# Patient Record
Sex: Female | Born: 1960 | Race: White | Hispanic: No | Marital: Married | Smoking: Former smoker
Health system: Southern US, Community
[De-identification: ages and names within clinical notes are randomized; demographics above are authoritative.]

## PROBLEM LIST (undated history)

## (undated) DIAGNOSIS — Z8489 Family history of other specified conditions: Secondary | ICD-10-CM

## (undated) DIAGNOSIS — T7840XA Allergy, unspecified, initial encounter: Secondary | ICD-10-CM

## (undated) DIAGNOSIS — I10 Essential (primary) hypertension: Secondary | ICD-10-CM

## (undated) DIAGNOSIS — N183 Chronic kidney disease, stage 3 unspecified: Secondary | ICD-10-CM

## (undated) DIAGNOSIS — R7303 Prediabetes: Secondary | ICD-10-CM

## (undated) DIAGNOSIS — M25539 Pain in unspecified wrist: Secondary | ICD-10-CM

## (undated) DIAGNOSIS — G4730 Sleep apnea, unspecified: Secondary | ICD-10-CM

## (undated) DIAGNOSIS — K219 Gastro-esophageal reflux disease without esophagitis: Secondary | ICD-10-CM

## (undated) DIAGNOSIS — M797 Fibromyalgia: Secondary | ICD-10-CM

## (undated) HISTORY — DX: Allergy, unspecified, initial encounter: T78.40XA

## (undated) HISTORY — DX: Pain in unspecified wrist: M25.539

## (undated) HISTORY — PX: FOOT SURGERY: SHX648

## (undated) HISTORY — PX: ECTOPIC PREGNANCY SURGERY: SHX613

## (undated) HISTORY — PX: WISDOM TOOTH EXTRACTION: SHX21

## (undated) HISTORY — PX: BREAST BIOPSY: SHX20

---

## 2014-03-17 ENCOUNTER — Ambulatory Visit: Payer: Self-pay | Admitting: Gastroenterology

## 2014-03-24 ENCOUNTER — Ambulatory Visit: Payer: Self-pay | Admitting: Family Medicine

## 2014-07-16 ENCOUNTER — Ambulatory Visit
Admission: RE | Admit: 2014-07-16 | Discharge: 2014-07-16 | Disposition: A | Discharge status: Home / Self Care | Payer: Commercial Indemnity | Source: Ambulatory Visit | Attending: Family Medicine | Admitting: Family Medicine

## 2014-07-16 ENCOUNTER — Ambulatory Visit (INDEPENDENT_AMBULATORY_CARE_PROVIDER_SITE_OTHER): Payer: Managed Care, Other (non HMO) | Admitting: Family Medicine

## 2014-07-16 ENCOUNTER — Ambulatory Visit
Admission: RE | Admit: 2014-07-16 | Discharge: 2014-07-16 | Disposition: A | Discharge status: Home / Self Care | Payer: Commercial Indemnity | Source: Other Acute Inpatient Hospital | Attending: Family Medicine | Admitting: Family Medicine

## 2014-07-16 ENCOUNTER — Encounter: Payer: Self-pay | Admitting: Family Medicine

## 2014-07-16 VITALS — BP 114/73 | HR 78 | Temp 98.9°F | Resp 16 | Ht 64.0 in | Wt 206.8 lb

## 2014-07-16 DIAGNOSIS — M25532 Pain in left wrist: Secondary | ICD-10-CM | POA: Diagnosis not present

## 2014-07-16 DIAGNOSIS — M797 Fibromyalgia: Secondary | ICD-10-CM | POA: Diagnosis not present

## 2014-07-16 DIAGNOSIS — G8929 Other chronic pain: Secondary | ICD-10-CM

## 2014-07-16 DIAGNOSIS — G4700 Insomnia, unspecified: Secondary | ICD-10-CM | POA: Insufficient documentation

## 2014-07-16 DIAGNOSIS — I10 Essential (primary) hypertension: Secondary | ICD-10-CM | POA: Diagnosis not present

## 2014-07-16 DIAGNOSIS — F32A Depression, unspecified: Secondary | ICD-10-CM

## 2014-07-16 DIAGNOSIS — F329 Major depressive disorder, single episode, unspecified: Secondary | ICD-10-CM

## 2014-07-16 DIAGNOSIS — I129 Hypertensive chronic kidney disease with stage 1 through stage 4 chronic kidney disease, or unspecified chronic kidney disease: Secondary | ICD-10-CM | POA: Insufficient documentation

## 2014-07-16 DIAGNOSIS — F339 Major depressive disorder, recurrent, unspecified: Secondary | ICD-10-CM | POA: Insufficient documentation

## 2014-07-16 DIAGNOSIS — N183 Chronic kidney disease, stage 3 unspecified: Secondary | ICD-10-CM | POA: Insufficient documentation

## 2014-07-16 DIAGNOSIS — F3341 Major depressive disorder, recurrent, in partial remission: Secondary | ICD-10-CM | POA: Insufficient documentation

## 2014-07-16 MED ORDER — TRAZODONE HCL 50 MG PO TABS: 25.0000 mg | ORAL_TABLET | Freq: Every evening | ORAL | Status: DC | PRN

## 2014-07-16 NOTE — Progress Notes (Signed)
Name: Rachel Townsend   MRN: [id number]    DOB: December 17, 1960   Date:07/16/2014       Progress Note  Subjective  Chief Complaint  Chief Complaint  Patient presents with  . Wrist Pain    Left side    HPI   L. Wrist pain for 2 months.  Minimal trauma (turning hand).  Pain beneath MC-C joint. Feels better with splint, but still bothers her.    Past Medical History  Diagnosis Date  . Allergy     History  Substance Use Topics  . Smoking status: Not on file  . Smokeless tobacco: Not on file  . Alcohol Use: Not on file     Current outpatient prescriptions:  .  baclofen (LIORESAL) 10 MG tablet, Take 10 mg by mouth 3 (three) times daily., Disp: , Rfl:  .  buPROPion (WELLBUTRIN XL) 300 MG 24 hr tablet, , Disp: , Rfl:  .  etodolac (LODINE XL) 500 MG 24 hr tablet, , Disp: , Rfl:  .  lisinopril (PRINIVIL,ZESTRIL) 5 MG tablet, , Disp: , Rfl:  .  sertraline (ZOLOFT) 100 MG tablet, Take 100 mg by mouth daily. Take 1 and 1/2 tablets each day., Disp: , Rfl:  .  traZODone (DESYREL) 50 MG tablet, Take 0.5-1 tablets (25-50 mg total) by mouth at bedtime as needed for sleep., Disp: 30 tablet, Rfl: 3  No Known Allergies  Review of Systems  Constitutional: Negative.  Negative for fever and chills.  Eyes: Negative.   Respiratory: Negative.  Negative for cough, sputum production, shortness of breath and wheezing.   Cardiovascular: Negative.  Negative for chest pain, palpitations, orthopnea, claudication and leg swelling.  Gastrointestinal: Negative.  Negative for heartburn, nausea, vomiting, abdominal pain and diarrhea.  Musculoskeletal: Positive for myalgias and joint pain.  Neurological: Headaches: /-  Psychiatric/Behavioral: The patient has insomnia.       Objective  Filed Vitals:   07/16/14 1526  BP: 114/73  Pulse: 78  Temp: 98.9 F (37.2 C)  TempSrc: Oral  Resp: 16  Height: 5\' 4"  (1.626 m)  Weight: 206 lb 12.8 oz (93.804 kg)     Physical Exam  Constitutional: She is  well-developed, well-nourished, and in no distress. No distress.  Musculoskeletal:       Right wrist: She exhibits tenderness and bony tenderness.       Arms: Vitals reviewed.       Assessment & Plan  1. Fibromyalgia Continue Etodolac and Baclofen   2. Depression Continue Zoloft and Wellbutrin     4. Essential hypertension Continue Liosinopril     3. Insomnia  - traZODone (DESYREL) 50 MG tablet; Take 0.5-1 tablets (25-50 mg total) by mouth at bedtime as needed for sleep.  Dispense: 30 tablet; Refill: 3     5. Wrist pain, chronic, left  - DG Wrist Complete Left; Future  Ice wrist 2-3 times a day Continue to wear Spica splint of wrist.

## 2014-07-17 ENCOUNTER — Telehealth: Payer: Self-pay | Admitting: Family Medicine

## 2014-07-17 NOTE — Telephone Encounter (Signed)
Pt called for results of wrist xray.

## 2014-07-18 NOTE — Progress Notes (Signed)
She may do what she wishes.  The Ortho referral is my recommendation.  She can discuss with me at future visit if she wishes.-jh

## 2014-07-23 NOTE — Telephone Encounter (Signed)
This encounter was created in error - please disregard.

## 2014-08-19 ENCOUNTER — Telehealth: Payer: Self-pay

## 2014-08-19 NOTE — Telephone Encounter (Signed)
Patient seen a few weeks ago and given a brace and told she will need to see Ortho. She said she will wear brace and see how it goes. She has now hurt the wrist by hitting it on something by accident and would like that referral to Ortho now. Is this ok and where do you think we should send her.

## 2014-08-19 NOTE — Telephone Encounter (Signed)
Yes, go ahead and refer her to [facility] ortho for both the knee and wrist.-jh

## 2014-08-22 NOTE — Telephone Encounter (Signed)
Will refer to [facility]. Advised patient they will call her.[facility]

## 2014-08-25 NOTE — Telephone Encounter (Signed)
This encounter was created in error - please disregard.

## 2014-08-26 NOTE — Telephone Encounter (Signed)
Done/JH 

## 2014-08-31 ENCOUNTER — Other Ambulatory Visit: Payer: Self-pay | Admitting: Family Medicine

## 2014-09-26 ENCOUNTER — Other Ambulatory Visit: Payer: Self-pay

## 2014-09-26 ENCOUNTER — Telehealth: Payer: Self-pay

## 2014-09-26 DIAGNOSIS — E785 Hyperlipidemia, unspecified: Secondary | ICD-10-CM

## 2014-09-26 MED ORDER — ATORVASTATIN CALCIUM 20 MG PO TABS: 20.0000 mg | ORAL_TABLET | Freq: Every day | ORAL | Status: DC

## 2014-09-26 NOTE — Telephone Encounter (Signed)
Advised that I sent 30 day RX with year refill to [facility] for Atorvastatin and patient requesting that after she pick that up we send 90 days in to [facility]. She will call and leave me VM as to when I can send it so she can get the first month with no hassle. She also is requesting that we send Etoldilac Rx refills now to [facility] so she can get that when she picks chol. Med up. She takes twice daily. Please send in or I will to [facility] 30 day only. Thanks

## 2014-09-26 NOTE — Telephone Encounter (Signed)
OK to send this to [facility].  This going back and forth between pharmacies is getting too complicated.-jh

## 2014-09-26 NOTE — Telephone Encounter (Deleted)
Was able to speak to patient and advised Rx was sent to Pharmacy. She said she

## 2014-09-26 NOTE — Telephone Encounter (Deleted)
Called to advise patient that I have sent in cholesterol med. Left detaile

## 2014-09-29 MED ORDER — ATORVASTATIN CALCIUM 20 MG PO TABS: 20.0000 mg | ORAL_TABLET | Freq: Every day | ORAL | Status: DC

## 2014-09-29 MED ORDER — ETODOLAC ER 500 MG PO TB24: 500.0000 mg | ORAL_TABLET | Freq: Every day | ORAL | Status: DC | PRN

## 2014-09-29 NOTE — Telephone Encounter (Signed)
Sent 90 days to mail order and sent 30 days Etoldilac to [facility]

## 2014-10-08 ENCOUNTER — Telehealth: Payer: Self-pay | Admitting: Family Medicine

## 2014-10-08 NOTE — Telephone Encounter (Signed)
Pt said a prescription for etodolac was sent to [facility].  It was only a 30 day supply and it needs to be 90 day supply.  The pharmacy told her someone could call [phone] to change it instead of sending a new prescription.

## 2014-10-08 NOTE — Telephone Encounter (Signed)
Changed to 90 day supply of 180 tabs. Called [facility]. [facility]

## 2014-10-27 ENCOUNTER — Other Ambulatory Visit: Payer: Self-pay | Admitting: Family Medicine

## 2014-11-24 ENCOUNTER — Ambulatory Visit (INDEPENDENT_AMBULATORY_CARE_PROVIDER_SITE_OTHER): Payer: Managed Care, Other (non HMO) | Admitting: Family Medicine

## 2014-11-24 ENCOUNTER — Encounter: Payer: Self-pay | Admitting: Family Medicine

## 2014-11-24 VITALS — BP 106/71 | HR 67 | Resp 16 | Ht 63.0 in | Wt 209.6 lb

## 2014-11-24 DIAGNOSIS — I10 Essential (primary) hypertension: Secondary | ICD-10-CM | POA: Diagnosis not present

## 2014-11-24 DIAGNOSIS — E669 Obesity, unspecified: Secondary | ICD-10-CM

## 2014-11-24 DIAGNOSIS — Z0000 Encounter for general adult medical examination without abnormal findings: Secondary | ICD-10-CM | POA: Diagnosis not present

## 2014-11-24 DIAGNOSIS — M797 Fibromyalgia: Secondary | ICD-10-CM

## 2014-11-24 DIAGNOSIS — G4700 Insomnia, unspecified: Secondary | ICD-10-CM | POA: Diagnosis not present

## 2014-11-24 DIAGNOSIS — F329 Major depressive disorder, single episode, unspecified: Secondary | ICD-10-CM | POA: Diagnosis not present

## 2014-11-24 DIAGNOSIS — F32A Depression, unspecified: Secondary | ICD-10-CM

## 2014-11-24 DIAGNOSIS — Z23 Encounter for immunization: Secondary | ICD-10-CM

## 2014-11-24 NOTE — Patient Instructions (Signed)
Fpem signed [facility].

## 2014-11-24 NOTE — Progress Notes (Signed)
Name: Rachel Townsend   MRN: [id number]    DOB: 06-06-1960   Date:11/24/2014       Progress Note  Subjective  Chief Complaint  Chief Complaint  Patient presents with  . Weight Check    [facility] Paper     HPI Here for evaluation re: weight and health insurance rates.   Has maintained weight over past year after a 25 pound loss from 2013.  Has fibromyalgia and depression. No problem-specific assessment & plan notes found for this encounter.   Past Medical History  Diagnosis Date  . Allergy   . Wrist pain     Social History  Substance Use Topics  . Smoking status: Former Smoker    Quit date: 11/23/1984  . Smokeless tobacco: Never Used  . Alcohol Use: 6.0 oz/week    10 Glasses of wine per week     Current outpatient prescriptions:  .  atorvastatin (LIPITOR) 20 MG tablet, Take 1 tablet (20 mg total) by mouth daily., Disp: 90 tablet, Rfl: 3 .  baclofen (LIORESAL) 10 MG tablet, Take 10 mg by mouth 3 (three) times daily., Disp: , Rfl:  .  Biotin 1 MG CAPS, Take by mouth., Disp: , Rfl:  .  buPROPion (WELLBUTRIN XL) 300 MG 24 hr tablet, Take 1 tablet by mouth  daily, Disp: 90 tablet, Rfl: 1 .  Calcium-Vitamin D 600-200 MG-UNIT tablet, Take by mouth., Disp: , Rfl:  .  etodolac (LODINE XL) 500 MG 24 hr tablet, Take 1 tablet (500 mg total) by mouth daily as needed., Disp: 60 tablet, Rfl: 0 .  FOLIC ACID PO, Take by mouth., Disp: , Rfl:  .  lisinopril (PRINIVIL,ZESTRIL) 5 MG tablet, , Disp: , Rfl:  .  loratadine (CLARITIN) 10 MG tablet, Take by mouth., Disp: , Rfl:  .  magnesium citrate (V-R MAGNESIUM CITRATE) SOLN, Take by mouth., Disp: , Rfl:  .  NIACIN CR PO, Take by mouth., Disp: , Rfl:  .  Omega-3 Fatty Acids (FISH OIL) 1000 MG CAPS, Take by mouth., Disp: , Rfl:  .  sertraline (ZOLOFT) 100 MG tablet, Take 100 mg by mouth daily. Take 1 and 1/2 tablets each day., Disp: , Rfl:  .  traZODone (DESYREL) 50 MG tablet, Take 0.5-1 tablets (25-50 mg total) by mouth at bedtime as needed for  sleep., Disp: 30 tablet, Rfl: 3  No Known Allergies  Review of Systems  Constitutional: Positive for malaise/fatigue. Negative for fever, chills and weight loss.  HENT: Negative for hearing loss.   Eyes: Negative for blurred vision and double vision.  Respiratory: Negative for cough, shortness of breath and wheezing.   Cardiovascular: Negative for chest pain, palpitations, orthopnea and leg swelling.  Gastrointestinal: Negative for heartburn, abdominal pain and blood in stool.  Genitourinary: Negative for dysuria, urgency and frequency.  Musculoskeletal: Positive for myalgias.  Neurological: Negative for weakness and headaches.      Objective  Filed Vitals:   11/24/14 1316  BP: 106/71  Pulse: 67  Resp: 16  Height: 5\' 3"  (1.6 m)  Weight: 209 lb 9.6 oz (95.074 kg)     Physical Exam  Constitutional: She is well-developed, well-nourished, and in no distress. No distress.  HENT:  Head: Normocephalic and atraumatic.  Neck: Normal range of motion. Neck supple. Carotid bruit is not present. No thyromegaly present.  Cardiovascular: Normal rate, regular rhythm, normal heart sounds and intact distal pulses.  Exam reveals no gallop and no friction rub.   No murmur heard. Pulmonary/Chest: Effort normal  and breath sounds normal. No respiratory distress. She has no wheezes. She has no rales.  Abdominal: Soft. Bowel sounds are normal. She exhibits no distension and no mass. There is no tenderness.  Musculoskeletal: She exhibits no edema.  Lymphadenopathy:    She has no cervical adenopathy.  Vitals reviewed.     No results found for this or any previous visit (from the past 2160 hour(s)).   Assessment & Plan  1. Periodic health assessment, general screening, adult   2. Need for influenza vaccination  - Flu Vaccine QUAD 36+ mos PF IM (Fluarix & Fluzone Quad PF)  3. Need for Tdap vaccination  - Tdap vaccine greater than or equal to 7yo IM  4. Essential hypertension   5.  Fibromyalgia   6. Depression   7. Insomnia   8. Obesity  - Amb ref to Medical Nutrition Therapy-[facility]

## 2014-12-22 ENCOUNTER — Encounter: Payer: Commercial Indemnity | Attending: Family Medicine | Admitting: Dietician

## 2014-12-22 VITALS — Ht 64.0 in | Wt 211.6 lb

## 2014-12-22 DIAGNOSIS — M797 Fibromyalgia: Secondary | ICD-10-CM

## 2014-12-22 DIAGNOSIS — E669 Obesity, unspecified: Secondary | ICD-10-CM | POA: Diagnosis present

## 2014-12-22 DIAGNOSIS — F32A Depression, unspecified: Secondary | ICD-10-CM

## 2014-12-22 DIAGNOSIS — F329 Major depressive disorder, single episode, unspecified: Secondary | ICD-10-CM

## 2014-12-22 DIAGNOSIS — I10 Essential (primary) hypertension: Secondary | ICD-10-CM

## 2014-12-22 NOTE — Progress Notes (Signed)
Medical Nutrition Therapy: Visit start time: 1400  end time: 1500  Assessment:  Diagnosis: obesity Past medical history: HTN, hyperlipidemia, sleep apnea, fibromyalgia and arthritis Psychosocial issues/ stress concerns: history of depression Preferred learning method:  . Auditory . Visual  Current weight: 211.6lbs  Height: 5'3" Medications, supplements: reviewed list in chart with patient  Progress and evaluation: Patient reports struggling to manage weight since middle school, has participated in Weight Watchers several times, starting in 6th grade.          She and her husband have worked on [employer] recently.          She reports knowing what to do in terms of healthy eating, but feeling lack of motivation recently.         Physical activity is difficult due to pain  Physical activity: none, some activity at work, does most housework, husband unable physically  Dietary Intake:  Usual eating pattern includes 3 meals and 1-2 snacks per day. Dining out frequency: 5-10 meals per week.  Breakfast: coffee 1/2pot half-caff with sweet-n-low (4pks total). 2pcs [place] sausage on sandwich thin; or steak and egg biscuit, diet dr. 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    Prepares meat and vegetable meal for husband when he gets home 8pm, but she is too hungry to wait until then to eat.   Snack: none Beverages: water, diet drinks, drinks plenty of fluids per patient  Nutrition Care Education: Topics covered: weight management Basic nutrition: appropriate nutrient balance, general nutrition guidelines    Weight control:  behavioral changes for weight loss, 1300kcal meal plan  with 50% CHO, 20% protein, 30% fat; importance of low fat and low sugar foods, portion control Hypertension and hyperlipidemia, inflammation:  identifying food sources of potassium, magnesium, phytochemicals: importance of increasing veg and fruit intake Other lifestyle changes: increasing motivation, readiness for change  Nutritional Diagnosis:  Alexander-3.3 Overweight/obesity As related to excess calorie intake, inactivity related to pain, depression.  As evidenced by patient report.  Intervention: Instruction as noted above.   Encouraged patient to find a motivator such as reduced inflammation to make diet changes.   Did set some goals with patient input   She declined scheduling follow-up at this time, wants to work on depression and motivation for now.         Education Materials given:  . Food lists/ Planning A Balanced Meal . Goals/ instructions  Learner/ who was taught:  . Patient    Level of understanding: Marland Kitchen Verbalizes/ demonstrates competency  Demonstrated degree of understanding via:   Teach back Learning barriers: . None   Willingness to learn/ readiness for change: . Hesitance, contemplating change   Monitoring and Evaluation:  Dietary intake, exercise, pain, and body weight      follow up: prn

## 2014-12-22 NOTE — Patient Instructions (Signed)
   Choose salads some days for lunches.  Prepare [place] sandwiches to take to work, along with fruits and celery, carrots/ other veggies.   Water is the healthiest drink, followed by tea and low-caff coffee, flavored waters. Avoid regular sodas and limit diet sodas to 1-2 per day.

## 2014-12-30 ENCOUNTER — Other Ambulatory Visit: Payer: Self-pay | Admitting: Family Medicine

## 2015-03-02 ENCOUNTER — Encounter: Payer: Self-pay | Admitting: Family Medicine

## 2015-03-02 ENCOUNTER — Ambulatory Visit (INDEPENDENT_AMBULATORY_CARE_PROVIDER_SITE_OTHER): Payer: Managed Care, Other (non HMO) | Admitting: Family Medicine

## 2015-03-02 VITALS — BP 127/85 | HR 76 | Temp 98.3°F | Resp 16 | Ht 63.0 in | Wt 210.8 lb

## 2015-03-02 DIAGNOSIS — F329 Major depressive disorder, single episode, unspecified: Secondary | ICD-10-CM

## 2015-03-02 DIAGNOSIS — I10 Essential (primary) hypertension: Secondary | ICD-10-CM | POA: Diagnosis not present

## 2015-03-02 DIAGNOSIS — E669 Obesity, unspecified: Secondary | ICD-10-CM

## 2015-03-02 DIAGNOSIS — M797 Fibromyalgia: Secondary | ICD-10-CM

## 2015-03-02 DIAGNOSIS — F32A Depression, unspecified: Secondary | ICD-10-CM

## 2015-03-02 MED ORDER — SERTRALINE HCL 100 MG PO TABS: ORAL_TABLET | ORAL | Status: DC

## 2015-03-02 NOTE — Patient Instructions (Signed)
Plan to cont. To decrease Zoloft if it helps weight.

## 2015-03-02 NOTE — Progress Notes (Signed)
Name: Rachel Townsend   MRN: [id number]    DOB: Aug 28, 1960   Date:03/02/2015       Progress Note  Subjective  Chief Complaint  Chief Complaint  Patient presents with  . Weight Check    HPI Here to discuss weight.  [facility] but did not feel that it did any good.  Holidays and extra stress at home and work making things harder for her to ty to diet.  She does not know that she feels any better on 150 of Zoloft than she did on 100.    No problem-specific assessment & plan notes found for this encounter.   Past Medical History  Diagnosis Date  . Allergy   . Wrist pain     Past Surgical History  Procedure Laterality Date  . Ectopic pregnancy surgery    . Foot surgery      Family History  Problem Relation Age of Onset  . Cancer Mother     breast cancer  . Hypertension Mother   . Diabetes Mother   . Hyperlipidemia Mother   . Hypertension Father   . Hyperlipidemia Father     Social History   Social History  . Marital Status: Married    Spouse Name: N/A  . Number of Children: N/A  . Years of Education: N/A   Occupational History  . Not on file.   Social History Main Topics  . Smoking status: Former Smoker    Quit date: 11/23/1984  . Smokeless tobacco: Never Used  . Alcohol Use: 6.0 oz/week    10 Glasses of wine per week  . Drug Use: No  . Sexual Activity: Yes    Birth Control/ Protection: Injection   Other Topics Concern  . Not on file   Social History Narrative     Current outpatient prescriptions:  .  atorvastatin (LIPITOR) 20 MG tablet, Take 1 tablet (20 mg total) by mouth daily., Disp: 90 tablet, Rfl: 3 .  b complex vitamins tablet, Take 1 tablet by mouth daily., Disp: , Rfl:  .  Biotin 1 MG CAPS, Take by mouth., Disp: , Rfl:  .  buPROPion (WELLBUTRIN XL) 300 MG 24 hr tablet, Take 1 tablet by mouth  daily, Disp: 90 tablet, Rfl: 1 .  Calcium-Vitamin D 600-200 MG-UNIT tablet, Take 1 tablet by mouth 2 (two)  times daily. , Disp: , Rfl:  .  Cholecalciferol (VITAMIN D-1000 MAX ST) 1000 units tablet, Take 1,000 Units by mouth daily., Disp: , Rfl:  .  co-enzyme Q-10 50 MG capsule, Take 100 mg by mouth daily., Disp: , Rfl:  .  etodolac (LODINE XL) 500 MG 24 hr tablet, Take 1 tablet (500 mg total) by mouth daily as needed., Disp: 60 tablet, Rfl: 0 .  FOLIC ACID PO, Take 1 tablet by mouth daily. , Disp: , Rfl:  .  lisinopril (PRINIVIL,ZESTRIL) 5 MG tablet, Take 5 mg by mouth daily. , Disp: , Rfl:  .  loratadine (CLARITIN) 10 MG tablet, Take 10 mg by mouth daily. , Disp: , Rfl:  .  NIACIN CR PO, Take 1 tablet by mouth daily. , Disp: , Rfl:  .  Omega-3 Fatty Acids (FISH OIL) 1000 MG CAPS, Take 1,200 mg by mouth daily. , Disp: , Rfl:  .  sertraline (ZOLOFT) 100 MG tablet, Take 1 tablet in the AM each day, Disp: 90 tablet, Rfl: 3 .  baclofen (LIORESAL) 10 MG tablet, Take 10 mg by mouth 3 (  three) times daily. Reported on 03/02/2015, Disp: , Rfl:  .  traZODone (DESYREL) 50 MG tablet, Take 0.5-1 tablets (25-50 mg total) by mouth at bedtime as needed for sleep. (Patient not taking: Reported on 12/22/2014), Disp: 30 tablet, Rfl: 3  Not on File   Review of Systems  Constitutional: Negative for fever, chills, weight loss and malaise/fatigue.  HENT: Negative for hearing loss.   Eyes: Negative for blurred vision and double vision.  Respiratory: Negative for cough, shortness of breath and wheezing.   Cardiovascular: Negative for chest pain, palpitations and leg swelling.  Gastrointestinal: Negative for heartburn, abdominal pain and blood in stool.  Genitourinary: Negative for dysuria, urgency and frequency.  Musculoskeletal: Positive for myalgias (fibromyalgia doing fairly well at present.).  Skin: Negative for rash.  Neurological: Negative for tremors, weakness and headaches.  Psychiatric/Behavioral: Positive for depression. The patient is nervous/anxious and has insomnia (off and on.).        Objective  Filed Vitals:   03/02/15 1552  BP: 127/85  Pulse: 76  Temp: 98.3 F (36.8 C)  TempSrc: Oral  Resp: 16  Height: 5\' 3"  (1.6 m)  Weight: 210 lb 12.8 oz (95.618 kg)    Physical Exam  Constitutional: She is oriented to person, place, and time and well-developed, well-nourished, and in no distress. No distress.  HENT:  Head: Normocephalic and atraumatic.  Eyes: Conjunctivae and EOM are normal. Pupils are equal, round, and reactive to light. No scleral icterus.  Neck: Normal range of motion. Neck supple. Carotid bruit is not present. No thyromegaly present.  Cardiovascular: Normal rate, regular rhythm and normal heart sounds.  Exam reveals no gallop and no friction rub.   No murmur heard. Pulmonary/Chest: Effort normal and breath sounds normal. No respiratory distress. She has no wheezes. She has no rales.  Musculoskeletal: She exhibits no edema.  Lymphadenopathy:    She has no cervical adenopathy.  Neurological: She is alert and oriented to person, place, and time.  Multip(le fibromyalgia trigger points still tender.  Psychiatric:  Affect is stable  Vitals reviewed.      No results found for this or any previous visit (from the past 2160 hour(s)).   Assessment & Plan  Problem List Items Addressed This Visit      Cardiovascular and Mediastinum   Hypertension     Musculoskeletal and Integument   Fibromyalgia   Relevant Medications   sertraline (ZOLOFT) 100 MG tablet     Other   Depression   Relevant Medications   sertraline (ZOLOFT) 100 MG tablet   Obesity - Primary      Meds ordered this encounter  Medications  . Cholecalciferol (VITAMIN D-1000 MAX ST) 1000 units tablet    Sig: Take 1,000 Units by mouth daily.  Marland Kitchen DISCONTD: Calcium Carbonate-Vitamin D 600-400 MG-UNIT tablet    Sig: Take 1 tablet by mouth daily. Reported on 03/02/2015  . DISCONTD: Multiple Vitamins-Minerals (B COMPLEX PLUS VITAMIN C PO)    Sig: Take 1 tablet by mouth daily.  Reported on 03/02/2015  . sertraline (ZOLOFT) 100 MG tablet    Sig: Take 1 tablet in the AM each day    Dispense:  90 tablet    Refill:  3   1. Obesity Decreased Zoloft from 150 mg/d to 100 mg/d.  2. Fibromyalgia  - sertraline (ZOLOFT) 100 MG tablet; Take 1 tablet in the AM each day  Dispense: 90 tablet; Refill: 3  (dose lower)  3. Depression  - sertraline (ZOLOFT) 100 MG tablet;  Take 1 tablet in the AM each day  Dispense: 90 tablet; Refill: 3  (dose lower)  4. Essential hypertension Cont. med

## 2015-04-13 ENCOUNTER — Ambulatory Visit (INDEPENDENT_AMBULATORY_CARE_PROVIDER_SITE_OTHER): Payer: Managed Care, Other (non HMO) | Admitting: Family Medicine

## 2015-04-13 ENCOUNTER — Encounter: Payer: Self-pay | Admitting: Family Medicine

## 2015-04-13 VITALS — BP 107/66 | HR 82 | Resp 16 | Ht 63.0 in | Wt 205.0 lb

## 2015-04-13 DIAGNOSIS — M797 Fibromyalgia: Secondary | ICD-10-CM | POA: Diagnosis not present

## 2015-04-13 DIAGNOSIS — G4700 Insomnia, unspecified: Secondary | ICD-10-CM

## 2015-04-13 DIAGNOSIS — F329 Major depressive disorder, single episode, unspecified: Secondary | ICD-10-CM | POA: Diagnosis not present

## 2015-04-13 DIAGNOSIS — F32A Depression, unspecified: Secondary | ICD-10-CM

## 2015-04-13 MED ORDER — GABAPENTIN 100 MG PO CAPS: ORAL_CAPSULE | ORAL | Status: DC

## 2015-04-13 NOTE — Patient Instructions (Signed)
Take 1-2, 81 mg. ASA tabs, 1/2 hr before taking Niacin at night.

## 2015-04-13 NOTE — Progress Notes (Signed)
Name: Rachel Townsend   MRN: [id number]    DOB: 1960-05-12   Date:04/13/2015       Progress Note  Subjective  Chief Complaint  Chief Complaint  Patient presents with  . Muscle Pain  . Hot Flashes    HPI Here for f/u of fibromyalgia pain.  Hurts "all over" at this time.  Feels weak in arms and legs.  Throbbing pain in feet, legs, arms.   Also with hot flashes at night.  But takes Niacin at night without ASA previous.    No problem-specific assessment & plan notes found for this encounter.   Past Medical History  Diagnosis Date  . Allergy   . Wrist pain     Past Surgical History  Procedure Laterality Date  . Ectopic pregnancy surgery    . Foot surgery      Family History  Problem Relation Age of Onset  . Cancer Mother     breast cancer  . Hypertension Mother   . Diabetes Mother   . Hyperlipidemia Mother   . Hypertension Father   . Hyperlipidemia Father     Social History   Social History  . Marital Status: Married    Spouse Name: N/A  . Number of Children: N/A  . Years of Education: N/A   Occupational History  . Not on file.   Social History Main Topics  . Smoking status: Former Smoker    Quit date: 11/23/1984  . Smokeless tobacco: Never Used  . Alcohol Use: 6.0 oz/week    10 Glasses of wine per week  . Drug Use: No  . Sexual Activity: Yes    Birth Control/ Protection: Injection   Other Topics Concern  . Not on file   Social History Narrative     Current outpatient prescriptions:  .  Acetaminophen (TYLENOL ARTHRITIS PAIN PO), Take by mouth., Disp: , Rfl:  .  atorvastatin (LIPITOR) 20 MG tablet, Take 1 tablet (20 mg total) by mouth daily., Disp: 90 tablet, Rfl: 3 .  b complex vitamins tablet, Take 1 tablet by mouth daily., Disp: , Rfl:  .  Biotin 1 MG CAPS, Take by mouth., Disp: , Rfl:  .  buPROPion (WELLBUTRIN XL) 300 MG 24 hr tablet, , Disp: , Rfl:  .  Calcium-Vitamin D 600-200 MG-UNIT tablet, Take 1 tablet by mouth 2 (two) times daily. , Disp: ,  Rfl:  .  Cholecalciferol (VITAMIN D-1000 MAX ST) 1000 units tablet, Take 1,000 Units by mouth daily., Disp: , Rfl:  .  co-enzyme Q-10 50 MG capsule, Take 100 mg by mouth daily., Disp: , Rfl:  .  diphenhydramine-acetaminophen (TYLENOL PM) 25-500 MG TABS tablet, Take 1 tablet by mouth at bedtime as needed., Disp: , Rfl:  .  etodolac (LODINE XL) 500 MG 24 hr tablet, Take 1 tablet (500 mg total) by mouth daily as needed., Disp: 60 tablet, Rfl: 0 .  FOLIC ACID PO, Take 1 tablet by mouth daily. , Disp: , Rfl:  .  lisinopril (PRINIVIL,ZESTRIL) 5 MG tablet, Take 5 mg by mouth daily. , Disp: , Rfl:  .  loratadine (CLARITIN) 10 MG tablet, Take 10 mg by mouth daily. , Disp: , Rfl:  .  magnesium citrate SOLN, Take 1 Bottle by mouth once., Disp: , Rfl:  .  NIACIN CR PO, Take 1 tablet by mouth daily. , Disp: , Rfl:  .  Omega-3 Fatty Acids (FISH OIL) 1000 MG CAPS, Take 1,200 mg by mouth daily. , Disp: , Rfl:  .  sertraline (ZOLOFT) 100 MG tablet, Take 1 tablet in the AM each day, Disp: 90 tablet, Rfl: 3 .  gabapentin (NEURONTIN) 100 MG capsule, Take 3 capsules three times a day, Disp: 270 capsule, Rfl: 6  Not on File   Review of Systems  Constitutional: Negative for fever, chills, weight loss and malaise/fatigue.  HENT: Negative for hearing loss.   Eyes: Negative for blurred vision and double vision.  Respiratory: Negative for cough, shortness of breath and wheezing.   Cardiovascular: Negative for chest pain, palpitations and leg swelling.  Gastrointestinal: Negative for heartburn, abdominal pain and constipation.  Genitourinary: Negative for dysuria, urgency and frequency.  Musculoskeletal: Positive for myalgias.  Skin: Negative for rash.  Neurological: Negative for dizziness, tremors, weakness and headaches.  Psychiatric/Behavioral: Positive for depression. The patient has insomnia.       Objective  Filed Vitals:   04/13/15 1320  BP: 107/66  Pulse: 82  Resp: 16  Height: 5\' 3"  (1.6 m)   Weight: 205 lb (92.987 kg)    Physical Exam  Constitutional: She is oriented to person, place, and time and well-developed, well-nourished, and in no distress. No distress.  HENT:  Head: Normocephalic and atraumatic.  Eyes: Conjunctivae are normal. Pupils are equal, round, and reactive to light. No scleral icterus.  Neck: Normal range of motion. Neck supple. Carotid bruit is not present. No thyromegaly present.  Cardiovascular: Normal rate, regular rhythm and normal heart sounds.  Exam reveals no gallop and no friction rub.   No murmur heard. Pulmonary/Chest: Effort normal and breath sounds normal. No respiratory distress. She has no wheezes. She has no rales.  Musculoskeletal: She exhibits no edema.  Trigger point pain to all trigger point locales.  Moves very slowly .  Lymphadenopathy:    She has no cervical adenopathy.  Neurological: She is alert and oriented to person, place, and time.  Vitals reviewed.      No results found for this or any previous visit (from the past 2160 hour(s)).   Assessment & Plan  Problem List Items Addressed This Visit      Musculoskeletal and Integument   Fibromyalgia - Primary   Relevant Medications   gabapentin (NEURONTIN) 100 MG capsule     Other   Depression   Relevant Medications   buPROPion (WELLBUTRIN XL) 300 MG 24 hr tablet   Insomnia      Meds ordered this encounter  Medications  . magnesium citrate SOLN    Sig: Take 1 Bottle by mouth once.  . Acetaminophen (TYLENOL ARTHRITIS PAIN PO)    Sig: Take by mouth.  . diphenhydramine-acetaminophen (TYLENOL PM) 25-500 MG TABS tablet    Sig: Take 1 tablet by mouth at bedtime as needed.  Marland Kitchen buPROPion (WELLBUTRIN XL) 300 MG 24 hr tablet    Sig:   . gabapentin (NEURONTIN) 100 MG capsule    Sig: Take 3 capsules three times a day    Dispense:  270 capsule    Refill:  6   1. Fibromyalgia  - gabapentin (NEURONTIN) 100 MG capsule; Take 3 capsules three times a day  Dispense: 270  capsule; Refill: 6  2. Depression Cont meds  3. Insomnia

## 2015-04-20 ENCOUNTER — Ambulatory Visit
Admission: EM | Admit: 2015-04-20 | Discharge: 2015-04-20 | Disposition: A | Discharge status: Home / Self Care | Payer: Managed Care, Other (non HMO) | Attending: Emergency Medicine | Admitting: Emergency Medicine

## 2015-04-20 DIAGNOSIS — M5441 Lumbago with sciatica, right side: Secondary | ICD-10-CM

## 2015-04-20 HISTORY — DX: Essential (primary) hypertension: I10

## 2015-04-20 HISTORY — DX: Fibromyalgia: M79.7

## 2015-04-20 MED ORDER — METHYLPREDNISOLONE SODIUM SUCC 125 MG IJ SOLR
125.0000 mg | Freq: Once | INTRAMUSCULAR | Status: AC
  Administered 2015-04-20: 125 mg via INTRAMUSCULAR

## 2015-04-20 MED ORDER — METAXALONE 800 MG PO TABS: 800.0000 mg | ORAL_TABLET | Freq: Three times a day (TID) | ORAL | Status: DC

## 2015-04-20 NOTE — ED Notes (Signed)
Reporting right side mostly sciatica pain.  Started about a week ago.  Prescribed meds and OTCs are not cutting pain at moment.  Here for further.

## 2015-04-20 NOTE — ED Provider Notes (Signed)
CSN: [id number]     Arrival date & time 04/20/15  1025 History   First MD Initiated Contact with Patient 04/20/15 1233     Chief Complaint  Patient presents with  . Sciatica   (Consider location/radiation/quality/duration/timing/severity/associated sxs/prior Treatment) HPI   This a 55 year old female who presents with right-sided sciatica that started approximately 1 week ago. This is been a chronic problem for her and she has been using etodolac and Flexeril without much relief. 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 She has to drive the plan to [facility] facility she is barely able to walk when she exits the [place]. This is been a very chronic problem for her. She has no incontinence and has no loss of function.  Past Medical History  Diagnosis Date  . Allergy   . Wrist pain   . Fibromyalgia   . Hypertension    Past Surgical History  Procedure Laterality Date  . Ectopic pregnancy surgery    . Foot surgery     Family History  Problem Relation Age of Onset  . Cancer Mother     breast cancer  . Hypertension Mother   . Diabetes Mother   . Hyperlipidemia Mother   . Hypertension Father   . Hyperlipidemia Father    Social History  Substance Use Topics  . Smoking status: Former Smoker    Quit date: 11/23/1984  . Smokeless tobacco: Never Used  . Alcohol Use: 6.0 oz/week    10 Glasses of wine per week   OB History    No data available     Review of Systems  Constitutional: Positive for activity change. Negative for fever, chills and fatigue.  Musculoskeletal: Positive for myalgias and back pain.  All other systems reviewed and are negative.   Allergies  Review of patient's allergies indicates no known allergies.  Home Medications   Prior to Admission medications   Medication Sig Start Date End Date Taking? Authorizing Provider  Acetaminophen  (TYLENOL ARTHRITIS PAIN PO) Take by mouth.    Historical Provider, MD  atorvastatin (LIPITOR) 20 MG tablet Take 1 tablet (20 mg total) by mouth daily. 09/29/14   Arlis Porta., MD  b complex vitamins tablet Take 1 tablet by mouth daily.    Historical Provider, MD  Biotin 1 MG CAPS Take by mouth.    Historical Provider, MD  buPROPion (WELLBUTRIN XL) 300 MG 24 hr tablet  03/06/15   Historical Provider, MD  Calcium-Vitamin D 600-200 MG-UNIT tablet Take 1 tablet by mouth 2 (two) times daily.     Historical Provider, MD  Cholecalciferol (VITAMIN D-1000 MAX ST) 1000 units tablet Take 1,000 Units by mouth daily.    Historical Provider, MD  co-enzyme Q-10 50 MG capsule Take 100 mg by mouth daily.    Historical Provider, MD  diphenhydramine-acetaminophen (TYLENOL PM) 25-500 MG TABS tablet Take 1 tablet by mouth at bedtime as needed.    Historical Provider, MD  etodolac (LODINE XL) 500 MG 24 hr tablet Take 1 tablet (500 mg total) by mouth daily as needed. 09/29/14   Arlis Porta., MD  FOLIC ACID PO Take 1 tablet by mouth daily.     Historical Provider, MD  gabapentin (NEURONTIN) 100 MG capsule Take 3 capsules three times a day 04/13/15   Arlis Porta., MD  lisinopril (PRINIVIL,ZESTRIL) 5  MG tablet Take 5 mg by mouth daily.  04/14/14   Historical Provider, MD  loratadine (CLARITIN) 10 MG tablet Take 10 mg by mouth daily.     Historical Provider, MD  magnesium citrate SOLN Take 1 Bottle by mouth once.    Historical Provider, MD  metaxalone (SKELAXIN) 800 MG tablet Take 1 tablet (800 mg total) by mouth 3 (three) times daily. 04/20/15   Lorin Picket, PA-C  NIACIN CR PO Take 1 tablet by mouth daily.     Historical Provider, MD  Omega-3 Fatty Acids (FISH OIL) 1000 MG CAPS Take 1,200 mg by mouth daily.     Historical Provider, MD  sertraline (ZOLOFT) 100 MG tablet Take 1 tablet in the AM each day 03/02/15   Arlis Porta., MD   Meds Ordered and Administered this Visit   Medications   methylPREDNISolone sodium succinate (SOLU-MEDROL) 125 mg/2 mL injection 125 mg (125 mg Intramuscular Given 04/20/15 1314)    BP 137/90 mmHg  Pulse 82  Temp(Src) 98.1 F (36.7 C) (Oral)  Resp 16  SpO2 100%  LMP  (Approximate) No data found.   Physical Exam  Constitutional: She is oriented to person, place, and time. She appears well-developed and well-nourished. No distress.  HENT:  Head: Normocephalic and atraumatic.  Eyes: Conjunctivae are normal. Pupils are equal, round, and reactive to light.  Neck: Normal range of motion. Neck supple.  Musculoskeletal:  Examination of lumbar spine shows a level pelvis in stance. Forward flexion and lateral flexion are fluid. He is able to toe and heel walk adequately. Sensation is intact to light touch in the lower extremities. DTRs are 2+ over 4 and bilaterally symmetrical at the knee and ankles. There is no clonus present leg raise testing is negative at 90 bilaterally but on the right patient has complaints of lateral calf posterior thigh and right buttock pain. EHL peroneal and anterior tibialis are strong to clinical testing.  Neurological: She is alert and oriented to person, place, and time. She has normal reflexes. She displays normal reflexes. She exhibits normal muscle tone. Coordination normal.  Skin: Skin is warm and dry. She is not diaphoretic.  Psychiatric: She has a normal mood and affect. Her behavior is normal. Judgment and thought content normal.  Nursing note and vitals reviewed.   ED Course  Procedures (including critical care time)  Labs Review Labs Reviewed - No data to display  Imaging Review No results found.   Visual Acuity Review  Right Eye Distance:   Left Eye Distance:   Bilateral Distance:    Right Eye Near:   Left Eye Near:    Bilateral Near:      13:14 Medication Given GB  methylPREDNISolone sodium succinate (SOLU-MEDROL) 125 mg/2 mL injection 125 mg - Dose: 125 mg ; Route: Intramuscular ; Site:  Right Ventrogluteal ; Scheduled Time: 13        MDM   1. Right-sided low back pain with right-sided sciatica    New Prescriptions   METAXALONE (SKELAXIN) 800 MG TABLET    Take 1 tablet (800 mg total) by mouth 3 (three) times daily.  Plan: 1. Test/x-ray results and diagnosis reviewed with patient 2. rx as per orders; risks, benefits, potential side effects reviewed with patient 3. Recommend supportive treatment with Rest and symptom avoidance. I've explained the sitting lifting and bending are her worst activities. We will try to substitute Skelaxin for the Flexeril as a better parent forms muscle relaxer. Continue. Etodolac. She should  follow-up with her primary care physician as necessary. 4. F/u prn if symptoms worsen or don't improve     Lorin Picket, PA-C 04/20/15 1320

## 2015-04-20 NOTE — ED Notes (Signed)
Ambulatory to treatment room.  Gait normal.  In NAD.

## 2015-04-20 NOTE — Discharge Instructions (Signed)

## 2015-04-24 ENCOUNTER — Other Ambulatory Visit: Payer: Self-pay | Admitting: Family Medicine

## 2015-05-14 ENCOUNTER — Ambulatory Visit (INDEPENDENT_AMBULATORY_CARE_PROVIDER_SITE_OTHER): Payer: Managed Care, Other (non HMO) | Admitting: Family Medicine

## 2015-05-14 ENCOUNTER — Encounter: Payer: Self-pay | Admitting: Family Medicine

## 2015-05-14 VITALS — BP 117/77 | HR 80 | Temp 98.6°F | Resp 16 | Ht 64.0 in | Wt 203.0 lb

## 2015-05-14 DIAGNOSIS — M797 Fibromyalgia: Secondary | ICD-10-CM

## 2015-05-14 DIAGNOSIS — M5430 Sciatica, unspecified side: Secondary | ICD-10-CM | POA: Insufficient documentation

## 2015-05-14 DIAGNOSIS — M5431 Sciatica, right side: Secondary | ICD-10-CM

## 2015-05-14 MED ORDER — METAXALONE 800 MG PO TABS: 800.0000 mg | ORAL_TABLET | Freq: Three times a day (TID) | ORAL | Status: DC

## 2015-05-14 MED ORDER — GABAPENTIN 400 MG PO CAPS: ORAL_CAPSULE | ORAL | Status: DC

## 2015-05-14 NOTE — Progress Notes (Signed)
Name: Rachel Townsend   MRN: [id number]    DOB: Jun 01, 1960   Date:05/14/2015       Progress Note  Subjective  Chief Complaint  Chief Complaint  Patient presents with  . Leg Pain    right    Leg Pain    Here c/o R leg pain on and off for 1 month.  Can hurt with walklng or with laying down.  Can hurt with sleep also.  Worse with minimal trauma (mis-stepped).    Pain is located in R buttock and radiates sown R leg to heel.  Gabapentin has helped it feel better, bujt still worse on and off.  No loss of bowel or bladder controil.  No foot drop  No problem-specific assessment & plan notes found for this encounter.   Past Medical History  Diagnosis Date  . Allergy   . Wrist pain   . Fibromyalgia   . Hypertension     Social History  Substance Use Topics  . Smoking status: Former Smoker    Quit date: 11/23/1984  . Smokeless tobacco: Never Used  . Alcohol Use: 6.0 oz/week    10 Glasses of wine per week     Current outpatient prescriptions:  .  Acetaminophen (TYLENOL ARTHRITIS PAIN PO), Take by mouth., Disp: , Rfl:  .  atorvastatin (LIPITOR) 20 MG tablet, Take 1 tablet (20 mg total) by mouth daily., Disp: 90 tablet, Rfl: 3 .  b complex vitamins tablet, Take 1 tablet by mouth daily., Disp: , Rfl:  .  Biotin 1 MG CAPS, Take by mouth., Disp: , Rfl:  .  buPROPion (WELLBUTRIN XL) 300 MG 24 hr tablet, Take 1 tablet by mouth  daily, Disp: 90 tablet, Rfl: 3 .  Calcium-Vitamin D 600-200 MG-UNIT tablet, Take 1 tablet by mouth 2 (two) times daily. , Disp: , Rfl:  .  Cholecalciferol (VITAMIN D-1000 MAX ST) 1000 units tablet, Take 1,000 Units by mouth daily., Disp: , Rfl:  .  co-enzyme Q-10 50 MG capsule, Take 100 mg by mouth daily., Disp: , Rfl:  .  diphenhydramine-acetaminophen (TYLENOL PM) 25-500 MG TABS tablet, Take 1 tablet by mouth at bedtime as needed., Disp: , Rfl:  .  etodolac (LODINE XL) 500 MG 24 hr tablet, Take 1 tablet (500 mg total) by mouth daily as needed., Disp: 60 tablet,  Rfl: 0 .  FOLIC ACID PO, Take 1 tablet by mouth daily. , Disp: , Rfl:  .  gabapentin (NEURONTIN) 100 MG capsule, Take 3 capsules three times a day, Disp: 270 capsule, Rfl: 6 .  lisinopril (PRINIVIL,ZESTRIL) 5 MG tablet, Take 1 tablet by mouth  daily, Disp: 90 tablet, Rfl: 3 .  loratadine (CLARITIN) 10 MG tablet, Take 10 mg by mouth daily. , Disp: , Rfl:  .  magnesium citrate SOLN, Take 1 Bottle by mouth once., Disp: , Rfl:  .  metaxalone (SKELAXIN) 800 MG tablet, Take 1 tablet (800 mg total) by mouth 3 (three) times daily., Disp: 21 tablet, Rfl: 0 .  NIACIN CR PO, Take 1 tablet by mouth daily. , Disp: , Rfl:  .  Omega-3 Fatty Acids (FISH OIL) 1000 MG CAPS, Take 1,200 mg by mouth daily. , Disp: , Rfl:  .  sertraline (ZOLOFT) 100 MG tablet, Take 1 tablet in the AM each day, Disp: 90 tablet, Rfl: 3  Not on File  Review of Systems  Constitutional: Negative for fever, chills, weight loss and malaise/fatigue.  HENT: Negative for hearing loss.   Eyes: Negative for  blurred vision and double vision.  Respiratory: Negative for cough, shortness of breath and wheezing.   Cardiovascular: Negative for chest pain, palpitations and leg swelling.  Gastrointestinal: Negative for heartburn, abdominal pain and blood in stool.  Genitourinary: Negative for dysuria, urgency and frequency.  Musculoskeletal: Positive for back pain.       R leg pain  Skin: Negative for rash.  Neurological: Negative for weakness and headaches.       Radiating pain down R leg.      Objective  Filed Vitals:   05/14/15 1540  BP: 117/77  Pulse: 80  Temp: 98.6 F (37 C)  TempSrc: Oral  Resp: 16  Height: 5\' 4"  (1.626 m)  Weight: 203 lb (92.08 kg)     Physical Exam  Constitutional: She is well-developed, well-nourished, and in no distress. No distress.  HENT:  Head: Normocephalic and atraumatic.  Neck: Normal range of motion. Neck supple. Carotid bruit is not present. No thyromegaly present.  Cardiovascular: Normal  rate, regular rhythm and normal heart sounds.   Pulmonary/Chest: Effort normal and breath sounds normal.  Musculoskeletal:  Discomfort over sacral spine and intop R buttock.  R post. Thigh and post lower l;eg ten der to p(alp now.  No swelling of R leg com(pared to L.  + R straight leg raising test.  Lymphadenopathy:    She has no cervical adenopathy.  Vitals reviewed.     No results found for this or any previous visit (from the past 2160 hour(s)).   Assessment & Plan  1. Sciatica of right side Cont. Etodolac - metaxalone (SKELAXIN) 800 MG tablet; Take 1 tablet (800 mg total) by mouth 3 (three) times daily.  Dispense: 30 tablet; Refill: 2 - gabapentin (NEURONTIN) 400 MG capsule; Take 1 capsule three times a day.  Dispense: 90 capsule; Refill: 6 - Ambulatory referral to Physical Therapy  2. Fibromyalgia  - gabapentin (NEURONTIN) 400 MG capsule; Take 1 capsule three times a day.  Dispense: 90 capsule; Refill: 6

## 2015-05-20 ENCOUNTER — Other Ambulatory Visit: Payer: Self-pay | Admitting: Family Medicine

## 2015-06-15 ENCOUNTER — Ambulatory Visit: Payer: Managed Care, Other (non HMO) | Admitting: Family Medicine

## 2015-07-06 ENCOUNTER — Ambulatory Visit (INDEPENDENT_AMBULATORY_CARE_PROVIDER_SITE_OTHER): Payer: Managed Care, Other (non HMO) | Admitting: Family Medicine

## 2015-07-06 ENCOUNTER — Encounter: Payer: Self-pay | Admitting: Family Medicine

## 2015-07-06 VITALS — BP 119/80 | HR 83 | Temp 98.9°F | Resp 16 | Ht 64.0 in | Wt 212.0 lb

## 2015-07-06 DIAGNOSIS — F32A Depression, unspecified: Secondary | ICD-10-CM

## 2015-07-06 DIAGNOSIS — M797 Fibromyalgia: Secondary | ICD-10-CM

## 2015-07-06 DIAGNOSIS — M5431 Sciatica, right side: Secondary | ICD-10-CM | POA: Diagnosis not present

## 2015-07-06 DIAGNOSIS — F329 Major depressive disorder, single episode, unspecified: Secondary | ICD-10-CM | POA: Diagnosis not present

## 2015-07-06 DIAGNOSIS — I10 Essential (primary) hypertension: Secondary | ICD-10-CM

## 2015-07-06 MED ORDER — GABAPENTIN 600 MG PO TABS: 600.0000 mg | ORAL_TABLET | Freq: Three times a day (TID) | ORAL | Status: DC

## 2015-07-06 NOTE — Progress Notes (Signed)
Name: Rachel Townsend   MRN: [id number]    DOB: [birth year]   Date:07/06/2015       Progress Note  Subjective  Chief Complaint  Chief Complaint  Patient presents with  . Fibromyalgia    HPI Here for f/u of fibromyalgia.  She is doing fair.  Her fibromyalgia seemed to do a little better on higher dose of Gabapentin until weather fronts started coming through.  No problem-specific assessment & plan notes found for this encounter.   Past Medical History  Diagnosis Date  . Allergy   . Wrist pain   . Fibromyalgia   . Hypertension     Past Surgical History  Procedure Laterality Date  . Ectopic pregnancy surgery    . Foot surgery      Family History  Problem Relation Age of Onset  . Cancer Mother     breast cancer  . Hypertension Mother   . Diabetes Mother   . Hyperlipidemia Mother   . Hypertension Father   . Hyperlipidemia Father     Social History   Social History  . Marital Status: Married    Spouse Name: N/A  . Number of Children: N/A  . Years of Education: N/A   Occupational History  . Not on file.   Social History Main Topics  . Smoking status: Former Smoker    Quit date: 11/23/1984  . Smokeless tobacco: Never Used  . Alcohol Use: 6.0 oz/week    10 Glasses of wine per week  . Drug Use: No  . Sexual Activity: Yes    Birth Control/ Protection: Injection   Other Topics Concern  . Not on file   Social History Narrative     Current outpatient prescriptions:  .  Acetaminophen (TYLENOL ARTHRITIS PAIN PO), Take by mouth., Disp: , Rfl:  .  atorvastatin (LIPITOR) 20 MG tablet, Take 1 tablet by mouth  daily, Disp: 90 tablet, Rfl: 3 .  b complex vitamins tablet, Take 1 tablet by mouth daily., Disp: , Rfl:  .  Biotin 1 MG CAPS, Take by mouth., Disp: , Rfl:  .  buPROPion (WELLBUTRIN XL) 300 MG 24 hr tablet, Take 1 tablet by mouth  daily, Disp: 90 tablet, Rfl: 3 .  Calcium-Vitamin D 600-200 MG-UNIT tablet, Take 1 tablet by mouth 2 (two) times daily. , Disp: ,  Rfl:  .  Cholecalciferol (VITAMIN D-1000 MAX ST) 1000 units tablet, Take 1,000 Units by mouth daily., Disp: , Rfl:  .  co-enzyme Q-10 50 MG capsule, Take 100 mg by mouth daily., Disp: , Rfl:  .  diphenhydramine-acetaminophen (TYLENOL PM) 25-500 MG TABS tablet, Take 1 tablet by mouth at bedtime as needed., Disp: , Rfl:  .  etodolac (LODINE XL) 500 MG 24 hr tablet, Take 1 tablet (500 mg total) by mouth daily as needed. (Patient taking differently: Take 500 mg by mouth 2 (two) times daily. ), Disp: 60 tablet, Rfl: 0 .  FOLIC ACID PO, Take 1 tablet by mouth daily. , Disp: , Rfl:  .  gabapentin (NEURONTIN) 600 MG tablet, Take 1 tablet (600 mg total) by mouth 3 (three) times daily., Disp: 90 tablet, Rfl: 6 .  lisinopril (PRINIVIL,ZESTRIL) 5 MG tablet, Take 1 tablet by mouth  daily, Disp: 90 tablet, Rfl: 3 .  loratadine (CLARITIN) 10 MG tablet, Take 10 mg by mouth daily. , Disp: , Rfl:  .  magnesium citrate SOLN, Take 1 Bottle by mouth once., Disp: , Rfl:  .  NIACIN CR PO, Take 1  tablet by mouth daily. , Disp: , Rfl:  .  Omega-3 Fatty Acids (FISH OIL) 1000 MG CAPS, Take 1,200 mg by mouth daily. , Disp: , Rfl:  .  sertraline (ZOLOFT) 100 MG tablet, Take 1 tablet in the AM each day, Disp: 90 tablet, Rfl: 3  Not on File   Review of Systems  Constitutional: Negative for fever, chills, weight loss and malaise/fatigue.  HENT: Negative for hearing loss.   Eyes: Negative for blurred vision and double vision.  Respiratory: Negative for cough, shortness of breath and wheezing.   Cardiovascular: Negative for chest pain, palpitations and leg swelling.  Gastrointestinal: Negative for heartburn, abdominal pain and blood in stool.  Genitourinary: Negative for dysuria, urgency and frequency.  Musculoskeletal: Positive for myalgias. Negative for joint pain.  Skin: Negative for rash.  Neurological: Negative for tremors, weakness and headaches.      Objective  Filed Vitals:   07/06/15 1432  BP: 119/80   Pulse: 83  Temp: 98.9 F (37.2 C)  TempSrc: Oral  Resp: 16  Height: 5\' 4"  (1.626 m)  Weight: 212 lb (96.163 kg)    Physical Exam  Constitutional: She is oriented to person, place, and time and well-developed, well-nourished, and in no distress. No distress.  HENT:  Head: Normocephalic and atraumatic.  Eyes: Conjunctivae and EOM are normal. Pupils are equal, round, and reactive to light. No scleral icterus.  Neck: Normal range of motion. Neck supple. Carotid bruit is not present. Thyromegaly present.  Cardiovascular: Normal rate, regular rhythm and normal heart sounds.  Exam reveals no gallop and no friction rub.   No murmur heard. Pulmonary/Chest: Effort normal and breath sounds normal. No respiratory distress. She has no wheezes. She has no rales.  Musculoskeletal: She exhibits no edema.  Lymphadenopathy:    She has no cervical adenopathy.  Neurological: She is alert and oriented to person, place, and time.       No results found for this or any previous visit (from the past 2160 hour(s)).   Assessment & Plan  Problem List Items Addressed This Visit      Cardiovascular and Mediastinum   Hypertension     Nervous and Auditory   Sciatica   Relevant Medications   gabapentin (NEURONTIN) 600 MG tablet     Musculoskeletal and Integument   Fibromyalgia - Primary   Relevant Medications   gabapentin (NEURONTIN) 600 MG tablet     Other   Depression      Meds ordered this encounter  Medications  . gabapentin (NEURONTIN) 600 MG tablet    Sig: Take 1 tablet (600 mg total) by mouth 3 (three) times daily.    Dispense:  90 tablet    Refill:  6   1. Fibromyalgia  - gabapentin (NEURONTIN) 600 MG tablet; Take 1 tablet (600 mg total) by mouth 3 (three) times daily.  Dispense: 90 tablet; Refill: 6  2. Essential hypertension   3. Depression   4. Sciatica of right side  Continue all other meds at current doses.

## 2015-08-17 ENCOUNTER — Ambulatory Visit (INDEPENDENT_AMBULATORY_CARE_PROVIDER_SITE_OTHER): Payer: Managed Care, Other (non HMO) | Admitting: Family Medicine

## 2015-08-17 ENCOUNTER — Encounter: Payer: Self-pay | Admitting: Family Medicine

## 2015-08-17 VITALS — BP 122/79 | HR 78 | Temp 98.0°F | Resp 16 | Ht 64.0 in | Wt 213.0 lb

## 2015-08-17 DIAGNOSIS — M797 Fibromyalgia: Secondary | ICD-10-CM

## 2015-08-17 DIAGNOSIS — F329 Major depressive disorder, single episode, unspecified: Secondary | ICD-10-CM

## 2015-08-17 DIAGNOSIS — R5382 Chronic fatigue, unspecified: Secondary | ICD-10-CM | POA: Insufficient documentation

## 2015-08-17 DIAGNOSIS — F32A Depression, unspecified: Secondary | ICD-10-CM

## 2015-08-17 DIAGNOSIS — G4700 Insomnia, unspecified: Secondary | ICD-10-CM | POA: Diagnosis not present

## 2015-08-17 DIAGNOSIS — I10 Essential (primary) hypertension: Secondary | ICD-10-CM

## 2015-08-17 MED ORDER — GABAPENTIN 600 MG PO TABS: 600.0000 mg | ORAL_TABLET | Freq: Three times a day (TID) | ORAL | Status: DC

## 2015-08-17 NOTE — Progress Notes (Signed)
Name: Rachel Townsend   MRN: [id number]    DOB: Apr 11, 1960   Date:08/17/2015       Progress Note  Subjective  Chief Complaint  Chief Complaint  Patient presents with  . Weight Check  . Fibromyalgia    HPI Here for f/u of fibromyalgia and weight.  Her fibro is stable.  Still painful, but she functions still.  Weight has not gone down.  No problem-specific assessment & plan notes found for this encounter.   Past Medical History  Diagnosis Date  . Allergy   . Wrist pain   . Fibromyalgia   . Hypertension     Past Surgical History  Procedure Laterality Date  . Ectopic pregnancy surgery    . Foot surgery      Family History  Problem Relation Age of Onset  . Cancer Mother     breast cancer  . Hypertension Mother   . Diabetes Mother   . Hyperlipidemia Mother   . Hypertension Father   . Hyperlipidemia Father     Social History   Social History  . Marital Status: Married    Spouse Name: N/A  . Number of Children: N/A  . Years of Education: N/A   Occupational History  . Not on file.   Social History Main Topics  . Smoking status: Former Smoker    Quit date: 11/23/1984  . Smokeless tobacco: Never Used  . Alcohol Use: 6.0 oz/week    10 Glasses of wine per week  . Drug Use: No  . Sexual Activity: Yes    Birth Control/ Protection: Injection   Other Topics Concern  . Not on file   Social History Narrative     Current outpatient prescriptions:  .  Acetaminophen (TYLENOL ARTHRITIS PAIN PO), Take by mouth., Disp: , Rfl:  .  atorvastatin (LIPITOR) 20 MG tablet, Take 1 tablet by mouth  daily, Disp: 90 tablet, Rfl: 3 .  b complex vitamins tablet, Take 1 tablet by mouth daily., Disp: , Rfl:  .  buPROPion (WELLBUTRIN XL) 300 MG 24 hr tablet, Take 1 tablet by mouth  daily, Disp: 90 tablet, Rfl: 3 .  Calcium-Vitamin D 600-200 MG-UNIT tablet, Take 1 tablet by mouth 2 (two) times daily. , Disp: , Rfl:  .  Cholecalciferol (VITAMIN D-1000 MAX ST) 1000 units tablet, Take  1,000 Units by mouth daily., Disp: , Rfl:  .  co-enzyme Q-10 50 MG capsule, Take 100 mg by mouth daily., Disp: , Rfl:  .  diphenhydramine-acetaminophen (TYLENOL PM) 25-500 MG TABS tablet, Take 1 tablet by mouth at bedtime as needed., Disp: , Rfl:  .  etodolac (LODINE XL) 500 MG 24 hr tablet, Take 1 tablet (500 mg total) by mouth daily as needed. (Patient taking differently: Take 500 mg by mouth 2 (two) times daily. ), Disp: 60 tablet, Rfl: 0 .  FOLIC ACID PO, Take 1 tablet by mouth daily. , Disp: , Rfl:  .  gabapentin (NEURONTIN) 600 MG tablet, Take 1 tablet (600 mg total) by mouth 3 (three) times daily., Disp: 270 tablet, Rfl: 3 .  lisinopril (PRINIVIL,ZESTRIL) 5 MG tablet, Take 1 tablet by mouth  daily, Disp: 90 tablet, Rfl: 3 .  loratadine (CLARITIN) 10 MG tablet, Take 10 mg by mouth daily. , Disp: , Rfl:  .  magnesium citrate SOLN, Take 1 Bottle by mouth once., Disp: , Rfl:  .  NIACIN CR PO, Take 1 tablet by mouth daily. , Disp: , Rfl:  .  Omega-3 Fatty Acids (  FISH OIL) 1000 MG CAPS, Take 1,200 mg by mouth daily. , Disp: , Rfl:  .  sertraline (ZOLOFT) 100 MG tablet, Take 1 tablet in the AM each day, Disp: 90 tablet, Rfl: 3  Not on File   Review of Systems  Constitutional: Positive for malaise/fatigue. Negative for fever, chills and weight loss.  HENT: Negative for hearing loss.   Eyes: Negative for blurred vision and double vision.  Respiratory: Negative for cough, shortness of breath and wheezing.   Cardiovascular: Negative for chest pain, palpitations and leg swelling.  Gastrointestinal: Negative for heartburn, abdominal pain and blood in stool.  Genitourinary: Negative for dysuria, urgency and frequency.  Musculoskeletal: Positive for myalgias.  Skin: Negative for rash.  Neurological: Positive for weakness. Negative for headaches.      Objective  Filed Vitals:   08/17/15 1322  BP: 122/79  Pulse: 78  Temp: 98 F (36.7 C)  TempSrc: Oral  Resp: 16  Height: 5\' 4"  (1.626 m)   Weight: 213 lb (96.616 kg)    Physical Exam  Constitutional: She is oriented to person, place, and time and well-developed, well-nourished, and in no distress. No distress.  HENT:  Head: Normocephalic and atraumatic.  Eyes: Conjunctivae and EOM are normal. Pupils are equal, round, and reactive to light. No scleral icterus.  Neck: Normal range of motion. Neck supple. Carotid bruit is not present. No thyromegaly present.  Cardiovascular: Normal rate, regular rhythm and normal heart sounds.  Exam reveals no gallop and no friction rub.   No murmur heard. Pulmonary/Chest: Effort normal and breath sounds normal. No respiratory distress. She has no wheezes. She has no rales.  Abdominal: Soft. Bowel sounds are normal. She exhibits no distension and no mass. There is no tenderness.  Musculoskeletal: She exhibits no edema.  Lymphadenopathy:    She has no cervical adenopathy.  Neurological: She is alert and oriented to person, place, and time.  Psychiatric:  Affect is depressed  Vitals reviewed.      No results found for this or any previous visit (from the past 2160 hour(s)).   Assessment & Plan  Problem List Items Addressed This Visit      Cardiovascular and Mediastinum   Hypertension   Relevant Orders   Comprehensive Metabolic Panel (CMET)   Lipid Profile     Musculoskeletal and Integument   Fibromyalgia - Primary   Relevant Medications   gabapentin (NEURONTIN) 600 MG tablet     Other   Depression   Insomnia   Chronic fatigue   Relevant Orders   CBC with Differential   TSH   Vitamin D (25 hydroxy)   B12      Meds ordered this encounter  Medications  . gabapentin (NEURONTIN) 600 MG tablet    Sig: Take 1 tablet (600 mg total) by mouth 3 (three) times daily.    Dispense:  270 tablet    Refill:  3   1. Fibromyalgi - gabapentin (NEURONTIN) 600 MG tablet; Take 1 tablet (600 mg total) by mouth 3 (three) times daily.  Dispense: 270 tablet; Refill: 3  2. Essential  hypertension Cont Lisinoproil - Comprehensive Metabolic Panel (CMET) - Lipid Profile  3. Depression cont Zoloft and Wellbutrin  4. Insomnia   5. Chronic fatigue - CBC with Differential - TSH - Vitamin D (25 hydroxy) -=Vitamin B12

## 2015-08-18 ENCOUNTER — Other Ambulatory Visit: Payer: Self-pay | Admitting: Family Medicine

## 2015-08-19 LAB — CBC WITH DIFFERENTIAL/PLATELET
BASOS ABS: 0 10*3/uL (ref 0.0–0.2)
Basos: 0 %
EOS (ABSOLUTE): 0.2 10*3/uL (ref 0.0–0.4)
Eos: 4 %
HEMOGLOBIN: 12.7 g/dL (ref 11.1–15.9)
Hematocrit: 38.4 % (ref 34.0–46.6)
Immature Grans (Abs): 0 10*3/uL (ref 0.0–0.1)
Immature Granulocytes: 0 %
LYMPHS ABS: 1.5 10*3/uL (ref 0.7–3.1)
Lymphs: 26 %
MCH: 29.5 pg (ref 26.6–33.0)
MCHC: 33.1 g/dL (ref 31.5–35.7)
MCV: 89 fL (ref 79–97)
MONOCYTES: 9 %
MONOS ABS: 0.5 10*3/uL (ref 0.1–0.9)
NEUTROS ABS: 3.5 10*3/uL (ref 1.4–7.0)
Neutrophils: 61 %
Platelets: 284 10*3/uL (ref 150–379)
RBC: 4.31 x10E6/uL (ref 3.77–5.28)
RDW: 13.9 % (ref 12.3–15.4)
WBC: 5.7 10*3/uL (ref 3.4–10.8)

## 2015-08-19 LAB — COMPREHENSIVE METABOLIC PANEL
ALBUMIN: 3.9 g/dL (ref 3.5–5.5)
ALK PHOS: 75 IU/L (ref 39–117)
ALT: 16 IU/L (ref 0–32)
AST: 17 IU/L (ref 0–40)
Albumin/Globulin Ratio: 1.8 (ref 1.2–2.2)
BILIRUBIN TOTAL: 0.4 mg/dL (ref 0.0–1.2)
BUN / CREAT RATIO: 19 (ref 9–23)
BUN: 19 mg/dL (ref 6–24)
CHLORIDE: 101 mmol/L (ref 96–106)
CO2: 23 mmol/L (ref 18–29)
Calcium: 9.1 mg/dL (ref 8.7–10.2)
Creatinine, Ser: 1 mg/dL (ref 0.57–1.00)
GFR calc Af Amer: 73 mL/min/{1.73_m2} (ref >59)
GFR calc non Af Amer: 64 mL/min/{1.73_m2} (ref >59)
GLUCOSE: 99 mg/dL (ref 65–99)
Globulin, Total: 2.2 g/dL (ref 1.5–4.5)
Potassium: 4.2 mmol/L (ref 3.5–5.2)
SODIUM: 140 mmol/L (ref 134–144)
Total Protein: 6.1 g/dL (ref 6.0–8.5)

## 2015-08-19 LAB — LIPID PANEL
CHOL/HDL RATIO: 3.6 ratio (ref 0.0–4.4)
CHOLESTEROL TOTAL: 192 mg/dL (ref 100–199)
HDL: 53 mg/dL (ref >39)
LDL CALC: 110 mg/dL — AB (ref 0–99)
TRIGLYCERIDES: 146 mg/dL (ref 0–149)
VLDL CHOLESTEROL CAL: 29 mg/dL (ref 5–40)

## 2015-08-19 LAB — VITAMIN B12
VITAMIN B 12: 504 pg/mL (ref 211–946)
Vitamin B-12: 490 pg/mL (ref 211–946)

## 2015-08-19 LAB — TSH: TSH: 3.87 u[IU]/mL (ref 0.450–4.500)

## 2015-08-19 LAB — VITAMIN D 25 HYDROXY (VIT D DEFICIENCY, FRACTURES): Vit D, 25-Hydroxy: 59.1 ng/mL (ref 30.0–100.0)

## 2015-09-22 ENCOUNTER — Other Ambulatory Visit: Payer: Self-pay | Admitting: Family Medicine

## 2015-10-12 ENCOUNTER — Other Ambulatory Visit: Payer: Self-pay | Admitting: Family Medicine

## 2015-10-12 ENCOUNTER — Ambulatory Visit
Admission: RE | Admit: 2015-10-12 | Discharge: 2015-10-12 | Disposition: A | Discharge status: Home / Self Care | Payer: Managed Care, Other (non HMO) | Source: Ambulatory Visit | Attending: Family Medicine | Admitting: Family Medicine

## 2015-10-12 ENCOUNTER — Ambulatory Visit (INDEPENDENT_AMBULATORY_CARE_PROVIDER_SITE_OTHER): Payer: Managed Care, Other (non HMO) | Admitting: Family Medicine

## 2015-10-12 VITALS — BP 124/76 | HR 64 | Temp 98.3°F | Ht 64.0 in | Wt 216.0 lb

## 2015-10-12 DIAGNOSIS — Z23 Encounter for immunization: Secondary | ICD-10-CM | POA: Diagnosis not present

## 2015-10-12 DIAGNOSIS — Z124 Encounter for screening for malignant neoplasm of cervix: Secondary | ICD-10-CM | POA: Diagnosis not present

## 2015-10-12 DIAGNOSIS — Z01419 Encounter for gynecological examination (general) (routine) without abnormal findings: Secondary | ICD-10-CM

## 2015-10-12 DIAGNOSIS — E669 Obesity, unspecified: Secondary | ICD-10-CM | POA: Diagnosis not present

## 2015-10-12 DIAGNOSIS — Z1231 Encounter for screening mammogram for malignant neoplasm of breast: Secondary | ICD-10-CM | POA: Insufficient documentation

## 2015-10-12 DIAGNOSIS — Z1239 Encounter for other screening for malignant neoplasm of breast: Secondary | ICD-10-CM | POA: Diagnosis not present

## 2015-10-12 NOTE — Progress Notes (Signed)
Subjective:    Patient ID: Rachel Townsend, female    DOB: May 30, 1960, 55 y.o.   MRN: [id number]  HPI: Rachel Townsend is a 56 y.o. female presenting on 10/12/2015 for Gynecologic Exam (need papsmear last papa was last year was abnormal per pt)   HPI  Pt presents for annual exam. Doing well. Had ASCUS pap last year- will repeat this year. HPV was negative. No vaginal discharge or pain with sex. Post-menopausal. Mother passed from breast cancer last year. She would like mammogram this year.  She needs appeals forms completed for [facility] for her BMI. She is obsese. No regular exercise. Works at [employer] is on her feet all the time. No formal exercise at home. Tries to eat well.   Past Medical History:  Diagnosis Date  . Allergy   . Fibromyalgia   . Hypertension   . Wrist pain    Social History   Social History  . Marital status: Married    Spouse name: N/A  . Number of children: N/A  . Years of education: N/A   Occupational History  . Not on file.   Social History Main Topics  . Smoking status: Former Smoker    Quit date: 11/23/1984  . Smokeless tobacco: Never Used  . Alcohol use 6.0 oz/week    10 Glasses of wine per week  . Drug use: No  . Sexual activity: Yes    Birth control/ protection: Injection   Other Topics Concern  . Not on file   Social History Narrative  . No narrative on file   Family History  Problem Relation Age of Onset  . Cancer Mother     breast cancer  . Hypertension Mother   . Diabetes Mother   . Hyperlipidemia Mother   . Hypertension Father   . Hyperlipidemia Father    Current Outpatient Prescriptions on File Prior to Visit  Medication Sig  . Acetaminophen (TYLENOL ARTHRITIS PAIN PO) Take by mouth.  Marland Kitchen atorvastatin (LIPITOR) 20 MG tablet Take 1 tablet by mouth  daily  . b complex vitamins tablet Take 1 tablet by mouth daily.  Marland Kitchen buPROPion (WELLBUTRIN XL) 300 MG 24 hr tablet Take 1 tablet by mouth  daily  . Calcium-Vitamin D 600-200  MG-UNIT tablet Take 1 tablet by mouth 2 (two) times daily.   . Cholecalciferol (VITAMIN D-1000 MAX ST) 1000 units tablet Take 1,000 Units by mouth daily.  Marland Kitchen co-enzyme Q-10 50 MG capsule Take 100 mg by mouth daily.  . diphenhydramine-acetaminophen (TYLENOL PM) 25-500 MG TABS tablet Take 1 tablet by mouth at bedtime as needed.  . etodolac (LODINE XL) 500 MG 24 hr tablet Take 1 tablet by mouth two  times daily  . FOLIC ACID PO Take 1 tablet by mouth daily.   Marland Kitchen gabapentin (NEURONTIN) 600 MG tablet Take 1 tablet (600 mg total) by mouth 3 (three) times daily.  Marland Kitchen lisinopril (PRINIVIL,ZESTRIL) 5 MG tablet Take 1 tablet by mouth  daily  . loratadine (CLARITIN) 10 MG tablet Take 10 mg by mouth daily.   . magnesium citrate SOLN Take 1 Bottle by mouth once.  Marland Kitchen NIACIN CR PO Take 1 tablet by mouth daily.   . Omega-3 Fatty Acids (FISH OIL) 1000 MG CAPS Take 1,200 mg by mouth daily.   . sertraline (ZOLOFT) 100 MG tablet Take 1 tablet in the AM each day   No current facility-administered medications on file prior to visit.     Review of Systems  Constitutional: Negative  for chills and fever.  HENT: Negative.   Respiratory: Negative for cough, chest tightness and wheezing.   Cardiovascular: Negative for chest pain and leg swelling.  Gastrointestinal: Negative for abdominal pain, constipation, diarrhea, nausea and vomiting.  Endocrine: Negative.  Negative for cold intolerance, heat intolerance, polydipsia, polyphagia and polyuria.  Genitourinary: Negative for difficulty urinating, dysuria, flank pain, genital sores, pelvic pain, vaginal bleeding, vaginal discharge and vaginal pain.  Musculoskeletal: Negative.   Neurological: Negative for dizziness, light-headedness and numbness.  Psychiatric/Behavioral: Negative.    Per HPI unless specifically indicated above     Objective:    BP 124/76 (BP Location: Left Arm)   Pulse 64   Temp 98.3 F (36.8 C) (Oral)   Ht 5\' 4"  (1.626 m)   Wt 216 lb (98 kg)    LMP  (Approximate)   BMI 37.08 kg/m   Wt Readings from Last 3 Encounters:  10/12/15 216 lb (98 kg)  08/17/15 213 lb (96.6 kg)  07/06/15 212 lb (96.2 kg)    Physical Exam  Constitutional: She is oriented to person, place, and time. She appears well-developed and well-nourished.  HENT:  Head: Normocephalic and atraumatic.  Neck: Neck supple.  Cardiovascular: Normal rate, regular rhythm and normal heart sounds.  Exam reveals no gallop and no friction rub.   No murmur heard. Pulmonary/Chest: Effort normal and breath sounds normal. She has no wheezes. She exhibits no tenderness. Right breast exhibits no inverted nipple, no mass, no nipple discharge, no skin change and no tenderness. Left breast exhibits no inverted nipple, no mass, no nipple discharge, no skin change and no tenderness. Breasts are symmetrical.  Abdominal: Soft. Normal appearance and bowel sounds are normal. She exhibits no distension and no mass. There is no tenderness. There is no rebound and no guarding.  Genitourinary: Vagina normal and uterus normal. There is no rash, tenderness, lesion or injury on the right labia. There is no rash, tenderness, lesion or injury on the left labia. Uterus is not tender. Cervix exhibits no motion tenderness, no discharge and no friability. Right adnexum displays no tenderness and no fullness. Left adnexum displays no tenderness and no fullness. No erythema, tenderness or bleeding in the vagina.  Musculoskeletal: Normal range of motion. She exhibits no edema or tenderness.  Lymphadenopathy:    She has no cervical adenopathy.  Neurological: She is alert and oriented to person, place, and time.  Skin: Skin is warm and dry.   Results for orders placed or performed in visit on 08/18/15  CBC with Differential/Platelet  Result Value Ref Range   WBC 5.7 3.4 - 10.8 x10E3/uL   RBC 4.31 3.77 - 5.28 x10E6/uL   Hemoglobin 12.7 11.1 - 15.9 g/dL   Hematocrit 38.4 34.0 - 46.6 %   MCV 89 79 - 97 fL   MCH  29.5 26.6 - 33.0 pg   MCHC 33.1 31.5 - 35.7 g/dL   RDW 13.9 12.3 - 15.4 %   Platelets 284 150 - 379 x10E3/uL   Neutrophils 61 %   Lymphs 26 %   Monocytes 9 %   Eos 4 %   Basos 0 %   Neutrophils Absolute 3.5 1.4 - 7.0 x10E3/uL   Lymphocytes Absolute 1.5 0.7 - 3.1 x10E3/uL   Monocytes Absolute 0.5 0.1 - 0.9 x10E3/uL   EOS (ABSOLUTE) 0.2 0.0 - 0.4 x10E3/uL   Basophils Absolute 0.0 0.0 - 0.2 x10E3/uL   Immature Granulocytes 0 %   Immature Grans (Abs) 0.0 0.0 - 0.1 x10E3/uL  Comprehensive  metabolic panel  Result Value Ref Range   Glucose 99 65 - 99 mg/dL   BUN 19 6 - 24 mg/dL   Creatinine, Ser 1.00 0.57 - 1.00 mg/dL   GFR calc non Af Amer 64 >59 mL/min/1.73   GFR calc Af Amer 73 >59 mL/min/1.73   BUN/Creatinine Ratio 19 9 - 23   Sodium 140 134 - 144 mmol/L   Potassium 4.2 3.5 - 5.2 mmol/L   Chloride 101 96 - 106 mmol/L   CO2 23 18 - 29 mmol/L   Calcium 9.1 8.7 - 10.2 mg/dL   Total Protein 6.1 6.0 - 8.5 g/dL   Albumin 3.9 3.5 - 5.5 g/dL   Globulin, Total 2.2 1.5 - 4.5 g/dL   Albumin/Globulin Ratio 1.8 1.2 - 2.2   Bilirubin Total 0.4 0.0 - 1.2 mg/dL   Alkaline Phosphatase 75 39 - 117 IU/L   AST 17 0 - 40 IU/L   ALT 16 0 - 32 IU/L  Lipid panel  Result Value Ref Range   Cholesterol, Total 192 100 - 199 mg/dL   Triglycerides 146 0 - 149 mg/dL   HDL 53 >39 mg/dL   VLDL Cholesterol Cal 29 5 - 40 mg/dL   LDL Calculated 110 (H) 0 - 99 mg/dL   Chol/HDL Ratio 3.6 0.0 - 4.4 ratio units  TSH  Result Value Ref Range   TSH 3.870 0.450 - 4.500 uIU/mL  VITAMIN D 25 Hydroxy (Vit-D Deficiency, Fractures)  Result Value Ref Range   Vit D, 25-Hydroxy 59.1 30.0 - 100.0 ng/mL      Assessment & Plan:   Problem List Items Addressed This Visit      Other   Need for influenza vaccination   Relevant Orders   Flu Vaccine QUAD 36+ mos PF IM (Fluarix & Fluzone Quad PF) (Completed)   Obesity    Reviewed tips for a healthy lifestyle. Encouraged exercise outside of work to help with both mental  and physical health. Referred pt to choose [email or web address]        Other Visit Diagnoses    Well woman exam with routine gynecological exam    -  Primary   Health maintenance reviewed. Mammogram ordered.    Relevant Orders   Pap liquid-based and HPV (high risk)   Screening for cervical cancer       Repeat pap due to ascus last year.    Relevant Orders   Pap liquid-based and HPV (high risk)   Screening for breast cancer       Relevant Orders   MM Digital Screening      No orders of the defined types were placed in this encounter.     Follow up plan: Return in about 1 year (around 10/11/2016), or if symptoms worsen or fail to improve.

## 2015-10-12 NOTE — Patient Instructions (Addendum)
Your BMI is considered obese.  Losing weight will help you maintain health throughout your lifespan and prevent the development of chronic health conditions.  Please try to meet the goal of 150 minutes of exercise per week.  This is generally 30-40 minutes of moderate activity 3-4 times per week. Consider a calorie goal of 1800 per day. Eat mainly fruits, vegetables, and lean proteins (chicken or fish).  A great resource for healthy living and weight loss is [employer].ee  Health Maintenance, Female Adopting a healthy lifestyle and getting preventive care can go a long way to promote health and wellness. Talk with your health care provider about what schedule of regular examinations is right for you. This is a good chance for you to check in with your provider about disease prevention and staying healthy. In between checkups, there are plenty of things you can do on your own. Experts have done a lot of research about which lifestyle changes and preventive measures are most likely to keep you healthy. Ask your health care provider for more information. WEIGHT AND DIET  Eat a healthy diet  Be sure to include plenty of vegetables, fruits, low-fat dairy products, and lean protein.  Do not eat a lot of foods high in solid fats, added sugars, or salt.  Get regular exercise. This is one of the most important things you can do for your health.  Most adults should exercise for at least 150 minutes each week. The exercise should increase your heart rate and make you sweat (moderate-intensity exercise).  Most adults should also do strengthening exercises at least twice a week. This is in addition to the moderate-intensity exercise.  Maintain a healthy weight  Body mass index (BMI) is a measurement that can be used to identify possible weight problems. It estimates body fat based on height and weight. Your health care provider can help determine your BMI and help you achieve or maintain a healthy  weight.  For females 15 years of age and older:   A BMI below 18.5 is considered underweight.  A BMI of 18.5 to 24.9 is normal.  A BMI of 25 to 29.9 is considered overweight.  A BMI of 30 and above is considered obese.  Watch levels of cholesterol and blood lipids  You should start having your blood tested for lipids and cholesterol at 56 years of age, then have this test every 5 years.  You may need to have your cholesterol levels checked more often if:  Your lipid or cholesterol levels are high.  You are older than 56 years of age.  You are at high risk for heart disease.  CANCER SCREENING   Lung Cancer  Lung cancer screening is recommended for adults 17-50 years old who are at high risk for lung cancer because of a history of smoking.  A yearly low-dose CT scan of the lungs is recommended for people who:  Currently smoke.  Have quit within the past 15 years.  Have at least a 30-pack-year history of smoking. A pack year is smoking an average of one pack of cigarettes a day for 1 year.  Yearly screening should continue until it has been 15 years since you quit.  Yearly screening should stop if you develop a health problem that would prevent you from having lung cancer treatment.  Breast Cancer  Practice breast self-awareness. This means understanding how your breasts normally appear and feel.  It also means doing regular breast self-exams. Let your health care provider know  about any changes, no matter how small.  If you are in your 20s or 30s, you should have a clinical breast exam (CBE) by a health care provider every 1-3 years as part of a regular health exam.  If you are 16 or older, have a CBE every year. Also consider having a breast X-ray (mammogram) every year.  If you have a family history of breast cancer, talk to your health care provider about genetic screening.  If you are at high risk for breast cancer, talk to your health care provider about  having an MRI and a mammogram every year.  Breast cancer gene (BRCA) assessment is recommended for women who have family members with BRCA-related cancers. BRCA-related cancers include:  Breast.  Ovarian.  Tubal.  Peritoneal cancers.  Results of the assessment will determine the need for genetic counseling and BRCA1 and BRCA2 testing. Cervical Cancer Your health care provider may recommend that you be screened regularly for cancer of the pelvic organs (ovaries, uterus, and vagina). This screening involves a pelvic examination, including checking for microscopic changes to the surface of your cervix (Pap test). You may be encouraged to have this screening done every 3 years, beginning at age 42.  For women ages 67-65, health care providers may recommend pelvic exams and Pap testing every 3 years, or they may recommend the Pap and pelvic exam, combined with testing for human papilloma virus (HPV), every 5 years. Some types of HPV increase your risk of cervical cancer. Testing for HPV may also be done on women of any age with unclear Pap test results.  Other health care providers may not recommend any screening for nonpregnant women who are considered low risk for pelvic cancer and who do not have symptoms. Ask your health care provider if a screening pelvic exam is right for you.  If you have had past treatment for cervical cancer or a condition that could lead to cancer, you need Pap tests and screening for cancer for at least 20 years after your treatment. If Pap tests have been discontinued, your risk factors (such as having a new sexual partner) need to be reassessed to determine if screening should resume. Some women have medical problems that increase the chance of getting cervical cancer. In these cases, your health care provider may recommend more frequent screening and Pap tests. Colorectal Cancer  This type of cancer can be detected and often prevented.  Routine colorectal cancer  screening usually begins at 56 years of age and continues through 56 years of age.  Your health care provider may recommend screening at an earlier age if you have risk factors for colon cancer.  Your health care provider may also recommend using home test kits to check for hidden blood in the stool.  A small camera at the end of a tube can be used to examine your colon directly (sigmoidoscopy or colonoscopy). This is done to check for the earliest forms of colorectal cancer.  Routine screening usually begins at age 40.  Direct examination of the colon should be repeated every 5-10 years through 56 years of age. However, you may need to be screened more often if early forms of precancerous polyps or small growths are found. Skin Cancer  Check your skin from head to toe regularly.  Tell your health care provider about any new moles or changes in moles, especially if there is a change in a mole's shape or color.  Also tell your health care provider if you  have a mole that is larger than the size of a pencil eraser.  Always use sunscreen. Apply sunscreen liberally and repeatedly throughout the day.  Protect yourself by wearing long sleeves, pants, a wide-brimmed hat, and sunglasses whenever you are outside. HEART DISEASE, DIABETES, AND HIGH BLOOD PRESSURE   High blood pressure causes heart disease and increases the risk of stroke. High blood pressure is more likely to develop in:  People who have blood pressure in the high end of the normal range (130-139/85-89 mm Hg).  People who are overweight or obese.  People who are African American.  If you are 34-60 years of age, have your blood pressure checked every 3-5 years. If you are 63 years of age or older, have your blood pressure checked every year. You should have your blood pressure measured twice--once when you are at a hospital or clinic, and once when you are not at a hospital or clinic. Record the average of the two measurements.  To check your blood pressure when you are not at a hospital or clinic, you can use:  An automated blood pressure machine at a pharmacy.  A home blood pressure monitor.  If you are between 36 years and 69 years old, ask your health care provider if you should take aspirin to prevent strokes.  Have regular diabetes screenings. This involves taking a blood sample to check your fasting blood sugar level.  If you are at a normal weight and have a low risk for diabetes, have this test once every three years after 56 years of age.  If you are overweight and have a high risk for diabetes, consider being tested at a younger age or more often. PREVENTING INFECTION  Hepatitis B  If you have a higher risk for hepatitis B, you should be screened for this virus. You are considered at high risk for hepatitis B if:  You were born in a country where hepatitis B is common. Ask your health care provider which countries are considered high risk.  Your parents were born in a high-risk country, and you have not been immunized against hepatitis B (hepatitis B vaccine).  You have HIV or AIDS.  You use needles to inject street drugs.  You live with someone who has hepatitis B.  You have had sex with someone who has hepatitis B.  You get hemodialysis treatment.  You take certain medicines for conditions, including cancer, organ transplantation, and autoimmune conditions. Hepatitis C  Blood testing is recommended for:  Everyone born from [birth year] through 1966.  Anyone with known risk factors for hepatitis C. Sexually transmitted infections (STIs)  You should be screened for sexually transmitted infections (STIs) including gonorrhea and chlamydia if:  You are sexually active and are younger than 56 years of age.  You are older than 56 years of age and your health care provider tells you that you are at risk for this type of infection.  Your sexual activity has changed since you were last screened and  you are at an increased risk for chlamydia or gonorrhea. Ask your health care provider if you are at risk.  If you do not have HIV, but are at risk, it may be recommended that you take a prescription medicine daily to prevent HIV infection. This is called pre-exposure prophylaxis (PrEP). You are considered at risk if:  You are sexually active and do not regularly use condoms or know the HIV status of your partner(s).  You take drugs by injection.  You are sexually active with a partner who has HIV. Talk with your health care provider about whether you are at high risk of being infected with HIV. If you choose to begin PrEP, you should first be tested for HIV. You should then be tested every 3 months for as long as you are taking PrEP.  PREGNANCY   If you are premenopausal and you may become pregnant, ask your health care provider about preconception counseling.  If you may become pregnant, take 400 to 800 micrograms (mcg) of folic acid every day.  If you want to prevent pregnancy, talk to your health care provider about birth control (contraception). OSTEOPOROSIS AND MENOPAUSE   Osteoporosis is a disease in which the bones lose minerals and strength with aging. This can result in serious bone fractures. Your risk for osteoporosis can be identified using a bone density scan.  If you are 74 years of age or older, or if you are at risk for osteoporosis and fractures, ask your health care provider if you should be screened.  Ask your health care provider whether you should take a calcium or vitamin D supplement to lower your risk for osteoporosis.  Menopause may have certain physical symptoms and risks.  Hormone replacement therapy may reduce some of these symptoms and risks. Talk to your health care provider about whether hormone replacement therapy is right for you.  HOME CARE INSTRUCTIONS   Schedule regular health, dental, and eye exams.  Stay current with your immunizations.   Do  not use any tobacco products including cigarettes, chewing tobacco, or electronic cigarettes.  If you are pregnant, do not drink alcohol.  If you are breastfeeding, limit how much and how often you drink alcohol.  Limit alcohol intake to no more than 1 drink per day for nonpregnant women. One drink equals 12 ounces of beer, 5 ounces of wine, or 1 ounces of hard liquor.  Do not use street drugs.  Do not share needles.  Ask your health care provider for help if you need support or information about quitting drugs.  Tell your health care provider if you often feel depressed.  Tell your health care provider if you have ever been abused or do not feel safe at home.   This information is not intended to replace advice given to you by your health care provider. Make sure you discuss any questions you have with your health care provider.   Document Released: 08/02/2010 Document Revised: 02/07/2014 Document Reviewed: 12/19/2012 Elsevier Interactive Patient Education [employer].

## 2015-10-12 NOTE — Assessment & Plan Note (Signed)
Reviewed tips for a healthy lifestyle. Encouraged exercise outside of work to help with both mental and physical health. Referred pt to choose [email or web address]

## 2015-10-13 ENCOUNTER — Telehealth: Payer: Self-pay | Admitting: *Deleted

## 2015-10-13 NOTE — Telephone Encounter (Signed)
Left message for patient to return call. Need to know where [facility] e-screening form needs to go.

## 2015-10-20 LAB — PAP LB AND HPV HIGH-RISK
HPV, high-risk: NEGATIVE
PAP SMEAR COMMENT: 0

## 2015-11-06 ENCOUNTER — Other Ambulatory Visit: Payer: Self-pay | Admitting: Family Medicine

## 2015-11-09 MED ORDER — ETODOLAC ER 500 MG PO TB24: 500.0000 mg | ORAL_TABLET | Freq: Two times a day (BID) | ORAL | 0 refills | Status: DC

## 2015-11-11 ENCOUNTER — Encounter: Payer: Self-pay | Admitting: Family Medicine

## 2015-11-23 ENCOUNTER — Encounter: Payer: Self-pay | Admitting: Family Medicine

## 2015-11-23 ENCOUNTER — Ambulatory Visit (INDEPENDENT_AMBULATORY_CARE_PROVIDER_SITE_OTHER): Payer: Managed Care, Other (non HMO) | Admitting: Family Medicine

## 2015-11-23 VITALS — BP 117/73 | HR 69 | Temp 97.9°F | Resp 16 | Ht 64.0 in

## 2015-11-23 DIAGNOSIS — M797 Fibromyalgia: Secondary | ICD-10-CM

## 2015-11-23 DIAGNOSIS — G4700 Insomnia, unspecified: Secondary | ICD-10-CM | POA: Diagnosis not present

## 2015-11-23 DIAGNOSIS — R5382 Chronic fatigue, unspecified: Secondary | ICD-10-CM

## 2015-11-23 DIAGNOSIS — F330 Major depressive disorder, recurrent, mild: Secondary | ICD-10-CM | POA: Diagnosis not present

## 2015-11-23 MED ORDER — SERTRALINE HCL 100 MG PO TABS: ORAL_TABLET | ORAL | 3 refills | Status: DC

## 2015-11-23 NOTE — Progress Notes (Signed)
Name: Rachel Townsend   MRN: [id number]    DOB: April 05, 1960   Date:11/23/2015       Progress Note  Subjective  Chief Complaint  Chief Complaint  Patient presents with  . Fibromyalgia  . Depression    HPI Here for f/u of fibromyalgia and depression.  Has HBP and elevated chol that is well controlled.  She feels tolerable with the fibromyalgia.  She is tired a lot.  Lots of extra stress at home and work.   She is having problems staying asleep at nights. No problem-specific Assessment & Plan notes found for this encounter.   Past Medical History:  Diagnosis Date  . Allergy   . Fibromyalgia   . Hypertension   . Wrist pain     Past Surgical History:  Procedure Laterality Date  . BREAST BIOPSY Left    core- neg  . BREAST BIOPSY Left    stereo- neg  . ECTOPIC PREGNANCY SURGERY    . FOOT SURGERY      Family History  Problem Relation Age of Onset  . Hypertension Mother   . Diabetes Mother   . Hyperlipidemia Mother   . Cancer Mother   . Breast cancer Mother 66  . Hypertension Father   . Hyperlipidemia Father     Social History   Social History  . Marital status: Married    Spouse name: N/A  . Number of children: N/A  . Years of education: N/A   Occupational History  . Not on file.   Social History Main Topics  . Smoking status: Former Smoker    Quit date: 11/23/1984  . Smokeless tobacco: Never Used  . Alcohol use 6.0 oz/week    10 Glasses of wine per week  . Drug use: No  . Sexual activity: Yes    Birth control/ protection: Injection   Other Topics Concern  . Not on file   Social History Narrative  . No narrative on file     Current Outpatient Prescriptions:  .  Acetaminophen (TYLENOL ARTHRITIS PAIN PO), Take by mouth., Disp: , Rfl:  .  atorvastatin (LIPITOR) 20 MG tablet, Take 1 tablet by mouth  daily, Disp: 90 tablet, Rfl: 3 .  b complex vitamins tablet, Take 1 tablet by mouth daily., Disp: , Rfl:  .  buPROPion (WELLBUTRIN XL) 300 MG 24 hr  tablet, Take 1 tablet by mouth  daily, Disp: 90 tablet, Rfl: 3 .  Calcium-Vitamin D 600-200 MG-UNIT tablet, Take 1 tablet by mouth 2 (two) times daily. , Disp: , Rfl:  .  Cholecalciferol (VITAMIN D-1000 MAX ST) 1000 units tablet, Take 1,000 Units by mouth daily., Disp: , Rfl:  .  co-enzyme Q-10 50 MG capsule, Take 100 mg by mouth daily., Disp: , Rfl:  .  diphenhydramine-acetaminophen (TYLENOL PM) 25-500 MG TABS tablet, Take 1 tablet by mouth at bedtime as needed., Disp: , Rfl:  .  etodolac (LODINE XL) 500 MG 24 hr tablet, Take 1 tablet (500 mg total) by mouth 2 (two) times daily., Disp: 180 tablet, Rfl: 0 .  FOLIC ACID PO, Take 1 tablet by mouth daily. , Disp: , Rfl:  .  gabapentin (NEURONTIN) 600 MG tablet, Take 1 tablet (600 mg total) by mouth 3 (three) times daily., Disp: 270 tablet, Rfl: 3 .  lisinopril (PRINIVIL,ZESTRIL) 5 MG tablet, Take 1 tablet by mouth  daily, Disp: 90 tablet, Rfl: 3 .  loratadine (CLARITIN) 10 MG tablet, Take 10 mg by mouth daily. , Disp: , Rfl:  .  magnesium citrate SOLN, Take 1 Bottle by mouth once., Disp: , Rfl:  .  NIACIN CR PO, Take 1 tablet by mouth daily. , Disp: , Rfl:  .  Omega-3 Fatty Acids (FISH OIL) 1000 MG CAPS, Take 1,200 mg by mouth daily. , Disp: , Rfl:  .  sertraline (ZOLOFT) 100 MG tablet, Take 1.5  tablet in the AM each day, Disp: 135 tablet, Rfl: 3  Not on File   Review of Systems  Constitutional: Negative for chills, fever, malaise/fatigue and weight loss.  HENT: Negative for hearing loss.   Eyes: Negative for blurred vision and double vision.  Respiratory: Negative for cough, shortness of breath and wheezing.   Cardiovascular: Negative for chest pain, palpitations and leg swelling.  Gastrointestinal: Negative for abdominal pain, blood in stool and heartburn.  Genitourinary: Negative for dysuria, frequency and urgency.  Musculoskeletal: Positive for myalgias. Negative for joint pain.  Skin: Negative for rash.  Neurological: Negative for  dizziness, tingling, tremors, weakness and headaches.  Psychiatric/Behavioral: Positive for depression. The patient is not nervous/anxious and does not have insomnia.       Objective  Vitals:   11/23/15 1321  BP: 117/73  Pulse: 69  Resp: 16  Temp: 97.9 F (36.6 C)  TempSrc: Oral  Height: 5\' 4"  (1.626 m)    Physical Exam  Constitutional: She is oriented to person, place, and time and well-developed, well-nourished, and in no distress. No distress.  HENT:  Head: Normocephalic and atraumatic.  Eyes: Conjunctivae and EOM are normal. Pupils are equal, round, and reactive to light. No scleral icterus.  Neck: Normal range of motion. Neck supple. Carotid bruit is not present. No thyromegaly present.  Cardiovascular: Normal rate, regular rhythm and normal heart sounds.  Exam reveals no gallop and no friction rub.   No murmur heard. Pulmonary/Chest: Effort normal and breath sounds normal. No respiratory distress. She has no wheezes. She has no rales.  Abdominal: Bowel sounds are normal. She exhibits no distension and no mass. There is no tenderness.  Musculoskeletal: She exhibits no edema.  + trigger points throughout; milder than in the past.  Lymphadenopathy:    She has no cervical adenopathy.  Neurological: She is alert and oriented to person, place, and time.  Psychiatric:  Affect mildly depressed.  Vitals reviewed.      Recent Results (from the past 2160 hour(s))  Pap liquid-based and HPV (high risk)     Status: None   Collection Time: 10/12/15  9:30 AM  Result Value Ref Range   DIAGNOSIS: Comment     Comment: NEGATIVE FOR INTRAEPITHELIAL LESION AND MALIGNANCY.   Specimen adequacy: Comment     Comment: Satisfactory for evaluation. No endocervical component is identified.   CLINICIAN PROVIDED ICD10: Comment     Comment: Z12.4 Z01.419    Performed by: Comment     Comment: Darrin Nipper, Cytotechnologist (ASCP)   PAP SMEAR COMMENT .    Note: Comment      Comment: The Pap smear is a screening test designed to aid in the detection of premalignant and malignant conditions of the uterine cervix.  It is not a diagnostic procedure and should not be used as the sole means of detecting cervical cancer.  Both false-positive and false-negative reports do occur.    HPV, high-risk Negative Negative    Comment: This high-risk HPV test detects thirteen high-risk types (16/18/31/33/35/39/45/51/52/56/58/59/68) without differentiation.      Assessment & Plan  Problem List Items Addressed This Visit  Other   Fibromyalgia - Primary   Depression   Relevant Medications   sertraline (ZOLOFT) 100 MG tablet   Insomnia   Relevant Medications   sertraline (ZOLOFT) 100 MG tablet   Chronic fatigue    Other Visit Diagnoses   None.     Meds ordered this encounter  Medications  . sertraline (ZOLOFT) 100 MG tablet    Sig: Take 1.5  tablet in the AM each day    Dispense:  135 tablet    Refill:  3   1. Fibromyalgia Cont Gabapentin  2. Mild episode of recurrent major depressive disorder (HCC)  - sertraline (ZOLOFT) 100 MG tablet; Take 1.5  tablet in the AM each day  Dispense: 135 tablet; Refill: 3 Cont Wellbutrin 3. Insomnia, unspecified type  - sertraline (ZOLOFT) 100 MG tablet; Take 1.5  tablet in the AM each day  Dispense: 135 tablet; Refill: 3  4. Chronic fatigue

## 2016-02-01 ENCOUNTER — Encounter: Payer: Self-pay | Admitting: *Deleted

## 2016-02-01 ENCOUNTER — Ambulatory Visit
Admission: EM | Admit: 2016-02-01 | Discharge: 2016-02-01 | Disposition: A | Discharge status: Home / Self Care | Payer: Managed Care, Other (non HMO) | Attending: Emergency Medicine | Admitting: Emergency Medicine

## 2016-02-01 DIAGNOSIS — J101 Influenza due to other identified influenza virus with other respiratory manifestations: Secondary | ICD-10-CM

## 2016-02-01 LAB — RAPID INFLUENZA A&B ANTIGENS (ARMC ONLY): INFLUENZA A (ARMC): POSITIVE — AB

## 2016-02-01 LAB — RAPID INFLUENZA A&B ANTIGENS: Influenza B (ARMC): NEGATIVE

## 2016-02-01 MED ORDER — MOMETASONE FUROATE 50 MCG/ACT NA SUSP: 2.0000 | Freq: Every day | NASAL | 0 refills | Status: DC

## 2016-02-01 MED ORDER — HYDROCOD POLST-CPM POLST ER 10-8 MG/5ML PO SUER: 5.0000 mL | Freq: Two times a day (BID) | ORAL | 0 refills | Status: DC

## 2016-02-01 MED ORDER — OSELTAMIVIR PHOSPHATE 75 MG PO CAPS: 75.0000 mg | ORAL_CAPSULE | Freq: Two times a day (BID) | ORAL | 0 refills | Status: DC

## 2016-02-01 NOTE — ED Provider Notes (Signed)
HPI  SUBJECTIVE:  Rachel Townsend is a 56 y.o. female who presents with a onset of diffuse headache, body aches, nasal congestion, rhinorrhea, postnasal drip, sore throat, nonproductive cough for the occasional wheezing, feverish, but no documented fevers. She states that her chest is sore from all the coughing. She reports some shortness of breath with heavy exertion, but denies any other shortness of breath. No other chest pain. The symptoms started 3 days ago after being exposed to someone with the flu. Symptoms are better with Mucinex D, Delsym, Tylenol 1 g, etodolac. Symptoms are worse with exposure to cold air.  No  N/V, photophobia, neck pain, neck stiffness, ear pain, sinus pain/pressure, abd pain, back pain. rash, urinary c/o, skin infection, Slightly decreased appetite but is tolerating po.  Did  get flu shot this year. Has taken antipyretic in the past 4-6 hours. Past medical history of hypertension, hypercholesterolemia, fibromyalgia. No history of asthma, emphysema, COPD. She is not a smoker. No history of diabetes. [id number] Philbert Riser, MD   Past Medical History:  Diagnosis Date  . Allergy   . Fibromyalgia   . Hypertension   . Wrist pain     Past Surgical History:  Procedure Laterality Date  . BREAST BIOPSY Left    core- neg  . BREAST BIOPSY Left    stereo- neg  . ECTOPIC PREGNANCY SURGERY    . FOOT SURGERY      Family History  Problem Relation Age of Onset  . Hypertension Mother   . Diabetes Mother   . Hyperlipidemia Mother   . Cancer Mother   . Breast cancer Mother 40  . Hypertension Father   . Hyperlipidemia Father     Social History  Substance Use Topics  . Smoking status: Former Smoker    Quit date: 11/23/1984  . Smokeless tobacco: Never Used  . Alcohol use 6.0 oz/week    10 Glasses of wine per week    No current facility-administered medications for this encounter.   Current Outpatient Prescriptions:  .  Acetaminophen (TYLENOL ARTHRITIS PAIN PO),  Take by mouth., Disp: , Rfl:  .  atorvastatin (LIPITOR) 20 MG tablet, Take 1 tablet by mouth  daily, Disp: 90 tablet, Rfl: 3 .  b complex vitamins tablet, Take 1 tablet by mouth daily., Disp: , Rfl:  .  buPROPion (WELLBUTRIN XL) 300 MG 24 hr tablet, Take 1 tablet by mouth  daily, Disp: 90 tablet, Rfl: 3 .  Calcium-Vitamin D 600-200 MG-UNIT tablet, Take 1 tablet by mouth 2 (two) times daily. , Disp: , Rfl:  .  Cholecalciferol (VITAMIN D-1000 MAX ST) 1000 units tablet, Take 1,000 Units by mouth daily., Disp: , Rfl:  .  co-enzyme Q-10 50 MG capsule, Take 100 mg by mouth daily., Disp: , Rfl:  .  etodolac (LODINE XL) 500 MG 24 hr tablet, Take 1 tablet (500 mg total) by mouth 2 (two) times daily., Disp: 180 tablet, Rfl: 0 .  FOLIC ACID PO, Take 1 tablet by mouth daily. , Disp: , Rfl:  .  gabapentin (NEURONTIN) 600 MG tablet, Take 1 tablet (600 mg total) by mouth 3 (three) times daily., Disp: 270 tablet, Rfl: 3 .  lisinopril (PRINIVIL,ZESTRIL) 5 MG tablet, Take 1 tablet by mouth  daily, Disp: 90 tablet, Rfl: 3 .  loratadine (CLARITIN) 10 MG tablet, Take 10 mg by mouth daily. , Disp: , Rfl:  .  magnesium citrate SOLN, Take 1 Bottle by mouth once., Disp: , Rfl:  .  NIACIN CR  PO, Take 1 tablet by mouth daily. , Disp: , Rfl:  .  Omega-3 Fatty Acids (FISH OIL) 1000 MG CAPS, Take 1,200 mg by mouth daily. , Disp: , Rfl:  .  sertraline (ZOLOFT) 100 MG tablet, Take 1.5  tablet in the AM each day, Disp: 135 tablet, Rfl: 3 .  chlorpheniramine-HYDROcodone (TUSSIONEX PENNKINETIC ER) 10-8 MG/5ML SUER, Take 5 mLs by mouth 2 (two) times daily., Disp: 140 mL, Rfl: 0 .  mometasone (NASONEX) 50 MCG/ACT nasal spray, Place 2 sprays into the nose daily., Disp: 17 g, Rfl: 0 .  oseltamivir (TAMIFLU) 75 MG capsule, Take 1 capsule (75 mg total) by mouth 2 (two) times daily. X 5 days, Disp: 10 capsule, Rfl: 0  No Known Allergies   ROS  As noted in HPI.   Physical Exam  BP 123/74 (BP Location: Left Arm)   Pulse 80    Temp 99.2 F (37.3 C)   Resp 16   Ht 5\' 4"  (1.626 m)   Wt 220 lb (99.8 kg)   LMP  (Approximate)   SpO2 100%   BMI 37.76 kg/m   Constitutional: Well developed, well nourished, no acute distress Eyes: PERRL, EOMI, conjunctiva normal bilaterally HENT: Normocephalic, atraumatic,mucus membranes moist TMs normal bilaterally. Mild nasal congestion. No sinus tenderness. Minimal postnasal drip. No cobblestoning. Oropharynx otherwise unremarkable Neck: No cervical lymphadenopathy, no meningismus Respiratory: Clear to auscultation bilaterally, no rales, no wheezing, no rhonchi Cardiovascular: Normal rate and rhythm, no murmurs, no gallops, no rubs GI: Soft, nondistended, normal bowel sounds, nontender, no rebound, no guarding Back: no CVAT skin: No rash, skin intact Musculoskeletal: No deformities Neurologic: Alert & oriented x 3, CN II-XII grossly intact, no motor deficits, sensation grossly intact Psychiatric: Speech and behavior appropriate   ED Course   Medications - No data to display  Orders Placed This Encounter  Procedures  . Rapid Influenza A&B Antigens ([facility] only)    Standing Status:   Standing    Number of Occurrences:   1  . Droplet precaution    Standing Status:   Standing    Number of Occurrences:   1   Results for orders placed or performed during the hospital encounter of 02/01/16 (from the past 24 hour(s))  Rapid Influenza A&B Antigens ([place] only)     Status: Abnormal   Collection Time: 02/01/16  6:38 PM  Result Value Ref Range   Influenza A ([facility]) POSITIVE (A) NEGATIVE   Influenza B ([facility]) NEGATIVE NEGATIVE   No results found.  ED Clinical Impression  Influenza A  ED Assessment/Plan  Patient is influenza a positive. We'll send home with Tamiflu, Tussionex, Nasonex, she is to continue Mucinex D, Delsym, Tylenol 1 g of the 4 times a day, etodolac. She is to start saline nasal irrigation as well. Follow with PMD as needed. To the ER she gets worse. Discussed  labs,  MDM, plan and followup with patient. Discussed sn/sx that should prompt return to the ED. Patient agrees with plan.   Meds ordered this encounter  Medications  . oseltamivir (TAMIFLU) 75 MG capsule    Sig: Take 1 capsule (75 mg total) by mouth 2 (two) times daily. X 5 days    Dispense:  10 capsule    Refill:  0  . mometasone (NASONEX) 50 MCG/ACT nasal spray    Sig: Place 2 sprays into the nose daily.    Dispense:  17 g    Refill:  0  . chlorpheniramine-HYDROcodone (TUSSIONEX PENNKINETIC ER) 10-8 MG/5ML  SUER    Sig: Take 5 mLs by mouth 2 (two) times daily.    Dispense:  140 mL    Refill:  0    *This clinic note was created using [employer]. Therefore, there may be occasional mistakes despite careful proofreading.  ?   Melynda Ripple, MD 02/01/16 [phone]

## 2016-02-01 NOTE — ED Triage Notes (Signed)
Patient started having symptoms of body aches and nasal congestion 3 days ago. Symptoms of severe cough and sore throat are now present.

## 2016-02-18 ENCOUNTER — Other Ambulatory Visit: Payer: Self-pay | Admitting: Family Medicine

## 2016-02-29 ENCOUNTER — Ambulatory Visit (INDEPENDENT_AMBULATORY_CARE_PROVIDER_SITE_OTHER): Payer: Managed Care, Other (non HMO) | Admitting: Family Medicine

## 2016-02-29 ENCOUNTER — Encounter: Payer: Self-pay | Admitting: Family Medicine

## 2016-02-29 VITALS — BP 137/85 | HR 72 | Temp 98.3°F | Resp 16 | Ht 64.0 in

## 2016-02-29 DIAGNOSIS — G4700 Insomnia, unspecified: Secondary | ICD-10-CM | POA: Diagnosis not present

## 2016-02-29 DIAGNOSIS — R5382 Chronic fatigue, unspecified: Secondary | ICD-10-CM | POA: Diagnosis not present

## 2016-02-29 DIAGNOSIS — I10 Essential (primary) hypertension: Secondary | ICD-10-CM

## 2016-02-29 DIAGNOSIS — F330 Major depressive disorder, recurrent, mild: Secondary | ICD-10-CM

## 2016-02-29 DIAGNOSIS — M797 Fibromyalgia: Secondary | ICD-10-CM

## 2016-02-29 MED ORDER — NORTRIPTYLINE HCL 10 MG PO CAPS: ORAL_CAPSULE | ORAL | 3 refills | Status: DC

## 2016-02-29 NOTE — Progress Notes (Signed)
Name: Rachel Townsend   MRN: [id number]    DOB: 1960/12/22   Date:02/29/2016       Progress Note  Subjective  Chief Complaint  Chief Complaint  Patient presents with  . Fibromyalgia  . Depression  . Insomnia    HPI Here for f/u of fibromyalgia, depression and insomnia.  Having very vivid reams that awaken her.  Also awakening other times also.  Depression about the same.  Fibromyalgia is not doing any better.  No problem-specific Assessment & Plan notes found for this encounter.   Past Medical History:  Diagnosis Date  . Allergy   . Fibromyalgia   . Hypertension   . Wrist pain     Past Surgical History:  Procedure Laterality Date  . BREAST BIOPSY Left    core- neg  . BREAST BIOPSY Left    stereo- neg  . ECTOPIC PREGNANCY SURGERY    . FOOT SURGERY      Family History  Problem Relation Age of Onset  . Hypertension Mother   . Diabetes Mother   . Hyperlipidemia Mother   . Cancer Mother   . Breast cancer Mother 38  . Hypertension Father   . Hyperlipidemia Father     Social History   Social History  . Marital status: Married    Spouse name: N/A  . Number of children: N/A  . Years of education: N/A   Occupational History  . Not on file.   Social History Main Topics  . Smoking status: Former Smoker    Quit date: 11/23/1984  . Smokeless tobacco: Never Used  . Alcohol use 6.0 oz/week    10 Glasses of wine per week  . Drug use: No  . Sexual activity: Yes    Birth control/ protection: Injection   Other Topics Concern  . Not on file   Social History Narrative  . No narrative on file     Current Outpatient Prescriptions:  .  Acetaminophen (TYLENOL ARTHRITIS PAIN PO), Take by mouth., Disp: , Rfl:  .  atorvastatin (LIPITOR) 20 MG tablet, Take 1 tablet by mouth  daily, Disp: 90 tablet, Rfl: 3 .  b complex vitamins tablet, Take 1 tablet by mouth daily., Disp: , Rfl:  .  buPROPion (WELLBUTRIN XL) 300 MG 24 hr tablet, Take 1 tablet by mouth  daily, Disp: 90  tablet, Rfl: 3 .  Calcium-Vitamin D 600-200 MG-UNIT tablet, Take 1 tablet by mouth 2 (two) times daily. , Disp: , Rfl:  .  Cholecalciferol (VITAMIN D-1000 MAX ST) 1000 units tablet, Take 1,000 Units by mouth daily., Disp: , Rfl:  .  co-enzyme Q-10 50 MG capsule, Take 100 mg by mouth daily., Disp: , Rfl:  .  etodolac (LODINE XL) 500 MG 24 hr tablet, TAKE 1 TABLET BY MOUTH TWO  TIMES DAILY, Disp: 180 tablet, Rfl: 3 .  FOLIC ACID PO, Take 1 tablet by mouth daily. , Disp: , Rfl:  .  gabapentin (NEURONTIN) 600 MG tablet, Take 1 tablet (600 mg total) by mouth 3 (three) times daily., Disp: 270 tablet, Rfl: 3 .  lisinopril (PRINIVIL,ZESTRIL) 5 MG tablet, Take 1 tablet by mouth  daily, Disp: 90 tablet, Rfl: 3 .  loratadine (CLARITIN) 10 MG tablet, Take 10 mg by mouth daily. , Disp: , Rfl:  .  mometasone (NASONEX) 50 MCG/ACT nasal spray, Place 2 sprays into the nose daily., Disp: 17 g, Rfl: 0 .  NIACIN CR PO, Take 1 tablet by mouth daily. , Disp: , Rfl:  .  Omega-3 Fatty Acids (FISH OIL) 1000 MG CAPS, Take 1,200 mg by mouth daily. , Disp: , Rfl:  .  nortriptyline (PAMELOR) 10 MG capsule, Take 2 each night as directed ., Disp: 60 capsule, Rfl: 3  Not on File   Review of Systems  Constitutional: Negative for chills, fever, malaise/fatigue and weight loss.  HENT: Negative for hearing loss and tinnitus.   Eyes: Negative for blurred vision and double vision.  Respiratory: Negative for cough, shortness of breath and wheezing.   Cardiovascular: Negative for chest pain, palpitations and leg swelling.  Gastrointestinal: Negative for abdominal pain, blood in stool and heartburn.  Genitourinary: Negative for dysuria, frequency and urgency.  Musculoskeletal: Positive for joint pain and myalgias.  Skin: Negative for rash.  Neurological: Negative for dizziness, tingling, tremors, weakness and headaches.      Objective  Vitals:   02/29/16 1344  BP: 137/85  Pulse: 72  Resp: 16  Temp: 98.3 F (36.8 C)   TempSrc: Oral  Height: 5\' 4"  (1.626 m)    Physical Exam  Constitutional: She is oriented to person, place, and time and well-developed, well-nourished, and in no distress. No distress.  HENT:  Head: Normocephalic and atraumatic.  Eyes: Conjunctivae and EOM are normal. Pupils are equal, round, and reactive to light. No scleral icterus.  Neck: Normal range of motion. Neck supple. Carotid bruit is not present. No thyromegaly present.  Cardiovascular: Normal rate, regular rhythm and normal heart sounds.  Exam reveals no gallop and no friction rub.   No murmur heard. Pulmonary/Chest: Effort normal and breath sounds normal. No respiratory distress. She has no wheezes. She has no rales.  Musculoskeletal: She exhibits no edema.  Multiple fibromyalgia trigger points are positive.  Lymphadenopathy:    She has no cervical adenopathy.  Neurological: She is alert and oriented to person, place, and time.  Psychiatric:  Anxious and depressed  Vitals reviewed.      Recent Results (from the past 2160 hour(s))  Rapid Influenza A&B Antigens ([place] only)     Status: Abnormal   Collection Time: 02/01/16  6:38 PM  Result Value Ref Range   Influenza A ([facility]) POSITIVE (A) NEGATIVE   Influenza B ([facility]) NEGATIVE NEGATIVE     Assessment & Plan  Problem List Items Addressed This Visit      Cardiovascular and Mediastinum   Hypertension     Other   Fibromyalgia   Depression - Primary   Relevant Medications   nortriptyline (PAMELOR) 10 MG capsule   Insomnia   Chronic fatigue      Meds ordered this encounter  Medications  . nortriptyline (PAMELOR) 10 MG capsule    Sig: Take 2 each night as directed .    Dispense:  60 capsule    Refill:  3   1. Mild episode of recurrent major depressive disorder (HCC) Discontinue Zoloft Cont Wellburin - nortriptyline (PAMELOR) 10 MG capsule; Take 2 each night as directed .  Dispense: 60 capsule; Refill: 3  2. Fibromyalgia Cont Gabapentin  3.  Chronic fatigue   4. Insomnia, unspecified type   5. Essential hypertension Cont Lisinopril

## 2016-03-28 ENCOUNTER — Ambulatory Visit (INDEPENDENT_AMBULATORY_CARE_PROVIDER_SITE_OTHER): Payer: Managed Care, Other (non HMO) | Admitting: Family Medicine

## 2016-03-28 ENCOUNTER — Encounter: Payer: Self-pay | Admitting: Family Medicine

## 2016-03-28 DIAGNOSIS — F330 Major depressive disorder, recurrent, mild: Secondary | ICD-10-CM | POA: Diagnosis not present

## 2016-03-28 MED ORDER — NORTRIPTYLINE HCL 10 MG PO CAPS: ORAL_CAPSULE | ORAL | 6 refills | Status: DC

## 2016-03-28 NOTE — Progress Notes (Signed)
Name: Rachel Townsend   MRN: [id number]    DOB: [birth year]   Date:03/28/2016       Progress Note  Subjective  Chief Complaint  Chief Complaint  Patient presents with  . Depression  . Insomnia    HPI Here for f/u of depression and insomnia and fibromyalgia.  She tolerated the 20 mg of Pamelor, but is more emotional, doesn't sleep any better and her fibromyalgia has gotten a  little worse.  No problem-specific Assessment & Plan notes found for this encounter.   Past Medical History:  Diagnosis Date  . Allergy   . Fibromyalgia   . Hypertension   . Wrist pain     Past Surgical History:  Procedure Laterality Date  . BREAST BIOPSY Left    core- neg  . BREAST BIOPSY Left    stereo- neg  . ECTOPIC PREGNANCY SURGERY    . FOOT SURGERY      Family History  Problem Relation Age of Onset  . Hypertension Mother   . Diabetes Mother   . Hyperlipidemia Mother   . Cancer Mother   . Breast cancer Mother 91  . Hypertension Father   . Hyperlipidemia Father     Social History   Social History  . Marital status: Married    Spouse name: N/A  . Number of children: N/A  . Years of education: N/A   Occupational History  . Not on file.   Social History Main Topics  . Smoking status: Former Smoker    Quit date: 11/23/1984  . Smokeless tobacco: Never Used  . Alcohol use 6.0 oz/week    10 Glasses of wine per week  . Drug use: No  . Sexual activity: Yes    Birth control/ protection: Injection   Other Topics Concern  . Not on file   Social History Narrative  . No narrative on file     Current Outpatient Prescriptions:  .  Acetaminophen (TYLENOL ARTHRITIS PAIN PO), Take by mouth., Disp: , Rfl:  .  atorvastatin (LIPITOR) 20 MG tablet, Take 1 tablet by mouth  daily, Disp: 90 tablet, Rfl: 3 .  b complex vitamins tablet, Take 1 tablet by mouth daily., Disp: , Rfl:  .  buPROPion (WELLBUTRIN XL) 300 MG 24 hr tablet, Take 1 tablet by mouth  daily, Disp: 90 tablet, Rfl: 3 .   Calcium-Vitamin D 600-200 MG-UNIT tablet, Take 1 tablet by mouth 2 (two) times daily. , Disp: , Rfl:  .  Cholecalciferol (VITAMIN D-1000 MAX ST) 1000 units tablet, Take 1,000 Units by mouth daily., Disp: , Rfl:  .  co-enzyme Q-10 50 MG capsule, Take 100 mg by mouth daily., Disp: , Rfl:  .  etodolac (LODINE XL) 500 MG 24 hr tablet, TAKE 1 TABLET BY MOUTH TWO  TIMES DAILY, Disp: 180 tablet, Rfl: 3 .  FOLIC ACID PO, Take 1 tablet by mouth daily. , Disp: , Rfl:  .  gabapentin (NEURONTIN) 600 MG tablet, Take 1 tablet (600 mg total) by mouth 3 (three) times daily., Disp: 270 tablet, Rfl: 3 .  lisinopril (PRINIVIL,ZESTRIL) 5 MG tablet, Take 1 tablet by mouth  daily, Disp: 90 tablet, Rfl: 3 .  loratadine (CLARITIN) 10 MG tablet, Take 10 mg by mouth daily. , Disp: , Rfl:  .  NIACIN CR PO, Take 1 tablet by mouth daily. , Disp: , Rfl:  .  nortriptyline (PAMELOR) 10 MG capsule, Take 4 each night as directed ., Disp: 120 capsule, Rfl: 6 .  Omega-3  Fatty Acids (FISH OIL) 1000 MG CAPS, Take 1,200 mg by mouth daily. , Disp: , Rfl:   No Known Allergies   Review of Systems  Constitutional: Negative for chills, fever, malaise/fatigue and weight loss.  HENT: Negative for hearing loss and tinnitus.   Eyes: Negative for blurred vision and double vision.  Respiratory: Negative for cough, shortness of breath and wheezing.   Cardiovascular: Negative for chest pain, palpitations and leg swelling.  Gastrointestinal: Negative for abdominal pain, blood in stool and heartburn.  Genitourinary: Negative for dysuria, frequency and urgency.  Musculoskeletal: Positive for joint pain and myalgias.  Skin: Negative for rash.  Neurological: Negative for dizziness, tingling, tremors, weakness and headaches.  Psychiatric/Behavioral: Positive for depression. The patient has insomnia.       Objective  Vitals:   03/28/16 0842  BP: 120/75  Pulse: 82  Resp: 16  Temp: 98.3 F (36.8 C)  TempSrc: Oral  Weight: 226 lb (102.5  kg)  Height: 5\' 4"  (1.626 m)    Physical Exam  Constitutional: She is oriented to person, place, and time and well-developed, well-nourished, and in no distress. No distress.  HENT:  Head: Normocephalic and atraumatic.  Eyes: Conjunctivae and EOM are normal. Pupils are equal, round, and reactive to light. No scleral icterus.  Neck: Normal range of motion. Neck supple. Carotid bruit is not present. No thyromegaly present.  Cardiovascular: Normal rate, regular rhythm and normal heart sounds.  Exam reveals no gallop and no friction rub.   No murmur heard. Pulmonary/Chest: Effort normal and breath sounds normal. No respiratory distress. She has no wheezes. She has no rales.  Abdominal: Soft. Bowel sounds are normal. She exhibits no distension and no mass. There is no tenderness.  Musculoskeletal: She exhibits no edema.  Lymphadenopathy:    She has no cervical adenopathy.  Neurological: She is alert and oriented to person, place, and time.  Psychiatric:  Affect is depresed and she cries easily.  Vitals reviewed.      Recent Results (from the past 2160 hour(s))  Rapid Influenza A&B Antigens ([place] only)     Status: Abnormal   Collection Time: 02/01/16  6:38 PM  Result Value Ref Range   Influenza A ([facility]) POSITIVE (A) NEGATIVE   Influenza B ([facility]) NEGATIVE NEGATIVE     Assessment & Plan  Problem List Items Addressed This Visit      Other   Depression   Relevant Medications   nortriptyline (PAMELOR) 10 MG capsule      Meds ordered this encounter  Medications  . nortriptyline (PAMELOR) 10 MG capsule    Sig: Take 4 each night as directed .    Dispense:  120 capsule    Refill:  6    1. Mild episode of recurrent major depressive disorder (HCC) Cont Welbutrin - nortriptyline (PAMELOR) 10 MG capsule; Take 4 each night as directed .  Dispense: 120 capsule; Refill: 6

## 2016-04-07 ENCOUNTER — Other Ambulatory Visit: Payer: Self-pay | Admitting: Family Medicine

## 2016-04-11 ENCOUNTER — Encounter: Payer: Self-pay | Admitting: *Deleted

## 2016-04-11 ENCOUNTER — Ambulatory Visit
Admission: EM | Admit: 2016-04-11 | Discharge: 2016-04-11 | Disposition: A | Discharge status: Home / Self Care | Payer: Managed Care, Other (non HMO) | Attending: Family Medicine | Admitting: Family Medicine

## 2016-04-11 DIAGNOSIS — J040 Acute laryngitis: Secondary | ICD-10-CM

## 2016-04-11 DIAGNOSIS — J0410 Acute tracheitis without obstruction: Secondary | ICD-10-CM | POA: Diagnosis not present

## 2016-04-11 LAB — RAPID STREP SCREEN (MED CTR MEBANE ONLY): Streptococcus, Group A Screen (Direct): NEGATIVE

## 2016-04-11 MED ORDER — HYDROCOD POLST-CPM POLST ER 10-8 MG/5ML PO SUER: 5.0000 mL | Freq: Two times a day (BID) | ORAL | 0 refills | Status: DC | PRN

## 2016-04-11 MED ORDER — AZITHROMYCIN 250 MG PO TABS: ORAL_TABLET | ORAL | 0 refills | Status: DC

## 2016-04-11 MED ORDER — ALBUTEROL SULFATE HFA 108 (90 BASE) MCG/ACT IN AERS: 2.0000 | INHALATION_SPRAY | RESPIRATORY_TRACT | 0 refills | Status: DC | PRN

## 2016-04-11 NOTE — ED Triage Notes (Signed)
Non-productive cough, sore throat, chills, body aches, possible fever, since last Thursday.

## 2016-04-11 NOTE — ED Provider Notes (Signed)
[facility]    CSN: [id number] Arrival date & time: 04/11/16  1706     History   Chief Complaint Chief Complaint  Patient presents with  . Sore Throat  . Cough  . Generalized Body Aches  . Chills    HPI Rachel Townsend is a 56 y.o. female.   She reports sore throat and hoarseness she states that the sore throat seems to be getting worse as is moving down to her chest. She is also no some wheezing as well. She states she does not smoke she states normally when she has a sore throat a few days should expect things get better and they have not. She has a history of fibromyalgia hypertension and allergies. She also has a history of sciatica chronic fatigue and obesity. Family history is positive for hypertension diabetes and anemia mother also had breast cancer. She is a former smoker. No known drug allergies.   The history is provided by the patient.  Sore Throat  This is a new problem. The current episode started more than 2 days ago. The problem occurs constantly. The problem has been gradually worsening. Pertinent negatives include no chest pain, no headaches and no shortness of breath. The symptoms are aggravated by exertion. Nothing relieves the symptoms.  Cough  Associated symptoms: wheezing   Associated symptoms: no chest pain, no headaches and no shortness of breath     Past Medical History:  Diagnosis Date  . Allergy   . Fibromyalgia   . Hypertension   . Wrist pain     Patient Active Problem List   Diagnosis Date Noted  . Chronic fatigue 08/17/2015  . Sciatica 05/14/2015  . Obesity 03/02/2015  . Periodic health assessment, general screening, adult 11/24/2014  . Need for influenza vaccination 11/24/2014  . Need for Tdap vaccination 11/24/2014  . Fibromyalgia 07/16/2014  . Depression 07/16/2014  . Insomnia 07/16/2014  . Hypertension 07/16/2014    Past Surgical History:  Procedure Laterality Date  . BREAST BIOPSY Left    core- neg  . BREAST  BIOPSY Left    stereo- neg  . ECTOPIC PREGNANCY SURGERY    . FOOT SURGERY      OB History    No data available       Home Medications    Prior to Admission medications   Medication Sig Start Date End Date Taking? Authorizing Provider  Acetaminophen (TYLENOL ARTHRITIS PAIN PO) Take by mouth.   Yes Historical Provider, MD  atorvastatin (LIPITOR) 20 MG tablet Take 1 tablet by mouth  daily 05/21/15  Yes Arlis Porta., MD  b complex vitamins tablet Take 1 tablet by mouth daily.   Yes Historical Provider, MD  buPROPion (WELLBUTRIN XL) 300 MG 24 hr tablet TAKE 1 TABLET BY MOUTH  DAILY 04/07/16  Yes Arlis Porta., MD  Calcium-Vitamin D 600-200 MG-UNIT tablet Take 1 tablet by mouth 2 (two) times daily.    Yes Historical Provider, MD  Cholecalciferol (VITAMIN D-1000 MAX ST) 1000 units tablet Take 1,000 Units by mouth daily.   Yes Historical Provider, MD  co-enzyme Q-10 50 MG capsule Take 100 mg by mouth daily.   Yes Historical Provider, MD  etodolac (LODINE XL) 500 MG 24 hr tablet TAKE 1 TABLET BY MOUTH TWO  TIMES DAILY 02/19/16  Yes Arlis Porta., MD  FOLIC ACID PO Take 1 tablet by mouth daily.    Yes Historical Provider, MD  gabapentin (NEURONTIN) 600 MG tablet Take  1 tablet (600 mg total) by mouth 3 (three) times daily. 08/17/15  Yes Arlis Porta., MD  lisinopril (PRINIVIL,ZESTRIL) 5 MG tablet Take 1 tablet by mouth  daily 04/27/15  Yes Arlis Porta., MD  loratadine (CLARITIN) 10 MG tablet Take 10 mg by mouth daily.    Yes Historical Provider, MD  NIACIN CR PO Take 1 tablet by mouth daily.    Yes Historical Provider, MD  nortriptyline (PAMELOR) 10 MG capsule Take 4 each night as directed . 03/28/16  Yes Arlis Porta., MD  Omega-3 Fatty Acids (FISH OIL) 1000 MG CAPS Take 1,200 mg by mouth daily.    Yes Historical Provider, MD  albuterol (PROVENTIL HFA;VENTOLIN HFA) 108 (90 Base) MCG/ACT inhaler Inhale 2 puffs into the lungs every 4 (four) hours as needed for  wheezing or shortness of breath. 04/11/16   Frederich Cha, MD  azithromycin (ZITHROMAX Z-PAK) 250 MG tablet Take 2 tablets first day and then 1 po a day for 4 days 04/11/16   Frederich Cha, MD  chlorpheniramine-HYDROcodone [facility]) 10-8 MG/5ML SUER Take 5 mLs by mouth every 12 (twelve) hours as needed. 04/11/16   Frederich Cha, MD    Family History Family History  Problem Relation Age of Onset  . Hypertension Mother   . Diabetes Mother   . Hyperlipidemia Mother   . Cancer Mother   . Breast cancer Mother 83  . Hypertension Father   . Hyperlipidemia Father     Social History Social History  Substance Use Topics  . Smoking status: Former Smoker    Quit date: 11/23/1984  . Smokeless tobacco: Never Used  . Alcohol use 6.0 oz/week    10 Glasses of wine per week     Allergies   Patient has no known allergies.   Review of Systems Review of Systems  HENT: Positive for congestion and voice change.   Respiratory: Positive for cough and wheezing. Negative for shortness of breath.   Cardiovascular: Negative for chest pain and palpitations.  Musculoskeletal: Negative for arthralgias.  Neurological: Negative for headaches.  All other systems reviewed and are negative.    Physical Exam Triage Vital Signs ED Triage Vitals  Enc Vitals Group     BP 04/11/16 1757 133/69     Pulse Rate 04/11/16 1757 94     Resp 04/11/16 1757 16     Temp 04/11/16 1757 98 F (36.7 C)     Temp Source 04/11/16 1757 Oral     SpO2 04/11/16 1757 100 %     Weight 04/11/16 1758 224 lb (101.6 kg)     Height 04/11/16 1758 5\' 4"  (1.626 m)     Head Circumference --      Peak Flow --      Pain Score --      Pain Loc --      Pain Edu? --      Excl. in [place]? --    No data found.   Updated Vital Signs BP 133/69 (BP Location: Left Arm)   Pulse 94   Temp 98 F (36.7 C) (Oral)   Resp 16   Ht 5\' 4"  (1.626 m)   Wt 224 lb (101.6 kg)   SpO2 100%   BMI 38.45 kg/m   Visual Acuity Right Eye  Distance:   Left Eye Distance:   Bilateral Distance:    Right Eye Near:   Left Eye Near:    Bilateral Near:     Physical  Exam  Constitutional: She is oriented to person, place, and time. She appears well-developed and well-nourished.  HENT:  Head: Normocephalic and atraumatic.  Right Ear: Hearing, tympanic membrane, external ear and ear canal normal.  Left Ear: Hearing, tympanic membrane, external ear and ear canal normal.  Nose: Mucosal edema present.  Mouth/Throat: Uvula is midline. Posterior oropharyngeal erythema present.  Eyes: EOM and lids are normal. Pupils are equal, round, and reactive to light.  Neck: Trachea normal and normal range of motion.  Cardiovascular: Normal rate and regular rhythm.   Pulmonary/Chest: Effort normal and breath sounds normal. No respiratory distress. She has no wheezes.  Musculoskeletal: Normal range of motion. She exhibits no edema.  Lymphadenopathy:    She has cervical adenopathy.  Neurological: She is alert and oriented to person, place, and time.  Psychiatric: She has a normal mood and affect.  Vitals reviewed.    UC Treatments / Results  Labs (all labs ordered are listed, but only abnormal results are displayed) Labs Reviewed  RAPID STREP SCREEN (NOT AT [facility])  CULTURE, GROUP A STREP [facility])    EKG  EKG Interpretation None       Radiology No results found.  Procedures Procedures (including critical care time)  Medications Ordered in UC Medications - No data to display . Results for orders placed or performed during the hospital encounter of 04/11/16  Rapid strep screen  Result Value Ref Range   Streptococcus, Group A Screen (Direct) NEGATIVE NEGATIVE    Initial Impression / Assessment and Plan / UC Course  I have reviewed the triage vital signs and the nursing notes.  Pertinent labs & imaging results that were available during my care of the patient were reviewed by me and considered in my medical decision making (see  chart for details).   when patient strep test was negative but since she's developing a laryngitis/tracheitis and seems again worse we will treat her low more aggressively than usual. We'll place her on a Z-Pak and Tussionex for the cough 1 teaspoon twice a day and albuterol inhaler to use on a regular basis for wheezing. PCP in a week if not better   Final Clinical Impressions(s) / UC Diagnoses   Final diagnoses:  Acute laryngitis  Acute tracheitis without airway obstruction    New Prescriptions Discharge Medication List as of 04/11/2016  7:19 PM    START taking these medications   Details  albuterol (PROVENTIL HFA;VENTOLIN HFA) 108 (90 Base) MCG/ACT inhaler Inhale 2 puffs into the lungs every 4 (four) hours as needed for wheezing or shortness of breath., Starting Mon 04/11/2016, Print    azithromycin (ZITHROMAX Z-PAK) 250 MG tablet Take 2 tablets first day and then 1 po a day for 4 days, Print    chlorpheniramine-HYDROcodone (TUSSIONEX PENNKINETIC ER) 10-8 MG/5ML SUER Take 5 mLs by mouth every 12 (twelve) hours as needed., Starting Mon 04/11/2016, Normal         Note: This dictation was prepared with Dragon dictation along with smaller phrase technology. Any transcriptional errors that result from this process are unintentional.   Frederich Cha, MD 04/11/16 [id number]

## 2016-04-14 LAB — CULTURE, GROUP A STREP (THRC)

## 2016-05-02 ENCOUNTER — Ambulatory Visit: Payer: Managed Care, Other (non HMO) | Admitting: Family Medicine

## 2016-05-04 ENCOUNTER — Ambulatory Visit (INDEPENDENT_AMBULATORY_CARE_PROVIDER_SITE_OTHER): Payer: Managed Care, Other (non HMO) | Admitting: Family Medicine

## 2016-05-04 ENCOUNTER — Encounter: Payer: Self-pay | Admitting: Family Medicine

## 2016-05-04 VITALS — BP 144/80 | HR 79 | Temp 98.1°F | Resp 16 | Ht 64.0 in | Wt 226.6 lb

## 2016-05-04 DIAGNOSIS — E669 Obesity, unspecified: Secondary | ICD-10-CM | POA: Diagnosis not present

## 2016-05-04 DIAGNOSIS — M797 Fibromyalgia: Secondary | ICD-10-CM | POA: Diagnosis not present

## 2016-05-04 DIAGNOSIS — Z8669 Personal history of other diseases of the nervous system and sense organs: Secondary | ICD-10-CM | POA: Diagnosis not present

## 2016-05-04 DIAGNOSIS — Z9989 Dependence on other enabling machines and devices: Secondary | ICD-10-CM

## 2016-05-04 DIAGNOSIS — F331 Major depressive disorder, recurrent, moderate: Secondary | ICD-10-CM

## 2016-05-04 DIAGNOSIS — G4733 Obstructive sleep apnea (adult) (pediatric): Secondary | ICD-10-CM | POA: Insufficient documentation

## 2016-05-04 DIAGNOSIS — I10 Essential (primary) hypertension: Secondary | ICD-10-CM | POA: Diagnosis not present

## 2016-05-04 DIAGNOSIS — F99 Mental disorder, not otherwise specified: Secondary | ICD-10-CM

## 2016-05-04 DIAGNOSIS — F5105 Insomnia due to other mental disorder: Secondary | ICD-10-CM

## 2016-05-04 NOTE — Assessment & Plan Note (Addendum)
Currently worsening, chronic problem with MDD >10 years, closely related to fibromyalgia pain and chronic poor sleep insomnia, and likely OSA from prior diagnosis. - Failed various meds including Gabapentin, TCA (Nortriptyline), SSRI (Lexapro, Zoloft), SNRI (Cymbalta, Venlafaxine) - unsure if other SSRI tried  Plan: 1. Treat underlying depression, fibromyalgia - given multiple meds and side effects, currently will taper off Nortriptyline and remain off Zoloft, decision to hold on new therapy today 2. Continue Wellbutrin XL 300mg  daily in AM - caution that side effect is insomnia 3. Will add Flexeril 5-10mg  TID PRN prefer QHS use for help sleep for now and control fibromyalgia pain 4. Limited other options for managing depression at this time other than other trial SSRI (fluoxetine, paxil, fluvoxamine, some indications for fibromyalgia as well) 5. Considered referral to Psychiatry, but patient defers this second opinion for now, will start with sleep evaluation, referral to Dr Nehemiah Settle for sleep, may need sleep study if can get approved for likely CPAP, suspect controlling OSA will help major depression as well

## 2016-05-04 NOTE — Assessment & Plan Note (Signed)
Gradual worsening chronic problem closely related to major depression and poor sleep with insomnia and likely OSA - Chronic problem >10 years. Limited relief on various therapy, see other A&P - Failed various meds including Gabapentin, TCA (Nortriptyline), SSRI (Lexapro, Zoloft), SNRI (Cymbalta, Venlafaxine) - unsure if other SSRI tried  Plan: 1. Treat underlying depression/insomnia to ultimately improve fibromyalgia - given multiple meds and side effects, currently will taper off Nortriptyline and remain off Zoloft, decision to hold on new therapy today by patient request 2. Will add Flexeril 5-10mg  TID PRN prefer QHS use for help sleep for now and control fibromyalgia pain - ideally if tolerates can take regularly, or may need to switch to less sedating agent such as Baclofen during the day 3. Future discuss other medication options such as Savella specifically for fibro (SNRI), and she is interested in second opinion from possible Psychiatry or specialist with insight into Fibromyalgia

## 2016-05-04 NOTE — Patient Instructions (Signed)
Thank you for coming in to clinic today.  1. Referral to Sleep Medicine - Dr Nehemiah Settle for further evaluation of sleep, including discussion of likely OSA and trying to get sleep study approved, also with concerns of Fibromyalgia / Depression worse with poor sleep  [place]  Address: [address], [place], [place], [place] [address]    Phone: [phone]  Stay tuned for an appointment, if you do not hear back you can call them within next 2 weeks to check status.  --------------------------------  Sleep Hygiene Recommendations to promote healthy sleep in all patients, especially if symptoms of insomnia are worsening. Due to the nature of sleep rhythms, if your body gets "out of rhythm", it may take some time before your sleep cycle can be "reset".  Please try to follow as many of the following tips as you can, usually there are only a few of these are the primary cause of the problem.  ?To reset your sleep rhythm, go to bed and get up at the same time every day ?Sleep only long enough to feel rested and then get out of bed ?Do not try to force yourself to sleep. If you can't sleep, get out of bed and try again later.  ?Have coffee, tea, and other foods that have caffeine only in the morning ?Exercise several days a week, but not right before bed ?If you drink alcohol, prefer to have appropriate drink with one meal, but prefer to avoid alcohol in the evening, and bedtime ?If you smoke, avoid smoking, especially in the evening  ?Avoid watching TV or looking at phones, computers, or reading devices ("e-books") that give off light at least 30 minutes before bed. This artificial light sends "awake signals" to your brain and can make it harder to fall asleep. ?Keep your bedroom dark, cool, quiet, and free of reminders of other things that cause you stress ?Try your best to solve or at least address your problems before you go to  bed   ------------------------- Taper off Nortriptyline, decrease to 30mg  already for first couple nights, then down by 10mg  every 2-3 nights, by 1 week should be off  Remain off Zoloft  Continue Wellbutrin  Read or look into the following other depression medications Fluoxetine ?Paroxetine ?Fluvoxamine    Also specifically for Fibromyalgia - consider Savella, this is indicated for Fibromyalgia, it may be expensive ------------------------------------- Will hold off on Psychiatry referral at this time, may see if we can find Fibromyalgia specialist as alternative option.  -------------------------------------  Please schedule a follow-up appointment with Dr. Parks Ranger in 6 weeks to 3 months follow-up Depression/PHQ / Insomnia / Fibromyalgia  If you have any other questions or concerns, please feel free to call the clinic or send a message through [facility]. You may also schedule an earlier appointment if necessary.  [name], DO [place]

## 2016-05-04 NOTE — Assessment & Plan Note (Signed)
Slightly elevated BP today, had been better controlled No complications, likely elevated BP in setting of uncontrolled OSA with prior history now off CPAP  Plan: 1. Continue current med Lisinopril -if need to adjust will do in future follow-up

## 2016-05-04 NOTE — Assessment & Plan Note (Signed)
Likely secondary and closely related to major depression and fibromyalgia, chronic problem >10 years. Limited relief on various therapy, see other A&P - Never on dedicated hypnotic sleep medication (such as Ambien etc) - Concern with prior history of OSA contributing as well, now off CPAP due to lack of insurance coverage for sleep study  Plan: 1. Treat underlying depression, fibromyalgia - given multiple meds and side effects, currently will taper off Nortriptyline and remain off Zoloft, decision to hold on new therapy today 2. Will add Flexeril 5-10mg  TID PRN prefer QHS use for help sleep for now and control fibromyalgia pain 3. Referral to Dr Nehemiah Settle, sleep medicine specialist for further evaluation and possibly proceed with sleep study to get CPAP approved, can consider other hypnotic agents, as patient would like this second opinion first and is not interested in trying more new medications today

## 2016-05-04 NOTE — Assessment & Plan Note (Addendum)
Worsening weight gain overall >1 year, attributed to stress eating and limited lifestyle factors, sometimes limited by generalized pain with less exercise - Comorbid HTN, only mild abnormal cholesterol in past, normal glucose previosly  Encouraged to work on gradually improving regular exercise as tolerated

## 2016-05-04 NOTE — Progress Notes (Signed)
Subjective:    Patient ID: Rachel Townsend, female    DOB: 1960/09/29, 56 y.o.   MRN: [id number]  Rachel Townsend is a 56 y.o. female presenting on 05/04/2016 for Fibromyalgia (medication)   HPI   DEPRESSION / INSOMNIA / FIBROMYALGIA - Chronic problems, today meeting new provider provides additional background information. Initial diagnosis of depression >10 years ago along with fibromyalgia, she was started on Lexapro and Gabapentin, she was switched back between SSRI SNRI multiple times Lexapro to Cymbalta, possibly one other SSRI. Overall she has mixed results from these meds, Lexapro (mood and mind seem better controlled but pain worse) and Cymbalta (felt "crazier" less controlled but pain better), also tried Venlafaxine she believes. Most recently within past 1-2 years has been on Zoloft, increased doses up to 100-150mg  daily without significant relief. - Recent interval history at last two PCP visits in 02/2016 and then 03/2016, switched from Zoloft to new medication Nortriptyline (for sleep and pain) with graduation titration up to 10mg  up to 40mg  max dose at night, she did not have benefit from 10-20mg , then had significant side effects at higher doses 30-40mg  after >2 months, mostly with dry mouth and ineffective results - Today she is still taking it but plans to discontinue, asking about tapering off from 40mg . She is frustrated with medication failures and wants to come off of the Nortriptyline and not resume Zoloft or other SSRI/SNRI today. - Still taking Wellbutrin XL 300mg  daily in morning, she was not aware this can cause some insomnia - Additional history with Insomnia, >10 years, used to have some difficulty falling asleep, now that is improved, mostly has problem sleeping through the night. She has never been on sleeping agent such as Ambien, Belsomra, Lunesta or others. Does admit to some vivid dreams in past that wake her up. - Describes generalized pains from fibromyalgia in muscles,  has not tried other pain medicines for this, never on muscle relaxant for regular use - She has never seen Psychiatry for these problems.  Suspected Sleep Apnea / History of OSA, no longer on active CPAP - Reports prior history of diagnosed OSA >10 years ago out of state in [place] after sleep study, and she was improved on CPAP machine, then by time she moved to [place], her CPAP was no longer functioning properly, she states insurance did not cover sleep center test, and preferred Home Sleep Study, but the test was "inconclusive" and then it was not covered either. Now she remains off CPAP machine, and still experiences problems with occasional apnea events, and poor sleep overnight contributing to her worsening mood and fibromyalgia. - Admits significant weight gain over past few years as well, attributes some to stress eating  PMH Hypertension - taking Lisinopril 5mg  daily  -----------------------------------------------------------  Epworth Sleepiness Scale Total Score: 9 (Positive score) Sitting and reading - 1 Watching TV - 3 Sitting inactive in a public place - 0 As a passenger in a car for an hour without a break - 2 Lying down to rest in the afternoon when circumstances permit - 2 Sitting and talking to someone - 0 Sitting quietly after a lunch without alcohol - 1 In a car, while stopped for a few minutes in traffic - 0  Depression screen [facility] 2/9 05/04/2016 03/28/2016 02/29/2016  Decreased Interest 1 1 1   Down, Depressed, Hopeless 1 1 1   PHQ - 2 Score 2 2 2   Altered sleeping 3 3 3   Tired, decreased energy 3 3 3   Change in  appetite 3 3 3   Feeling bad or failure about yourself  2 0 0  Trouble concentrating 1 0 0  Moving slowly or fidgety/restless 1 0 0  Suicidal thoughts 0 0 0  PHQ-9 Score 15 11 11   Difficult doing work/chores Somewhat difficult Not difficult at all Somewhat difficult    Social History  Substance Use Topics  . Smoking status: Former Smoker    Quit date:  11/23/1984  . Smokeless tobacco: Never Used  . Alcohol use 6.0 oz/week    10 Glasses of wine per week    Review of Systems Per HPI unless specifically indicated above     Objective:    BP (!) 144/80   Pulse 79   Temp 98.1 F (36.7 C) (Oral)   Resp 16   Ht 5\' 4"  (1.626 m)   Wt 226 lb 9.6 oz (102.8 kg)   BMI 38.90 kg/m   Wt Readings from Last 3 Encounters:  05/04/16 226 lb 9.6 oz (102.8 kg)  04/11/16 224 lb (101.6 kg)  03/28/16 226 lb (102.5 kg)    Physical Exam  Constitutional: She is oriented to person, place, and time. She appears well-developed and well-nourished. No distress.  Well-appearing, comfortable, cooperative, obese  HENT:  Head: Normocephalic and atraumatic.  Mouth/Throat: Oropharynx is clear and moist.  Nares patent without purulence or edema. Oropharynx clear without erythema, exudates, edema or asymmetry.  Mallampati Score 3 - Visualization of only base of uvula  Eyes: Conjunctivae are normal.  Neck: Normal range of motion. Neck supple. No thyromegaly present.  Neck circumference 16.5 inches  Cardiovascular: Normal rate, regular rhythm, normal heart sounds and intact distal pulses.   No murmur heard. Pulmonary/Chest: Effort normal and breath sounds normal. No respiratory distress. She has no wheezes. She has no rales.  Musculoskeletal: She exhibits no edema.  Lymphadenopathy:    She has no cervical adenopathy.  Neurological: She is alert and oriented to person, place, and time.  Skin: Skin is warm and dry. No rash noted. She is not diaphoretic. No erythema.  Psychiatric: Her behavior is normal.  Well groomed, good eye contact, normal speech and thoughts. Good insight into condition. Mood is slightly labile with some tearful episode and some depressed mood and congruent affect, but does remain positive.  Nursing note and vitals reviewed.       Assessment & Plan:   Problem List Items Addressed This Visit    Obesity (BMI 35.0-39.9 without  comorbidity)    Worsening weight gain overall >1 year, attributed to stress eating and limited lifestyle factors, sometimes limited by generalized pain with less exercise - Comorbid HTN, only mild abnormal cholesterol in past, normal glucose previosly  Encouraged to work on gradually improving regular exercise as tolerated      Insomnia    Likely secondary and closely related to major depression and fibromyalgia, chronic problem >10 years. Limited relief on various therapy, see other A&P - Never on dedicated hypnotic sleep medication (such as Ambien etc) - Concern with prior history of OSA contributing as well, now off CPAP due to lack of insurance coverage for sleep study  Plan: 1. Treat underlying depression, fibromyalgia - given multiple meds and side effects, currently will taper off Nortriptyline and remain off Zoloft, decision to hold on new therapy today 2. Will add Flexeril 5-10mg  TID PRN prefer QHS use for help sleep for now and control fibromyalgia pain 3. Referral to Dr Nehemiah Settle, sleep medicine specialist for further evaluation and possibly proceed with  sleep study to get CPAP approved, can consider other hypnotic agents, as patient would like this second opinion first and is not interested in trying more new medications today      Relevant Orders   Ambulatory referral to Internal Medicine   Hypertension    Slightly elevated BP today, had been better controlled No complications, likely elevated BP in setting of uncontrolled OSA with prior history now off CPAP  Plan: 1. Continue current med Lisinopril -if need to adjust will do in future follow-up      History of obstructive sleep apnea    Prior dx >10 years ago out of state on sleep study, had CPAP with improvement on therapy, now off for years after move and CPAP non functioning - Last sleep study was home sleep study years ago with inconclusive results by report, do not have results available - Now concern with  increased weight gain, obesity BMI >38, and MDD worsening  Plan: 1. Referral to Dr Nehemiah Settle, sleep medicine specialist for further evaluation and possibly proceed with sleep study to get CPAP approved, can consider other hypnotic agents, as patient would like this second opinion first and is not interested in trying more new medications today      Relevant Orders   Ambulatory referral to Internal Medicine   Fibromyalgia    Gradual worsening chronic problem closely related to major depression and poor sleep with insomnia and likely OSA - Chronic problem >10 years. Limited relief on various therapy, see other A&P - Failed various meds including Gabapentin, TCA (Nortriptyline), SSRI (Lexapro, Zoloft), SNRI (Cymbalta, Venlafaxine) - unsure if other SSRI tried  Plan: 1. Treat underlying depression/insomnia to ultimately improve fibromyalgia - given multiple meds and side effects, currently will taper off Nortriptyline and remain off Zoloft, decision to hold on new therapy today by patient request 2. Will add Flexeril 5-10mg  TID PRN prefer QHS use for help sleep for now and control fibromyalgia pain - ideally if tolerates can take regularly, or may need to switch to less sedating agent such as Baclofen during the day 3. Future discuss other medication options such as Savella specifically for fibro (SNRI), and she is interested in second opinion from possible Psychiatry or specialist with insight into Fibromyalgia      Depression - Primary    Currently worsening, chronic problem with MDD >10 years, closely related to fibromyalgia pain and chronic poor sleep insomnia, and likely OSA from prior diagnosis. - Failed various meds including Gabapentin, TCA (Nortriptyline), SSRI (Lexapro, Zoloft), SNRI (Cymbalta, Venlafaxine) - unsure if other SSRI tried  Plan: 1. Treat underlying depression, fibromyalgia - given multiple meds and side effects, currently will taper off Nortriptyline and remain off  Zoloft, decision to hold on new therapy today 2. Continue Wellbutrin XL 300mg  daily in AM - caution that side effect is insomnia 3. Will add Flexeril 5-10mg  TID PRN prefer QHS use for help sleep for now and control fibromyalgia pain 4. Limited other options for managing depression at this time other than other trial SSRI (fluoxetine, paxil, fluvoxamine, some indications for fibromyalgia as well) 5. Considered referral to Psychiatry, but patient defers this second opinion for now, will start with sleep evaluation, referral to Dr Nehemiah Settle for sleep, may need sleep study if can get approved for likely CPAP, suspect controlling OSA will help major depression as well      Relevant Orders   Ambulatory referral to Internal Medicine      No orders of the defined types  were placed in this encounter.     Follow up plan: Return in about 3 months (around 08/03/2016) for Depression PHQ, Insomnia, Fibromyalgia.  [name], [facility] 05/04/2016, 1:24 PM

## 2016-05-04 NOTE — Assessment & Plan Note (Signed)
Prior dx >10 years ago out of state on sleep study, had CPAP with improvement on therapy, now off for years after move and CPAP non functioning - Last sleep study was home sleep study years ago with inconclusive results by report, do not have results available - Now concern with increased weight gain, obesity BMI >38, and MDD worsening  Plan: 1. Referral to Dr Nehemiah Settle, sleep medicine specialist for further evaluation and possibly proceed with sleep study to get CPAP approved, can consider other hypnotic agents, as patient would like this second opinion first and is not interested in trying more new medications today

## 2016-05-24 ENCOUNTER — Other Ambulatory Visit: Payer: Self-pay

## 2016-05-24 ENCOUNTER — Encounter: Payer: Self-pay | Admitting: Family Medicine

## 2016-05-24 DIAGNOSIS — I10 Essential (primary) hypertension: Secondary | ICD-10-CM

## 2016-05-24 DIAGNOSIS — E785 Hyperlipidemia, unspecified: Secondary | ICD-10-CM

## 2016-05-24 MED ORDER — LISINOPRIL 5 MG PO TABS: 5.0000 mg | ORAL_TABLET | Freq: Every day | ORAL | 3 refills | Status: DC

## 2016-05-24 MED ORDER — ATORVASTATIN CALCIUM 20 MG PO TABS: 20.0000 mg | ORAL_TABLET | Freq: Every day | ORAL | 3 refills | Status: DC

## 2016-05-24 NOTE — Telephone Encounter (Signed)
Pharmacy requesting refill Last ov  05/04/16 Last filled 05/21/15 and 04/27/15 Please review. Thank you. sd

## 2016-05-30 ENCOUNTER — Ambulatory Visit: Payer: Managed Care, Other (non HMO) | Admitting: Family Medicine

## 2016-06-27 ENCOUNTER — Encounter: Payer: Self-pay | Admitting: Emergency Medicine

## 2016-06-27 ENCOUNTER — Ambulatory Visit
Admission: EM | Admit: 2016-06-27 | Discharge: 2016-06-27 | Disposition: A | Discharge status: Home / Self Care | Payer: Managed Care, Other (non HMO) | Attending: Family Medicine | Admitting: Family Medicine

## 2016-06-27 DIAGNOSIS — B9789 Other viral agents as the cause of diseases classified elsewhere: Secondary | ICD-10-CM | POA: Diagnosis not present

## 2016-06-27 DIAGNOSIS — R05 Cough: Secondary | ICD-10-CM

## 2016-06-27 DIAGNOSIS — J069 Acute upper respiratory infection, unspecified: Secondary | ICD-10-CM

## 2016-06-27 DIAGNOSIS — R059 Cough, unspecified: Secondary | ICD-10-CM

## 2016-06-27 NOTE — Discharge Instructions (Signed)
Rest, fluids. 

## 2016-06-27 NOTE — ED Provider Notes (Signed)
[facility]    CSN: [id number] Arrival date & time: 06/27/16  1142     History   Chief Complaint Chief Complaint  Patient presents with  . Cough    HPI Rachel Townsend is a 56 y.o. female.   The history is provided by the patient.  Cough  Associated symptoms: rhinorrhea   Associated symptoms: no wheezing   URI  Presenting symptoms: congestion, cough and rhinorrhea   Severity:  Moderate Onset quality:  Sudden Duration:  2 weeks Timing:  Constant Progression:  Partially resolved Chronicity:  New Relieved by:  Prescription medications (treated about 2 weeks ago with augmentin and prednisone) Associated symptoms: no wheezing   Risk factors: not elderly, no chronic cardiac disease, no chronic kidney disease, no chronic respiratory disease, no diabetes mellitus, no immunosuppression, no recent illness and no recent travel     Past Medical History:  Diagnosis Date  . Allergy   . Fibromyalgia   . Hypertension   . Wrist pain     Patient Active Problem List   Diagnosis Date Noted  . Hyperlipidemia 05/24/2016  . History of obstructive sleep apnea 05/04/2016  . Chronic fatigue 08/17/2015  . Sciatica 05/14/2015  . Obesity (BMI 35.0-39.9 without comorbidity) 03/02/2015  . Periodic health assessment, general screening, adult 11/24/2014  . Fibromyalgia 07/16/2014  . Depression 07/16/2014  . Insomnia 07/16/2014  . Hypertension 07/16/2014    Past Surgical History:  Procedure Laterality Date  . BREAST BIOPSY Left    core- neg  . BREAST BIOPSY Left    stereo- neg  . ECTOPIC PREGNANCY SURGERY    . FOOT SURGERY      OB History    No data available       Home Medications    Prior to Admission medications   Medication Sig Start Date End Date Taking? Authorizing Provider  atorvastatin (LIPITOR) 20 MG tablet Take 1 tablet (20 mg total) by mouth daily. 05/24/16  Yes Karamalegos, Devonne Doughty, DO  b complex vitamins tablet Take 1 tablet by mouth daily.   Yes  [provider]  buPROPion (WELLBUTRIN XL) 300 MG 24 hr tablet TAKE 1 TABLET BY MOUTH  DAILY 04/07/16  Yes Arlis Porta., MD  Calcium-Vitamin D 600-200 MG-UNIT tablet Take 1 tablet by mouth 2 (two) times daily.    Yes [provider]  Cholecalciferol (VITAMIN D-1000 MAX ST) 1000 units tablet Take 1,000 Units by mouth daily.   Yes [provider]  co-enzyme Q-10 50 MG capsule Take 100 mg by mouth daily.   Yes [provider]  etodolac (LODINE XL) 500 MG 24 hr tablet TAKE 1 TABLET BY MOUTH TWO  TIMES DAILY 02/19/16  Yes Arlis Porta., MD  FOLIC ACID PO Take 1 tablet by mouth daily.    Yes [provider]  gabapentin (NEURONTIN) 600 MG tablet Take 1 tablet (600 mg total) by mouth 3 (three) times daily. 08/17/15  Yes Arlis Porta., MD  lisinopril (PRINIVIL,ZESTRIL) 5 MG tablet Take 1 tablet (5 mg total) by mouth daily. 05/24/16  Yes Karamalegos, Devonne Doughty, DO  loratadine (CLARITIN) 10 MG tablet Take 10 mg by mouth daily.    Yes [provider]  NIACIN CR PO Take 1 tablet by mouth daily.    Yes [provider]  Omega-3 Fatty Acids (FISH OIL) 1000 MG CAPS Take 1,200 mg by mouth daily.    Yes [provider]  sertraline (ZOLOFT) 100 MG tablet Take  100 mg by mouth daily.   Yes [provider]  Acetaminophen (TYLENOL ARTHRITIS PAIN PO) Take by mouth.    [provider]  albuterol (PROVENTIL HFA;VENTOLIN HFA) 108 (90 Base) MCG/ACT inhaler Inhale 2 puffs into the lungs every 4 (four) hours as needed for wheezing or shortness of breath. 04/11/16   Frederich Cha, MD    Family History Family History  Problem Relation Age of Onset  . Hypertension Mother   . Diabetes Mother   . Hyperlipidemia Mother   . Cancer Mother   . Breast cancer Mother 60  . Hypertension Father   . Hyperlipidemia Father     Social History Social History  Substance Use Topics  . Smoking status: Former Smoker    Quit date:  11/23/1984  . Smokeless tobacco: Never Used  . Alcohol use 6.0 oz/week    10 Glasses of wine per week     Allergies   Patient has no known allergies.   Review of Systems Review of Systems  HENT: Positive for congestion and rhinorrhea.   Respiratory: Positive for cough. Negative for wheezing.      Physical Exam Triage Vital Signs ED Triage Vitals  Enc Vitals Group     BP 06/27/16 1318 106/64     Pulse Rate 06/27/16 1318 77     Resp 06/27/16 1318 18     Temp 06/27/16 1318 98.5 F (36.9 C)     Temp Source 06/27/16 1318 Oral     SpO2 06/27/16 1318 97 %     Weight 06/27/16 1311 215 lb (97.5 kg)     Height 06/27/16 1311 5\' 4"  (1.626 m)     Head Circumference --      Peak Flow --      Pain Score 06/27/16 1312 3     Pain Loc --      Pain Edu? --      Excl. in [place]? --    No data found.   Updated Vital Signs BP 106/64 (BP Location: Left Arm)   Pulse 77   Temp 98.5 F (36.9 C) (Oral)   Resp 18   Ht 5\' 4"  (1.626 m)   Wt 215 lb (97.5 kg)   SpO2 97%   BMI 36.90 kg/m   Visual Acuity Right Eye Distance:   Left Eye Distance:   Bilateral Distance:    Right Eye Near:   Left Eye Near:    Bilateral Near:     Physical Exam  Constitutional: She appears well-developed and well-nourished. No distress.  HENT:  Head: Normocephalic and atraumatic.  Right Ear: Tympanic membrane, external ear and ear canal normal.  Left Ear: Tympanic membrane, external ear and ear canal normal.  Nose: Rhinorrhea present. No mucosal edema, nose lacerations, sinus tenderness, nasal deformity, septal deviation or nasal septal hematoma. No epistaxis.  No foreign bodies. Right sinus exhibits no maxillary sinus tenderness and no frontal sinus tenderness. Left sinus exhibits no maxillary sinus tenderness and no frontal sinus tenderness.  Mouth/Throat: Uvula is midline, oropharynx is clear and moist and mucous membranes are normal. No oropharyngeal exudate.  Eyes: Conjunctivae and EOM are normal.  Pupils are equal, round, and reactive to light. Right eye exhibits no discharge. Left eye exhibits no discharge. No scleral icterus.  Neck: Normal range of motion. Neck supple. No thyromegaly present.  Cardiovascular: Normal rate, regular rhythm and normal heart sounds.   Pulmonary/Chest: Effort normal and breath sounds normal. No respiratory distress. She has no wheezes. She has no  rales.  Lymphadenopathy:    She has no cervical adenopathy.  Skin: She is not diaphoretic.  Nursing note and vitals reviewed.    UC Treatments / Results  Labs (all labs ordered are listed, but only abnormal results are displayed) Labs Reviewed - No data to display  EKG  EKG Interpretation None       Radiology No results found.  Procedures Procedures (including critical care time)  Medications Ordered in UC Medications - No data to display   Initial Impression / Assessment and Plan / UC Course  I have reviewed the triage vital signs and the nursing notes.  Pertinent labs & imaging results that were available during my care of the patient were reviewed by me and considered in my medical decision making (see chart for details).      Final Clinical Impressions(s) / UC Diagnoses   Final diagnoses:  Cough  Viral URI with cough    New Prescriptions New Prescriptions   No medications on file   1. diagnosis reviewed with patient 2. Recommend supportive treatment with rest, increased fluids  3. Follow-up prn if symptoms worsen or don't improve   Norval Gable, MD 06/27/16 1342

## 2016-06-27 NOTE — ED Triage Notes (Signed)
Patient states she has had a bad cough for 2 weeks, [facility] and prednisone. Patient states cough has gotten no better

## 2016-08-08 ENCOUNTER — Ambulatory Visit (INDEPENDENT_AMBULATORY_CARE_PROVIDER_SITE_OTHER): Payer: Managed Care, Other (non HMO) | Admitting: Family Medicine

## 2016-08-08 ENCOUNTER — Encounter: Payer: Self-pay | Admitting: Family Medicine

## 2016-08-08 VITALS — BP 137/82 | HR 68 | Temp 98.3°F | Resp 16 | Ht 64.0 in | Wt 217.0 lb

## 2016-08-08 DIAGNOSIS — F5105 Insomnia due to other mental disorder: Secondary | ICD-10-CM | POA: Diagnosis not present

## 2016-08-08 DIAGNOSIS — M654 Radial styloid tenosynovitis [de Quervain]: Secondary | ICD-10-CM | POA: Diagnosis not present

## 2016-08-08 DIAGNOSIS — F99 Mental disorder, not otherwise specified: Secondary | ICD-10-CM | POA: Diagnosis not present

## 2016-08-08 DIAGNOSIS — M797 Fibromyalgia: Secondary | ICD-10-CM

## 2016-08-08 DIAGNOSIS — E782 Mixed hyperlipidemia: Secondary | ICD-10-CM | POA: Diagnosis not present

## 2016-08-08 DIAGNOSIS — F331 Major depressive disorder, recurrent, moderate: Secondary | ICD-10-CM

## 2016-08-08 DIAGNOSIS — Z8669 Personal history of other diseases of the nervous system and sense organs: Secondary | ICD-10-CM | POA: Diagnosis not present

## 2016-08-08 MED ORDER — BUPROPION HCL ER (XL) 300 MG PO TB24: 300.0000 mg | ORAL_TABLET | Freq: Every day | ORAL | 3 refills | Status: DC

## 2016-08-08 MED ORDER — CYCLOBENZAPRINE HCL 10 MG PO TABS: 5.0000 mg | ORAL_TABLET | Freq: Three times a day (TID) | ORAL | 1 refills | Status: DC | PRN

## 2016-08-08 MED ORDER — ATORVASTATIN CALCIUM 20 MG PO TABS: 20.0000 mg | ORAL_TABLET | Freq: Every day | ORAL | 3 refills | Status: DC

## 2016-08-08 MED ORDER — SERTRALINE HCL 100 MG PO TABS: 100.0000 mg | ORAL_TABLET | Freq: Every day | ORAL | 3 refills | Status: DC

## 2016-08-08 NOTE — Patient Instructions (Addendum)
Thank you for coming to the clinic today.  1.  Start Cyclobenzapine (Flexeril) 10mg  tablets (muscle relaxant) - start with half (cut) to one whole pill at night for muscle relaxant - may make you sedated or sleepy (be careful driving or working on this) if tolerated you can take half to whole tab 2 to 3 times daily or every 8 hours as needed  - If it works great, let me know, and we can send this to [facility] for mail order if you want to continue.  2. Refilled Sertraline 100mg , Atrovastatin 20mg , Wellbutrin XL - all 90 day supply with refills to [facility]  3.  Changed Gabapentin - (did not adjust the rx today), use existing 600mg  tablets - take ONE AND HALF in evening for 1-2 weeks, see how you do, then if needed can also increase morning dose to ONE AND HALF (900mg  per dose) twice daily, if this is works, let me know and we can send appropriate rx new to [facility]  4. Please check with insurance company to find out cost and coverage of alternative option for Depression / Fibromyalgia -   Savella (SNRI)  5. 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 - This is a type of tendonitis involving the tendons from the thumb into the wrist, it is a very common spot for inflammation and pain, usually caused by repetitive activities (lifting, writing, typing, carrying, worse with heavier objective or more repetitive strain) - Once it is flared up it can continue to persist for days to weeks due to inflammation, and mostly importantly needs rest and time to heal  Continue Etolodac anti-inflammatory  - It is safe to take Tylenol Ext Str 500mg  tabs - take 1 to 2 (max dose 1000mg ) every 6 hours as needed for breakthrough pain, max 24 hour daily dose is 6 to 8 tablets or 4000mg   OR take TWO doses of your Tylenol 650mg  x 2 tabs = 1300mg   - Wrist splint will provide support to the tendons and reduce strain  - Wear a THUMB SPICA SPLINT (to limit thumb and wrist flexion)  - REST is extremely  important, the goal is to AVOID re-injury, try to modify activities - Also may try ice packs if swelling, or topical icy hot muscle rub can also help ease worsening pain temporarily  If you get significant worsening pain, weakness in your hand (not due to pain), numbness, tingling, burning, more constant without activity, then follow-up sooner as we may need to consider stronger anti-inflammatory with prednisone, or referral for injection.  Please schedule a Follow-up Appointment to: Return in about 3 months (around 11/08/2016) for depression (PHQ/GAD7) Right wrist tendonitis, fibromyalgia .  If you have any other questions or concerns, please feel free to call the clinic or send a message through [facility]. You may also schedule an earlier appointment if necessary.  Additionally, you may be receiving a survey about your experience at our clinic within a few days to 1 week by e-mail or mail. We value your feedback.  [name], DO [facility]

## 2016-08-08 NOTE — Assessment & Plan Note (Signed)
Suspected Right DeQuervain's tenosynovitis due to recent repetitive overuse involving APL/EPB tendons with localized symptoms, positive Finkelstien. Less likely wrist fracture or concern with scaphoid given no unprovoked point tenderness and no fall/trauma. Similar on L side in past.  Plan: 1. Continue anti-inflammatory with Etolodac 500mg  BID wc for 2-4 weeks then PRN, was already on NSAID should continue this 2. Increase Tylenol PRN use, per AVS 3. Continue to use R Thumb Spica Wrist Splint for support and avoid repetitive strain, allow tendons to heal. Activity modification and relative rest. Also encourage wrist splints for night avoid hyperflex wrist and carpal tunnel related problem 4. May try topical heat/ice PRN 5. Try new Flexeril, and inc dose Gabapentin 6. Follow-up if not improving sooner vs 3 months - consider may need Orthopedics in future for another steroid injection

## 2016-08-08 NOTE — Progress Notes (Signed)
Subjective:    Patient ID: Rachel Townsend, female    DOB: [birth year], 56 y.o.   MRN: [id number]  Rachel Townsend is a 56 y.o. female presenting on 08/08/2016 for Depression   HPI   Follow-up Insomnia / OSA - Last visit with me 05/04/16, for same problem with chronic insomnia with uncontrolled OSA off CPAP since no longer approved by insurance, treated with referral to Dr Nehemiah Settle [facility] medicine in [place], handout on sleep hygiene, tapered off Nortriptyline see below, see prior notes for background information. - Interval update with visit to establish with Dr Maxwell Caul on 07/14/16, reviewed office note, arranged home sleep study, coming up soon, no other treatments - Today patient reports no new concerns, awaiting home sleep study, pleased that it was able to get approved, now anticipates will have sleep study soon and then determine if can get her CPAP machine approved again. - See meds below, continues Zoloft, Wellbutrin. Off Nortriptyline. Continues Gabapentin. Never started Flexeril - Admits tired, fatigued at times, low energy, snoring, witnessed apnea - Denies any excessive worsening daytime sleepiness  DEPRESSION / FIBROMYALGIA - Last visit with me 05/04/16, for same problems depression and fibromyalgia treated by reducing some meds due to side effects and no longer effective, tapered down on Nortriptyline and Zoloft, and plan to continue Wellbutrin, see prior notes for background information. - Interval update with able to come off Nortriptyline and Zoloft over >4-6weeks, then decided she needed back on Zoloft, started back with 50mg  daily in May 2018, then had titrated up to 100mg  daily, did not resume previous dose up to 150mg  - Today patient reports continues Zoloft 100mg  daily, Welbutrin XL 300mg  daily. Admits mood is improved. - Never started Flexeril, requesting rx today - Did not look into cost coverage of Savella - Continues to take Gabapentin, now reduced dose only 600mg  BID  AM / PM, stopped afternoon dose, not feasible due to her daily schedule - Denies worsening mood, suicidal or homicidal ideation  Right Wrist Pain / DeQuervain's Tenosynovitis / History of Carpal Tunnel Syndrome: - Reports additional complaint today with R wrist pain and associated features of some numbness, tingling at times, and with a "popping" or "snapping" of R wrist. She had similar in her Left wrist in past with diagnosis of tendonitis in past, improved with thumb spica wrist splint and ultimately resolved with R wrist tendon steroid injection at Orthopedics. 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 - Taking Etolodac 500mg  BID NSAID for Fibromyalgia, no significant relief - Taking Tylenol 650mg  x 2 per one dose most days, but PRN - Denies any injury trauma, redness, swelling   Depression screen [facility] 2/9 08/08/2016 05/04/2016 03/28/2016  Decreased Interest 1 1 1   Down, Depressed, Hopeless 1 1 1   PHQ - 2 Score 2 2 2   Altered sleeping 3 3 3   Tired, decreased energy 3 3 3   Change in appetite 2 3 3   Feeling bad or failure about yourself  1 2 0  Trouble concentrating 0 1 0  Moving slowly or fidgety/restless 0 1 0  Suicidal thoughts 0 0 0  PHQ-9 Score 11 15 11   Difficult doing work/chores - Somewhat difficult Not difficult at all    Social History  Substance Use  Topics  . Smoking status: Former Smoker    Quit date: 11/23/1984  . Smokeless tobacco: Never Used  . Alcohol use 6.0 oz/week    10 Glasses of wine per week    Review of Systems Per HPI unless specifically indicated above     Objective:    BP 137/82   Pulse 68   Temp 98.3 F (36.8 C) (Oral)   Resp 16   Ht 5\' 4"  (1.626 m)   Wt 217 lb (98.4 kg)   BMI 37.25 kg/m   Wt Readings  from Last 3 Encounters:  08/08/16 217 lb (98.4 kg)  06/27/16 215 lb (97.5 kg)  05/04/16 226 lb 9.6 oz (102.8 kg)    Physical Exam  Constitutional: She is oriented to person, place, and time. She appears well-developed and well-nourished. No distress.  Well-appearing, comfortable, cooperative, obese  HENT:  Head: Normocephalic and atraumatic.  Mouth/Throat: Oropharynx is clear and moist.  Eyes: Conjunctivae are normal.  Neck: Normal range of motion. Neck supple. No thyromegaly present.  Cardiovascular: Normal rate, regular rhythm, normal heart sounds and intact distal pulses.   No murmur heard. Pulmonary/Chest: Effort normal.  Musculoskeletal: She exhibits no edema.  Right Hand/Wrist Inspection: Normal appearance, symmetrical, no bulky MCP joints, no edema or erythema. Palpation: Non tender hand / wrist, carpal bones, including MCP, base of thumb. No distinct anatomical snuff box or scaphoid tenderness. Moderate tenderness over APL / EPB tendons radially. ROM: full active wrist ROM flex / ext, ulnar / radial deviation, pain with radial deviation Special Testing: Finkelstein's test positive with significant pain. Mild positive Tinel's median nerve with reproduced paresthesia. Strength: 5/5 grip, thumb opposition, wrist flex/ext Neurovascular: distally intact  Lymphadenopathy:    She has no cervical adenopathy.  Neurological: She is alert and oriented to person, place, and time.  Skin: Skin is warm and dry. No rash noted. She is not diaphoretic. No erythema.  Psychiatric: Her behavior is normal.  Well groomed, good eye contact, normal speech and thoughts. Good insight into condition. Mood is improved, optimistic. Does not appear anxious. No crying spells or mood lability.  Nursing note and vitals reviewed.       Assessment & Plan:   Problem List Items Addressed This Visit    Insomnia    Likely secondary to uncontrolled OSA (off CPAP due to lack of ins coverage) and MDD with  fibromyalgia, constellation of related problems - Now established with Dr Maxwell Caul since 07/2016, Sleep Medicine, awaiting in home sleep study. No other sleep problems identified at that time  Plan: 1. Treat underlying depression, fibromyalgia - added Flexeril PRN, continue Sertraline, Wellbutrin 2. Anticipate upcoming home sleep study per Dr Maxwell Caul, awaiting results, anticipate will need new CPAP machine orders      Relevant Medications   sertraline (ZOLOFT) 100 MG tablet   Hyperlipidemia    Stable previous lipids only mild elevated LDL Refilled Atorvastatin 20mg  daily      Relevant Medications   atorvastatin (LIPITOR) 20 MG tablet   History of obstructive sleep apnea    Underlying cause for insomnia and other symptoms likely contributing. Uncontrolled OSA off CPAP Proceed with home sleep study per specialist Dr Maxwell Caul, awaiting next step and possible new CPAP      Fibromyalgia    Stable chronic problem, related to MDD / insomnia - Failed various meds including Gabapentin, TCA (Nortriptyline), SSRI, SNRI (Cymbalta, Venlafaxine)  Plan: 1. Treat underlying depression/insomnia to ultimately improve fibromyalgia - Refilled Sertraline 100mg  daily, continue Wellbutrin XL  300mg  daily 2. Start Flexeril 5-10mg  TID PRN - may just need nightly - PRN rx to local pharmacy first then if finds stabilizing regimen for sleep/fibro may send new rx to [facility] for regular dosing 3. Continue Gabapentin - inc dose from 600mg  BID to 900mg  BID, gradually start with 1.5 tabs evening for 1-2 weeks then inc AM, request new rx when ready 4. Again reviewed limited other SSRI/SNRI options - consider Savella for fibro as well, she is to check ins coverage, but now seems improved on Sertraline 5. Anticipate Depression will improve with more controlled sleep and fibro, awaiting home sleep study and hopefully CPAP 6. Future may need Psychiatry additional evaluation, recommend therapist at some point as well 7.  Follow-up 3 months      Relevant Medications   cyclobenzaprine (FLEXERIL) 10 MG tablet   Depression - Primary    Mildly improved chronic MDD, closely related to fibromyalgia pain and chronic poor sleep insomnia with OSA - Failed various meds including TCA (Nortriptyline), SSRI (Lexapro, various doses of Zoloft), SNRI (Cymbalta, Venlafaxine) - Not established with Psychiatry / Therapist  Plan: 1. Refilled Sertraline 100mg  daily, continue Wellbutrin XL 300mg  daily 2. Start Flexeril 5-10mg  TID PRN - may just need nightly - PRN rx to local pharmacy first then if finds stabilizing regimen for sleep/fibro may send new rx to [facility] for regular dosing 3. Again reviewed limited other SSRI/SNRI options - consider Savella for fibro as well, she is to check ins coverage, but now seems improved on Sertraline 4. Anticipate Depression will improve with more controlled sleep and fibro, awaiting home sleep study and hopefully CPAP 5. Future may need Psychiatry additional evaluation, recommend therapist at some point as well 6. Follow-up 3 months      Relevant Medications   buPROPion (WELLBUTRIN XL) 300 MG 24 hr tablet   sertraline (ZOLOFT) 100 MG tablet   De Quervain's tenosynovitis, right    Suspected Right DeQuervain's tenosynovitis due to recent repetitive overuse involving APL/EPB tendons with localized symptoms, positive Finkelstien. Less likely wrist fracture or concern with scaphoid given no unprovoked point tenderness and no fall/trauma. Similar on L side in past.  Plan: 1. Continue anti-inflammatory with Etolodac 500mg  BID wc for 2-4 weeks then PRN, was already on NSAID should continue this 2. Increase Tylenol PRN use, per AVS 3. Continue to use R Thumb Spica Wrist Splint for support and avoid repetitive strain, allow tendons to heal. Activity modification and relative rest. Also encourage wrist splints for night avoid hyperflex wrist and carpal tunnel related problem 4. May try topical heat/ice  PRN 5. Try new Flexeril, and inc dose Gabapentin 6. Follow-up if not improving sooner vs 3 months - consider may need Orthopedics in future for another steroid injection         Meds ordered this encounter  Medications  . cyclobenzaprine (FLEXERIL) 10 MG tablet    Sig: Take 0.5-1 tablets (5-10 mg total) by mouth 3 (three) times daily as needed for muscle spasms. Prefer to start only at night-time.    Dispense:  30 tablet    Refill:  1  . buPROPion (WELLBUTRIN XL) 300 MG 24 hr tablet    Sig: Take 1 tablet (300 mg total) by mouth daily.    Dispense:  90 tablet    Refill:  3  . atorvastatin (LIPITOR) 20 MG tablet    Sig: Take 1 tablet (20 mg total) by mouth daily.    Dispense:  90 tablet    Refill:  3  . sertraline (ZOLOFT) 100 MG tablet    Sig: Take 1 tablet (100 mg total) by mouth daily.    Dispense:  90 tablet    Refill:  3    Follow up plan: Return in about 3 months (around 11/08/2016) for depression (PHQ/GAD7) Right wrist tendonitis, fibromyalgia .  [name], [facility] 08/08/2016, 11:12 PM

## 2016-08-08 NOTE — Assessment & Plan Note (Signed)
Stable previous lipids only mild elevated LDL Refilled Atorvastatin 20mg  daily

## 2016-08-08 NOTE — Assessment & Plan Note (Signed)
Underlying cause for insomnia and other symptoms likely contributing. Uncontrolled OSA off CPAP Proceed with home sleep study per specialist Dr Maxwell Caul, awaiting next step and possible new CPAP

## 2016-08-08 NOTE — Assessment & Plan Note (Signed)
Likely secondary to uncontrolled OSA (off CPAP due to lack of ins coverage) and MDD with fibromyalgia, constellation of related problems - Now established with Dr Maxwell Caul since 07/2016, Sleep Medicine, awaiting in home sleep study. No other sleep problems identified at that time  Plan: 1. Treat underlying depression, fibromyalgia - added Flexeril PRN, continue Sertraline, Wellbutrin 2. Anticipate upcoming home sleep study per Dr Maxwell Caul, awaiting results, anticipate will need new CPAP machine orders

## 2016-08-08 NOTE — Assessment & Plan Note (Signed)
Mildly improved chronic MDD, closely related to fibromyalgia pain and chronic poor sleep insomnia with OSA - Failed various meds including TCA (Nortriptyline), SSRI (Lexapro, various doses of Zoloft), SNRI (Cymbalta, Venlafaxine) - Not established with Psychiatry / Therapist  Plan: 1. Refilled Sertraline 100mg  daily, continue Wellbutrin XL 300mg  daily 2. Start Flexeril 5-10mg  TID PRN - may just need nightly - PRN rx to local pharmacy first then if finds stabilizing regimen for sleep/fibro may send new rx to [facility] for regular dosing 3. Again reviewed limited other SSRI/SNRI options - consider Savella for fibro as well, she is to check ins coverage, but now seems improved on Sertraline 4. Anticipate Depression will improve with more controlled sleep and fibro, awaiting home sleep study and hopefully CPAP 5. Future may need Psychiatry additional evaluation, recommend therapist at some point as well 6. Follow-up 3 months

## 2016-08-08 NOTE — Assessment & Plan Note (Addendum)
Stable chronic problem, related to MDD / insomnia - Failed various meds including Gabapentin, TCA (Nortriptyline), SSRI, SNRI (Cymbalta, Venlafaxine)  Plan: 1. Treat underlying depression/insomnia to ultimately improve fibromyalgia - Refilled Sertraline 100mg  daily, continue Wellbutrin XL 300mg  daily 2. Start Flexeril 5-10mg  TID PRN - may just need nightly - PRN rx to local pharmacy first then if finds stabilizing regimen for sleep/fibro may send new rx to [facility] for regular dosing 3. Continue Gabapentin - inc dose from 600mg  BID to 900mg  BID, gradually start with 1.5 tabs evening for 1-2 weeks then inc AM, request new rx when ready 4. Again reviewed limited other SSRI/SNRI options - consider Savella for fibro as well, she is to check ins coverage, but now seems improved on Sertraline 5. Anticipate Depression will improve with more controlled sleep and fibro, awaiting home sleep study and hopefully CPAP 6. Future may need Psychiatry additional evaluation, recommend therapist at some point as well 7. Follow-up 3 months

## 2016-08-10 ENCOUNTER — Ambulatory Visit: Payer: Managed Care, Other (non HMO) | Admitting: Family Medicine

## 2016-09-19 LAB — HEMOGLOBIN A1C: Hemoglobin A1C: 5.4

## 2016-09-19 LAB — LIPID PANEL
Cholesterol: 169 (ref 0–200)
HDL: 56 (ref 35–70)
LDL Cholesterol: 98
Triglycerides: 75 (ref 40–160)

## 2016-09-19 LAB — BASIC METABOLIC PANEL
Creatinine: 1 (ref <=1.1)
GLUCOSE: 96

## 2016-09-28 ENCOUNTER — Other Ambulatory Visit: Payer: Self-pay | Admitting: Family Medicine

## 2016-09-28 ENCOUNTER — Encounter: Payer: Self-pay | Admitting: Family Medicine

## 2016-09-28 ENCOUNTER — Ambulatory Visit (INDEPENDENT_AMBULATORY_CARE_PROVIDER_SITE_OTHER): Payer: Managed Care, Other (non HMO) | Admitting: Family Medicine

## 2016-09-28 VITALS — BP 120/65 | HR 63 | Temp 98.0°F | Resp 16 | Ht 64.0 in | Wt 208.6 lb

## 2016-09-28 DIAGNOSIS — E782 Mixed hyperlipidemia: Secondary | ICD-10-CM

## 2016-09-28 DIAGNOSIS — E669 Obesity, unspecified: Secondary | ICD-10-CM

## 2016-09-28 DIAGNOSIS — Z9989 Dependence on other enabling machines and devices: Secondary | ICD-10-CM | POA: Diagnosis not present

## 2016-09-28 DIAGNOSIS — Z0000 Encounter for general adult medical examination without abnormal findings: Secondary | ICD-10-CM

## 2016-09-28 DIAGNOSIS — I10 Essential (primary) hypertension: Secondary | ICD-10-CM

## 2016-09-28 DIAGNOSIS — R7309 Other abnormal glucose: Secondary | ICD-10-CM

## 2016-09-28 DIAGNOSIS — G4733 Obstructive sleep apnea (adult) (pediatric): Secondary | ICD-10-CM

## 2016-09-28 DIAGNOSIS — M654 Radial styloid tenosynovitis [de Quervain]: Secondary | ICD-10-CM

## 2016-09-28 DIAGNOSIS — M797 Fibromyalgia: Secondary | ICD-10-CM

## 2016-09-28 DIAGNOSIS — R5382 Chronic fatigue, unspecified: Secondary | ICD-10-CM

## 2016-09-28 DIAGNOSIS — Z1159 Encounter for screening for other viral diseases: Secondary | ICD-10-CM

## 2016-09-28 MED ORDER — GABAPENTIN 600 MG PO TABS: 900.0000 mg | ORAL_TABLET | Freq: Two times a day (BID) | ORAL | 3 refills | Status: DC

## 2016-09-28 NOTE — Assessment & Plan Note (Signed)
Dramatically improved on CPAP Still with wrist pain, but now normal range with 1 week of CPAP Continue current therapy

## 2016-09-28 NOTE — Assessment & Plan Note (Addendum)
Recurrent R DeQuervain's tenosynovitis, with recent setback re-injury prior repetitive overuse, no trauma or other impact injury - Had improved with conservative therapy in past  Plan: 1. Given duration and now recurrence, prefer to refer patient to Orthopedics - sent to [facility] for further evaluation and management, requesting R wrist steroid injection. Considered this in office but did not feel comfortable injecting this area without US guidance today, continue other therapy as previously planned

## 2016-09-28 NOTE — Progress Notes (Signed)
Subjective:    Patient ID: Quinnley Colasurdo, female    DOB: Jan 07, 1961, 56 y.o.   MRN: [id number]  Joleigh Mineau is a 56 y.o. female presenting on 09/28/2016 for Wrist Pain (Right side onset 2 month )  Patient presents for a same day appointment.  HPI   Right Wrist Pain / DeQuervain's Tenosynovitis / History of Carpal Tunnel Syndrome: - Last visit with me 08/08/16, for initial visit for same problem, treated with NSAID Etolodac, increased Tylenol, R Thumb Spica Splint, activity modification, Flexeril and inc Gabapentin, see prior notes for background information. - Interval update with notable improvement after about 1 month, was starting to wear wrist splint less and doing well, until had set back earlier this week while reaching out and hurt wrist again. - Today patient reports pain seems similar to when initial injury, with continued symptoms again. No significant change from last visit despite interval improvement. She is asking about steroid injection into wrist, and if she needs to see Orthopedics. - Denies any injury trauma, redness, swelling  Follow-up Insomnia / OSA - Last visit with me 08/08/16, for same problem insomnia/OSA, now followed by Dr Nehemiah Settle [facility]) sleep specialist and she had new PSG arranged and now has new CPAP machine that was approved, see prior notes for background information. - Interval update with dramatic improvement on CPAP with improved sleep, more energy, less pain and some weight loss - Today patient reports she got CPAP machine 1 week ago, uses durable medical supplies from [employer] - She has data to show today that she is wearing machine regularly, states wears it every night, tolerating it well and benefiting from therapy, able to rest more - Denies any excessive worsening daytime sleepiness, apneas, fatigue  Social History  Substance Use Topics  . Smoking status: Former Smoker    Quit date: 11/23/1984  . Smokeless tobacco: Never Used  .  Alcohol use 6.0 oz/week    10 Glasses of wine per week    Review of Systems Per HPI unless specifically indicated above     Objective:    BP 120/65   Pulse 63   Temp 98 F (36.7 C) (Oral)   Resp 16   Ht 5\' 4"  (1.626 m)   Wt 208 lb 9.6 oz (94.6 kg)   BMI 35.81 kg/m   Wt Readings from Last 3 Encounters:  09/28/16 208 lb 9.6 oz (94.6 kg)  08/08/16 217 lb (98.4 kg)  06/27/16 215 lb (97.5 kg)    Physical Exam  Constitutional: She is oriented to person, place, and time. She appears well-developed and well-nourished. No distress.  Well-appearing, comfortable, cooperative, obese but appears to have had some appropriate weight loss  HENT:  Head: Normocephalic and atraumatic.  Mouth/Throat: Oropharynx is clear and moist.  Eyes: Conjunctivae are normal.  Neck: Normal range of motion.  Cardiovascular: Normal rate, regular rhythm, normal heart sounds and intact distal pulses.   No murmur heard. Pulmonary/Chest: Effort normal.  Musculoskeletal: She exhibits no edema.  Right Hand/Wrist - did not fully re-examine wrist, localized tenderness in same area of R APL/ EPB tendons - Mild interval improvement less tender today - has wrist splint on and removed for exam  - See exam below from last visit Inspection: Normal appearance, symmetrical, no bulky MCP joints, no edema or erythema. Palpation: Non tender hand / wrist, carpal bones, including MCP, base of thumb. No distinct anatomical snuff box or scaphoid tenderness. Moderate tenderness over APL / EPB tendons radially. ROM:  full active wrist ROM flex / ext, ulnar / radial deviation, pain with radial deviation Special Testing: Finkelstein's test positive with significant pain. Mild positive Tinel's median nerve with reproduced paresthesia. Strength: 5/5 grip, thumb opposition, wrist flex/ext Neurovascular: distally intact  Neurological: She is alert and oriented to person, place, and time.  Skin: Skin is warm and dry. No rash noted. She  is not diaphoretic. No erythema.  Psychiatric: Her behavior is normal.  Well groomed, good eye contact, normal speech and thoughts. Good insight into condition. Mood is improved, optimistic. Does not appear anxious. No crying spells or mood lability.  Nursing note and vitals reviewed.    Results for orders placed or performed in visit on 81/27/51  Basic metabolic panel  Result Value Ref Range   Glucose 96    Creatinine 1.0 0.5 - 1.1  Lipid panel  Result Value Ref Range   Triglycerides 75 40 - 160   Cholesterol 169 0 - 200   HDL 56 35 - 70   LDL Cholesterol 98   Hemoglobin A1c  Result Value Ref Range   Hemoglobin A1C 5.4       Assessment & Plan:   Problem List Items Addressed This Visit    OSA on CPAP    Well controlled, chronic OSA now back on new CPAP x 1 week - Good adherence to CPAP nightly, has data to show usage - Continue current CPAP therapy, patient seems to be benefiting from therapy (improved BP, wt loss, inc energy, better sleep) - She is to follow-up with Dr Maxwell Caul as needed for review of CPAP      Obesity (BMI 35.0-39.9 without comorbidity)   Hypertension    Dramatically improved on CPAP Still with wrist pain, but now normal range with 1 week of CPAP Continue current therapy      Fibromyalgia   Relevant Medications   gabapentin (NEURONTIN) 600 MG tablet   De Quervain's tenosynovitis, right - Primary    Recurrent R DeQuervain's tenosynovitis, with recent setback re-injury prior repetitive overuse, no trauma or other impact injury - Had improved with conservative therapy in past  Plan: 1. Given duration and now recurrence, prefer to refer patient to Orthopedics - sent to [facility] for further evaluation and management, requesting R wrist steroid injection. Considered this in office but did not feel comfortable injecting this area without US guidance today, continue other therapy as previously planned      Relevant Orders   Ambulatory referral to  Orthopedic Surgery      Meds ordered this encounter  Medications  . gabapentin (NEURONTIN) 600 MG tablet    Sig: Take 1.5 tablets (900 mg total) by mouth 2 (two) times daily.    Dispense:  270 tablet    Refill:  3    Follow up plan: Return in about 8 weeks (around 11/21/2016) for Annual Physical.  [name], DO [facility] 09/28/2016, 11:39 PM

## 2016-09-28 NOTE — Assessment & Plan Note (Signed)
Well controlled, chronic OSA now back on new CPAP x 1 week - Good adherence to CPAP nightly, has data to show usage - Continue current CPAP therapy, patient seems to be benefiting from therapy (improved BP, wt loss, inc energy, better sleep) - She is to follow-up with Dr Maxwell Caul as needed for review of CPAP

## 2016-09-28 NOTE — Patient Instructions (Addendum)
Thank you for coming to the clinic today.  1. As discussed you likely need the R wrist injection, without ultrasound I am not as comfortable performing this here, we can re-consider if need be.  Referral to [facility], they will contact you. If you don't hear back then feel free to call them later this week or next, let them know you would like the wrist injection and they should schedule you with the right person  [facility] Address: [address], [place], [place] [address] Hours:  9AM-5PM Phone: [phone]  Congratulations on the CPAP progress, weight loss, and lifestyle changes. I'm very impressed!   DUE for FASTING BLOOD WORK (no food or drink after midnight before the lab appointment, only water or coffee without cream/sugar on the morning of)  SCHEDULE "Lab Only" visit in the morning at the clinic for lab draw in 2 MONTHS   - Make sure Lab Only appointment is at about 1 week before your next appointment, so that results will be available  For Lab Results, once available within 2-3 days of blood draw, you can can log in to MyChart online to view your results and a brief explanation. Also, we can discuss results at next follow-up visit.   Please schedule a Follow-up Appointment to: Return in about 8 weeks (around 11/21/2016) for Annual Physical.  If you have any other questions or concerns, please feel free to call the clinic or send a message through [place]. You may also schedule an earlier appointment if necessary.  Additionally, you may be receiving a survey about your experience at our clinic within a few days to 1 week by e-mail or mail. We value your feedback.  [name], DO [facility]

## 2016-10-10 ENCOUNTER — Other Ambulatory Visit: Payer: Self-pay | Admitting: Family Medicine

## 2016-10-10 DIAGNOSIS — M797 Fibromyalgia: Secondary | ICD-10-CM

## 2016-11-14 ENCOUNTER — Telehealth: Payer: Self-pay | Admitting: Family Medicine

## 2016-11-14 ENCOUNTER — Other Ambulatory Visit: Payer: Self-pay

## 2016-11-14 DIAGNOSIS — R5382 Chronic fatigue, unspecified: Secondary | ICD-10-CM

## 2016-11-14 DIAGNOSIS — I10 Essential (primary) hypertension: Secondary | ICD-10-CM

## 2016-11-14 DIAGNOSIS — Z0000 Encounter for general adult medical examination without abnormal findings: Secondary | ICD-10-CM

## 2016-11-14 DIAGNOSIS — Z1159 Encounter for screening for other viral diseases: Secondary | ICD-10-CM

## 2016-11-14 NOTE — Telephone Encounter (Signed)
Order is ready for patient.

## 2016-11-14 NOTE — Telephone Encounter (Signed)
Pt wants to pick up lab order this morning to take to [facility].

## 2016-11-15 LAB — CBC WITH DIFFERENTIAL/PLATELET
BASOS ABS: 0 10*3/uL (ref 0.0–0.2)
Basos: 1 %
EOS (ABSOLUTE): 0.3 10*3/uL (ref 0.0–0.4)
Eos: 6 %
HEMOGLOBIN: 13.6 g/dL (ref 11.1–15.9)
Hematocrit: 39.8 % (ref 34.0–46.6)
Immature Grans (Abs): 0 10*3/uL (ref 0.0–0.1)
Immature Granulocytes: 0 %
LYMPHS ABS: 1.7 10*3/uL (ref 0.7–3.1)
Lymphs: 30 %
MCH: 29.8 pg (ref 26.6–33.0)
MCHC: 34.2 g/dL (ref 31.5–35.7)
MCV: 87 fL (ref 79–97)
MONOS ABS: 0.5 10*3/uL (ref 0.1–0.9)
Monocytes: 8 %
NEUTROS ABS: 3.1 10*3/uL (ref 1.4–7.0)
Neutrophils: 55 %
PLATELETS: 293 10*3/uL (ref 150–379)
RBC: 4.56 x10E6/uL (ref 3.77–5.28)
RDW: 14.3 % (ref 12.3–15.4)
WBC: 5.5 10*3/uL (ref 3.4–10.8)

## 2016-11-15 LAB — COMPREHENSIVE METABOLIC PANEL
ALT: 27 IU/L (ref 0–32)
AST: 30 IU/L (ref 0–40)
Albumin/Globulin Ratio: 2.2 (ref 1.2–2.2)
Albumin: 4.4 g/dL (ref 3.5–5.5)
Alkaline Phosphatase: 81 IU/L (ref 39–117)
BILIRUBIN TOTAL: 0.5 mg/dL (ref 0.0–1.2)
BUN/Creatinine Ratio: 26 — ABNORMAL HIGH (ref 9–23)
BUN: 21 mg/dL (ref 6–24)
CO2: 23 mmol/L (ref 20–29)
Calcium: 9.6 mg/dL (ref 8.7–10.2)
Chloride: 105 mmol/L (ref 96–106)
Creatinine, Ser: 0.82 mg/dL (ref 0.57–1.00)
GFR calc Af Amer: 93 mL/min/{1.73_m2} (ref >59)
GFR calc non Af Amer: 80 mL/min/{1.73_m2} (ref >59)
GLUCOSE: 94 mg/dL (ref 65–99)
Globulin, Total: 2 g/dL (ref 1.5–4.5)
POTASSIUM: 4.3 mmol/L (ref 3.5–5.2)
SODIUM: 142 mmol/L (ref 134–144)
Total Protein: 6.4 g/dL (ref 6.0–8.5)

## 2016-11-15 LAB — HEPATITIS C ANTIBODY

## 2016-11-21 ENCOUNTER — Ambulatory Visit: Payer: Managed Care, Other (non HMO) | Admitting: Family Medicine

## 2016-11-21 ENCOUNTER — Ambulatory Visit (INDEPENDENT_AMBULATORY_CARE_PROVIDER_SITE_OTHER): Payer: Managed Care, Other (non HMO) | Admitting: Family Medicine

## 2016-11-21 ENCOUNTER — Encounter: Payer: Self-pay | Admitting: Family Medicine

## 2016-11-21 VITALS — BP 107/70 | HR 84 | Temp 98.0°F | Resp 16 | Ht 64.0 in | Wt 202.8 lb

## 2016-11-21 DIAGNOSIS — E669 Obesity, unspecified: Secondary | ICD-10-CM

## 2016-11-21 DIAGNOSIS — M797 Fibromyalgia: Secondary | ICD-10-CM | POA: Diagnosis not present

## 2016-11-21 DIAGNOSIS — Z9989 Dependence on other enabling machines and devices: Secondary | ICD-10-CM | POA: Diagnosis not present

## 2016-11-21 DIAGNOSIS — G4733 Obstructive sleep apnea (adult) (pediatric): Secondary | ICD-10-CM

## 2016-11-21 DIAGNOSIS — Z23 Encounter for immunization: Secondary | ICD-10-CM | POA: Diagnosis not present

## 2016-11-21 DIAGNOSIS — R252 Cramp and spasm: Secondary | ICD-10-CM

## 2016-11-21 DIAGNOSIS — F331 Major depressive disorder, recurrent, moderate: Secondary | ICD-10-CM

## 2016-11-21 DIAGNOSIS — Z0000 Encounter for general adult medical examination without abnormal findings: Secondary | ICD-10-CM

## 2016-11-21 DIAGNOSIS — I10 Essential (primary) hypertension: Secondary | ICD-10-CM

## 2016-11-21 DIAGNOSIS — E66811 Obesity, class 1: Secondary | ICD-10-CM

## 2016-11-21 MED ORDER — CYCLOBENZAPRINE HCL 10 MG PO TABS: 10.0000 mg | ORAL_TABLET | Freq: Every day | ORAL | 1 refills | Status: DC
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
## 2016-11-21 NOTE — Assessment & Plan Note (Signed)
Stable chronic problem, related to MDD / insomnia - Failed various meds including Gabapentin, TCA (Nortriptyline), SSRI, SNRI (Cymbalta, Venlafaxine)  Plan: 1. Mildly improved with better sleep now insomnia/OSA controlled on CPAP 2. Continue current med regimen, Sertraline, Wellbutrin, Gabapentin, and agree refill Flexeril mainly night-time use 10mg  also consider PRN daytime dose 3. Continue Gabapentin - 600-900 BID 4. Future follow-up 6 months - consider other SSRI/SNRI options - consider Savella for fibro as well
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
## 2016-11-21 NOTE — Assessment & Plan Note (Signed)
Well-controlled HTN, on minimal medication - Home BP readings not checking  No known complications    Plan:  1. Continue current BP regimen - Lisinopril 5mg daily 2. Encourage improved lifestyle - low sodium diet, regular exercise 3. May consider monitor BP outside office, bring readings to next visit, if persistently >140/90 or new symptoms notify office sooner 4. Follow-up q 6 mo 

## 2016-11-21 NOTE — Assessment & Plan Note (Addendum)
BMI >34, concern with comorbid conditions with HTN, HLD, and OSA - On CPAP - Weight loss with lifestyle diet change, now continues to improve  Plan: 1. Encourage continue reduce carb / portion size, improve diet, and improve regular exercise walking 2. Continue CPAP for OSA 3. Follow-up 6 months weight check

## 2016-11-21 NOTE — Assessment & Plan Note (Signed)
Significantly improved chronic MDD now on CPAP for OSA with improved sleep. - Complicated by fibromyalgia, insomnia - Failed various meds including TCA (Nortriptyline), SSRI (Lexapro, various doses of Zoloft), SNRI (Cymbalta, Venlafaxine) - Not established with Psychiatry / Therapist  Plan: 1. Continue current Sertraline 100mg  daily, continue Wellbutrin XL 300mg  daily - Discussed dose adjust and may reduce Wellbutrin in future due to concern insomnia factor and if mood is now controlled with better sleep 2. Consider other SSRI/SNRI options if need 3. Follow-up 6 months PHQ - may consider future psych if need

## 2016-11-21 NOTE — Patient Instructions (Addendum)
Thank you for coming to the clinic today.  1.  Keep up the great work!  2. In future, since mood is very well controlled now with sleep on CPAP machine. - Recommend we may consider reducing Wellbutrin XL 300mg  down to 150mg  in future.  3. Sent Flexeril 10mg  to [facility] for 90 day supply take 1 nightly for now, may adjust dose in future  Please schedule a Follow-up Appointment to: Return in about 6 months (around 05/22/2017) for OSA/Insomnia, Mood PHQ, Fibro.  If you have any other questions or concerns, please feel free to call the clinic or send a message through [place]. You may also schedule an earlier appointment if necessary.  Additionally, you may be receiving a survey about your experience at our clinic within a few days to 1 week by e-mail or mail. We value your feedback.  [name], DO [place]

## 2017-03-21 ENCOUNTER — Other Ambulatory Visit: Payer: Self-pay | Admitting: Family Medicine

## 2017-03-21 DIAGNOSIS — M1990 Unspecified osteoarthritis, unspecified site: Secondary | ICD-10-CM

## 2017-03-21 MED ORDER — ETODOLAC ER 500 MG PO TB24: 500.0000 mg | ORAL_TABLET | Freq: Two times a day (BID) | ORAL | 3 refills | Status: DC

## 2017-05-02 ENCOUNTER — Other Ambulatory Visit: Payer: Self-pay | Admitting: Family Medicine

## 2017-05-02 DIAGNOSIS — I10 Essential (primary) hypertension: Secondary | ICD-10-CM

## 2017-05-02 DIAGNOSIS — E782 Mixed hyperlipidemia: Secondary | ICD-10-CM

## 2017-05-04 ENCOUNTER — Other Ambulatory Visit: Payer: Self-pay | Admitting: Family Medicine

## 2017-05-04 DIAGNOSIS — M797 Fibromyalgia: Secondary | ICD-10-CM

## 2017-05-04 DIAGNOSIS — R252 Cramp and spasm: Secondary | ICD-10-CM

## 2017-05-04 DIAGNOSIS — F331 Major depressive disorder, recurrent, moderate: Secondary | ICD-10-CM

## 2017-05-05 ENCOUNTER — Other Ambulatory Visit: Payer: Self-pay

## 2017-05-05 DIAGNOSIS — M1990 Unspecified osteoarthritis, unspecified site: Secondary | ICD-10-CM

## 2017-05-05 MED ORDER — ETODOLAC ER 500 MG PO TB24: 500.0000 mg | ORAL_TABLET | Freq: Two times a day (BID) | ORAL | 3 refills | Status: DC

## 2017-05-22 ENCOUNTER — Ambulatory Visit: Payer: Managed Care, Other (non HMO) | Admitting: Family Medicine

## 2017-05-22 ENCOUNTER — Encounter: Payer: Self-pay | Admitting: Family Medicine

## 2017-05-22 VITALS — BP 121/66 | HR 65 | Temp 98.0°F | Resp 16 | Ht 64.0 in | Wt 216.0 lb

## 2017-05-22 DIAGNOSIS — F5105 Insomnia due to other mental disorder: Secondary | ICD-10-CM | POA: Diagnosis not present

## 2017-05-22 DIAGNOSIS — R5382 Chronic fatigue, unspecified: Secondary | ICD-10-CM

## 2017-05-22 DIAGNOSIS — F331 Major depressive disorder, recurrent, moderate: Secondary | ICD-10-CM

## 2017-05-22 DIAGNOSIS — M797 Fibromyalgia: Secondary | ICD-10-CM

## 2017-05-22 DIAGNOSIS — F99 Mental disorder, not otherwise specified: Secondary | ICD-10-CM

## 2017-05-22 DIAGNOSIS — G4733 Obstructive sleep apnea (adult) (pediatric): Secondary | ICD-10-CM | POA: Diagnosis not present

## 2017-05-22 DIAGNOSIS — Z9989 Dependence on other enabling machines and devices: Secondary | ICD-10-CM

## 2017-05-22 NOTE — Patient Instructions (Addendum)
Thank you for coming to the office today.  Increase Gabapentin at evening dose - instead of 600mg  tablet twice daily - NOW increase to one (600mg ) in AM and ONE AND HALF tablet in PM (for dose of 900) - then after 1-2 weeks or more, if needed you may increase to TWO tablets (600mg  each) in PM for dose of 1200mg  (this is max dose at once)  Let me know in 1-2 weeks if this works and what dose you need for refill.  May continue Flexeril in evening to help sleep and muscle aches as well to help you feel more rested.  Continue Sertraline and Wellbutrin for now.   DUE for FASTING BLOOD WORK (no food or drink after midnight before the lab appointment, only water or coffee without cream/sugar on the morning of)  [facility] 6 MONTHS    Please schedule a Follow-up Appointment to: Return in about 6 months (around 11/21/2017) for Annual Physical.  If you have any other questions or concerns, please feel free to call the office or send a message through [place]. You may also schedule an earlier appointment if necessary.  Additionally, you may be receiving a survey about your experience at our office within a few days to 1 week by e-mail or mail. We value your feedback.  [name], DO [place]

## 2017-05-22 NOTE — Assessment & Plan Note (Signed)
Persistent problem Secondary to fibromyalgia and some issues with sleep, however overall she is improved See A&P

## 2017-05-22 NOTE — Assessment & Plan Note (Signed)
Well controlled, chronic OSA on CPAP - Continue current CPAP therapy, patient seems to be benefiting from therapy (still goal to improve weight) Future if need follow-up with Dr Osborne as needed for review of CPAP 

## 2017-05-22 NOTE — Assessment & Plan Note (Addendum)
Likely secondary to OSA / fibro / depression/anxiety On CPAP, per Dr Maxwell Caul  Plan: Goal to break cycle with improve sleep for better symptoms relief - Continue CPAP - Will increase Gabapentin from 600mg  BID to 600 AM / 900mg  (1.5 tab) PM - may increase to 1200mg  at bedtime if need in future - notify office when ready for higher dose or new rx - Also may take Flexeril more regularly in PM - Continue current med regimen, Sertraline, Wellbutrin - Future follow-up 6 months - consider other SSRI/SNRI options - consider Savella for fibro as well

## 2017-05-22 NOTE — Progress Notes (Signed)
Subjective:    Patient ID: Rachel Townsend, female    DOB: 10-27-1960, 57 y.o.   MRN: [id number]  Rachel Townsend is a 57 y.o. female presenting on 05/22/2017 for Depression   HPI   FOLLOW-UP Insomnia / OSA on CPAP Last visit with me 10/2016, for same problem, continued treat with CPAP for OSA, see prior notes for background information.  - Today patient reports overall her sleep remains improved on CPAP. She has some problems with mood and anxiety and other symptoms contributing to her staying awake at times, but mostly just feels not as rested sometimes.  - She is wearing CPAP nightly, tolerating better, and benefits from therapy - She has gained some weight back and now has less energy - She is taking anti depressants as below, also taking Flexeril PRN, but she is forgetting to take regularly at night, when she does it helps her rest and helps her muscle tension, as she has trouble relaxing  FOLLOW-UP DEPRESSION / FIBROMYALGIA Last visit 10/2016, see note for background - Interval updates: Recent life stressors, she lost pet dog recently, and now rescued new dog (senior dog) less walking and active. Also several other life stressors, she has not had as much motivation, has car trouble concerns and other family and financial stressors. -Today she reports recently she has tried to be more active in yard but not able to tolerate as much due to pain with fibromyalgia and reduced energy. - She admits has limited support system locally here in [place]. She has several sisters, only one sister who she is close to, all family is in [place] (she moved here 5-6 years ago) - Husband is in poor health, with memory as well. - She is taking Gabapentin 600mg  tablet TWICE daily most days, not able to take a third dose in afternoon usually - As mentioned above she takes Flexeril PRN with relief, but forgets to take it often - Regarding depression, continues Sertraline 100mg  and Wellbutrin XL 300mg  daily, she  prefers not to switch meds again since she has tried so many and does not like the adjustment period while new med trial - Still has generalized pain from fibromyalgia, had improved before with better sleep control - Denies suicidal or homicidal ideation, see PHQ GAD   Depression screen [facility] 2/9 05/22/2017 11/21/2016 08/08/2016  Decreased Interest 1 0 1  Down, Depressed, Hopeless 1 0 1  PHQ - 2 Score 2 0 2  Altered sleeping 2 0 3  Tired, decreased energy 2 1 3   Change in appetite 2 1 2   Feeling bad or failure about yourself  0 0 1  Trouble concentrating 0 0 0  Moving slowly or fidgety/restless 0 0 0  Suicidal thoughts 0 0 0  PHQ-9 Score 8 2 11   Difficult doing work/chores Somewhat difficult Not difficult at all -   GAD 7 : Generalized Anxiety Score 05/22/2017  Nervous, Anxious, on Edge 1  Control/stop worrying 1  Worry too much - different things 1  Trouble relaxing 1  Restless 0  Easily annoyed or irritable 0  Afraid - awful might happen 0  Total GAD 7 Score 4  Anxiety Difficulty Not difficult at all    Social History   Tobacco Use  . Smoking status: Former Smoker    Last attempt to quit: 11/23/1984    Years since quitting: 32.5  . Smokeless tobacco: Never Used  Substance Use Topics  . Alcohol use: Yes    Alcohol/week: 6.0 oz  Types: 10 Glasses of wine per week  . Drug use: No    Review of Systems Per HPI unless specifically indicated above     Objective:    BP 121/66   Pulse 65   Temp 98 F (36.7 C) (Oral)   Resp 16   Ht 5\' 4"  (1.626 m)   Wt 216 lb (98 kg)   BMI 37.08 kg/m   Wt Readings from Last 3 Encounters:  05/22/17 216 lb (98 kg)  11/21/16 202 lb 12.8 oz (92 kg)  09/28/16 208 lb 9.6 oz (94.6 kg)    Physical Exam  Constitutional: She is oriented to person, place, and time. She appears well-developed and well-nourished. No distress.  Well-appearing, comfortable, cooperative, obese  HENT:  Head: Normocephalic and atraumatic.  Mouth/Throat:  Oropharynx is clear and moist.  Eyes: Conjunctivae are normal. Right eye exhibits no discharge. Left eye exhibits no discharge.  Neck: Normal range of motion. Neck supple. No thyromegaly present.  Cardiovascular: Normal rate, regular rhythm, normal heart sounds and intact distal pulses.  No murmur heard. Pulmonary/Chest: Effort normal and breath sounds normal. No respiratory distress. She has no wheezes. She has no rales.  Musculoskeletal: Normal range of motion. She exhibits no edema.  Lymphadenopathy:    She has no cervical adenopathy.  Neurological: She is alert and oriented to person, place, and time.  Skin: Skin is warm and dry. No rash noted. She is not diaphoretic. No erythema.  Psychiatric: She has a normal mood and affect. Her behavior is normal.  Well groomed, good eye contact, normal speech and thoughts  Nursing note and vitals reviewed.  Results for orders placed or performed in visit on 11/14/16  Comprehensive metabolic panel  Result Value Ref Range   Glucose 94 65 - 99 mg/dL   BUN 21 6 - 24 mg/dL   Creatinine, Ser 0.82 0.57 - 1.00 mg/dL   GFR calc non Af Amer 80 >59 mL/min/1.73   GFR calc Af Amer 93 >59 mL/min/1.73   BUN/Creatinine Ratio 26 (H) 9 - 23   Sodium 142 134 - 144 mmol/L   Potassium 4.3 3.5 - 5.2 mmol/L   Chloride 105 96 - 106 mmol/L   CO2 23 20 - 29 mmol/L   Calcium 9.6 8.7 - 10.2 mg/dL   Total Protein 6.4 6.0 - 8.5 g/dL   Albumin 4.4 3.5 - 5.5 g/dL   Globulin, Total 2.0 1.5 - 4.5 g/dL   Albumin/Globulin Ratio 2.2 1.2 - 2.2   Bilirubin Total 0.5 0.0 - 1.2 mg/dL   Alkaline Phosphatase 81 39 - 117 IU/L   AST 30 0 - 40 IU/L   ALT 27 0 - 32 IU/L  CBC with Differential/Platelet  Result Value Ref Range   WBC 5.5 3.4 - 10.8 x10E3/uL   RBC 4.56 3.77 - 5.28 x10E6/uL   Hemoglobin 13.6 11.1 - 15.9 g/dL   Hematocrit 39.8 34.0 - 46.6 %   MCV 87 79 - 97 fL   MCH 29.8 26.6 - 33.0 pg   MCHC 34.2 31.5 - 35.7 g/dL   RDW 14.3 12.3 - 15.4 %   Platelets 293 150 -  379 x10E3/uL   Neutrophils 55 Not Estab. %   Lymphs 30 Not Estab. %   Monocytes 8 Not Estab. %   Eos 6 Not Estab. %   Basos 1 Not Estab. %   Neutrophils Absolute 3.1 1.4 - 7.0 x10E3/uL   Lymphocytes Absolute 1.7 0.7 - 3.1 x10E3/uL   Monocytes Absolute 0.5  0.1 - 0.9 x10E3/uL   EOS (ABSOLUTE) 0.3 0.0 - 0.4 x10E3/uL   Basophils Absolute 0.0 0.0 - 0.2 x10E3/uL   Immature Granulocytes 0 Not Estab. %   Immature Grans (Abs) 0.0 0.0 - 0.1 x10E3/uL  Hepatitis C antibody  Result Value Ref Range   Hep C Virus Ab <0.1 0.0 - 0.9 s/co ratio      Assessment & Plan:   Problem List Items Addressed This Visit    Chronic fatigue    Persistent problem Secondary to fibromyalgia and some issues with sleep, however overall she is improved See A&P      Depression - Primary    Recent worsening but overall still improved chronic MDD Complicated by fibromyalgia and insomnia, OSA - remains on CPAP - Failed various meds including TCA (Nortriptyline), SSRI (Lexapro, various doses of Zoloft), SNRI (Cymbalta, Venlafaxine) - Not established with Psychiatry / Therapist  Plan: 1. Continue current Sertraline 100mg  daily, continue Wellbutrin XL 300mg  daily - Discussed agree to not make change today - will do fine tuning with symptom relief meds and avoid a transition off and changing med, limited other options, if she is sleeping better and better rest will have better mood/energy / less pain 2. Consider other SSRI/SNRI options if need 3. Follow-up 6 months annual - may consider future psych if need      Fibromyalgia    Stable chronic problem, related to MDD / insomnia - Failed various meds including Gabapentin, TCA (Nortriptyline), SSRI, SNRI (Cymbalta, Venlafaxine)  Plan: Goal to break cycle with improve sleep for better symptoms relief  1. Will increase Gabapentin from 600mg  BID to 600 AM / 900mg  (1.5 tab) PM - may increase to 1200mg  at bedtime if need in future - notify office when ready for higher  dose or new rx - Also may take Flexeril more regularly in PM 2. Continue current med regimen, Sertraline, Wellbutrin 3. Future follow-up 6 months - consider other SSRI/SNRI options - consider Savella for fibro as well      Insomnia    Likely secondary to OSA / fibro / depression/anxiety On CPAP, per Dr Maxwell Caul  Plan: Goal to break cycle with improve sleep for better symptoms relief - Continue CPAP - Will increase Gabapentin from 600mg  BID to 600 AM / 900mg  (1.5 tab) PM - may increase to 1200mg  at bedtime if need in future - notify office when ready for higher dose or new rx - Also may take Flexeril more regularly in PM - Continue current med regimen, Sertraline, Wellbutrin - Future follow-up 6 months - consider other SSRI/SNRI options - consider Savella for fibro as well      OSA on CPAP    Well controlled, chronic OSA on CPAP - Continue current CPAP therapy, patient seems to be benefiting from therapy (still goal to improve weight) Future if need follow-up with Dr Maxwell Caul as needed for review of CPAP         No orders of the defined types were placed in this encounter.   Follow up plan: Return in about 6 months (around 11/21/2017) for Annual Physical.  She will get labs completed through [facility] and bring copy to her annual physical, no additional lab draw needed at this time.  [name], [facility] 05/22/2017, 9:04 AM

## 2017-05-22 NOTE — Assessment & Plan Note (Addendum)
Stable chronic problem, related to MDD / insomnia - Failed various meds including Gabapentin, TCA (Nortriptyline), SSRI, SNRI (Cymbalta, Venlafaxine)  Plan: Goal to break cycle with improve sleep for better symptoms relief  1. Will increase Gabapentin from 600mg  BID to 600 AM / 900mg  (1.5 tab) PM - may increase to 1200mg  at bedtime if need in future - notify office when ready for higher dose or new rx - Also may take Flexeril more regularly in PM 2. Continue current med regimen, Sertraline, Wellbutrin 3. Future follow-up 6 months - consider other SSRI/SNRI options - consider Savella for fibro as well

## 2017-05-22 NOTE — Assessment & Plan Note (Signed)
Recent worsening but overall still improved chronic MDD Complicated by fibromyalgia and insomnia, OSA - remains on CPAP - Failed various meds including TCA (Nortriptyline), SSRI (Lexapro, various doses of Zoloft), SNRI (Cymbalta, Venlafaxine) - Not established with Psychiatry / Therapist  Plan: 1. Continue current Sertraline 100mg  daily, continue Wellbutrin XL 300mg  daily - Discussed agree to not make change today - will do fine tuning with symptom relief meds and avoid a transition off and changing med, limited other options, if she is sleeping better and better rest will have better mood/energy / less pain 2. Consider other SSRI/SNRI options if need 3. Follow-up 6 months annual - may consider future psych if need

## 2017-08-16 ENCOUNTER — Other Ambulatory Visit: Payer: Self-pay | Admitting: Family Medicine

## 2017-08-16 DIAGNOSIS — F331 Major depressive disorder, recurrent, moderate: Secondary | ICD-10-CM

## 2017-08-16 DIAGNOSIS — F99 Mental disorder, not otherwise specified: Secondary | ICD-10-CM

## 2017-08-16 DIAGNOSIS — F5105 Insomnia due to other mental disorder: Secondary | ICD-10-CM

## 2017-08-27 ENCOUNTER — Other Ambulatory Visit: Payer: Self-pay | Admitting: Family Medicine

## 2017-08-27 DIAGNOSIS — M797 Fibromyalgia: Secondary | ICD-10-CM

## 2017-09-05 LAB — MEASLES/MUMPS/RUBELLA IMMUNITY
MUMPS ABS, IGG: >300
RUBEOLA AB, IGG: <25 — AB
Rubella Antibodies, IGG: 1.08

## 2017-09-05 LAB — BASIC METABOLIC PANEL
CREATININE: 1.2 — AB (ref 0.5–1.1)
GLUCOSE: 98

## 2017-09-05 LAB — LIPID PANEL
CHOLESTEROL: 216 — AB (ref 0–200)
HDL: 59 (ref 35–70)
LDL Cholesterol: 137
TRIGLYCERIDES: 102 (ref 40–160)

## 2017-09-05 LAB — HEMOGLOBIN A1C: Hemoglobin A1C: 5.6

## 2017-09-18 ENCOUNTER — Ambulatory Visit
Admission: EM | Admit: 2017-09-18 | Discharge: 2017-09-18 | Disposition: A | Discharge status: Home / Self Care | Payer: Managed Care, Other (non HMO)

## 2017-09-18 ENCOUNTER — Encounter: Payer: Self-pay | Admitting: Emergency Medicine

## 2017-09-18 DIAGNOSIS — M25562 Pain in left knee: Secondary | ICD-10-CM | POA: Diagnosis not present

## 2017-09-18 NOTE — ED Provider Notes (Signed)
[facility]    CSN: [id number] Arrival date & time: 09/18/17  1008     History   Chief Complaint Chief Complaint  Patient presents with  . Knee Pain    left    HPI Rachel Townsend is a 57 y.o. female.   57 year old female presents with left knee pain that has become more severe in the past week. She would have occasional knee pain with certain movements but now the pain is sharp and brings her to tears when it occurs. Pain is mostly along the medial area. Usually straightening her knee helps with pain and bending makes it worse. Denies any fever, radiation of pain or numbness. She has not taken anything additional for pain and swelling since she is currently on Etodolac, Flexeril and Neurontin daily for arthritis and fibromyalgia. Other chronic health issues include HTN, hyperlipidemia and seasonal allergies and currently on Lisinopril, Lipitor, Zoloft, Wellbutrin, Claritin and vit B and D daily.   The history is provided by the patient.    Past Medical History:  Diagnosis Date  . Allergy   . Fibromyalgia   . Hypertension   . Wrist pain     Patient Active Problem List   Diagnosis Date Noted  . De Quervain's tenosynovitis, right 08/08/2016  . Hyperlipidemia 05/24/2016  . OSA on CPAP 05/04/2016  . Chronic fatigue 08/17/2015  . Sciatica 05/14/2015  . Obesity (BMI 30.0-34.9) 03/02/2015  . Fibromyalgia 07/16/2014  . Depression 07/16/2014  . Insomnia 07/16/2014  . Hypertension 07/16/2014    Past Surgical History:  Procedure Laterality Date  . BREAST BIOPSY Left    core- neg  . BREAST BIOPSY Left    stereo- neg  . ECTOPIC PREGNANCY SURGERY    . FOOT SURGERY      OB History   None      Home Medications    Prior to Admission medications   Medication Sig Start Date End Date Taking? Authorizing Provider  atorvastatin (LIPITOR) 20 MG tablet TAKE 1 TABLET BY MOUTH  DAILY 05/02/17  Yes Karamalegos, Devonne Doughty, DO  b complex vitamins tablet Take 1 tablet  by mouth daily.   Yes [provider]  buPROPion (WELLBUTRIN XL) 300 MG 24 hr tablet TAKE 1 TABLET BY MOUTH  DAILY 05/05/17  Yes Karamalegos, Devonne Doughty, DO  Calcium-Vitamin D 600-200 MG-UNIT tablet Take 1 tablet by mouth 2 (two) times daily.    Yes [provider]  co-enzyme Q-10 50 MG capsule Take 100 mg by mouth daily.   Yes [provider]  cyclobenzaprine (FLEXERIL) 10 MG tablet TAKE 1 TABLET BY MOUTH AT  BEDTIME 05/05/17  Yes Karamalegos, Devonne Doughty, DO  etodolac (LODINE XL) 500 MG 24 hr tablet Take 1 tablet (500 mg total) by mouth 2 (two) times daily. 05/05/17  Yes Karamalegos, Devonne Doughty, DO  gabapentin (NEURONTIN) 600 MG tablet TAKE 1 AND 1/2 TABLETS BY  MOUTH TWO TIMES DAILY 08/28/17  Yes Karamalegos, Devonne Doughty, DO  lisinopril (PRINIVIL,ZESTRIL) 5 MG tablet TAKE 1 TABLET BY MOUTH  DAILY 05/02/17  Yes Karamalegos, Devonne Doughty, DO  loratadine (CLARITIN) 10 MG tablet Take 10 mg by mouth daily.    Yes [provider]  sertraline (ZOLOFT) 100 MG tablet TAKE 1 TABLET BY MOUTH  DAILY 08/16/17  Yes Karamalegos, Devonne Doughty, DO  Acetaminophen (TYLENOL ARTHRITIS PAIN PO) Take by mouth.    [provider]  albuterol (PROVENTIL HFA;VENTOLIN HFA) 108 (90 Base) MCG/ACT inhaler Inhale 2 puffs into  the lungs every 4 (four) hours as needed for wheezing or shortness of breath. 04/11/16   Frederich Cha, MD  Cholecalciferol (VITAMIN D-1000 MAX ST) 1000 units tablet Take 1,000 Units by mouth daily.    [provider]  NIACIN CR PO Take 1 tablet by mouth daily.     [provider]    Family History Family History  Problem Relation Age of Onset  . Hypertension Mother   . Diabetes Mother   . Hyperlipidemia Mother   . Cancer Mother   . Breast cancer Mother 64  . Hypertension Father   . Hyperlipidemia Father     Social History Social History   Tobacco Use  . Smoking status: Former Smoker    Last attempt to quit: 11/23/1984    Years since  quitting: 32.8  . Smokeless tobacco: Never Used  Substance Use Topics  . Alcohol use: Yes    Alcohol/week: 10.0 standard drinks    Types: 10 Glasses of wine per week  . Drug use: No     Allergies   Patient has no known allergies.   Review of Systems Review of Systems  Constitutional: Positive for activity change. Negative for appetite change, chills, fatigue and fever.  Respiratory: Negative for cough, chest tightness, shortness of breath and wheezing.   Cardiovascular: Negative for chest pain and leg swelling.  Gastrointestinal: Negative for nausea and vomiting.  Musculoskeletal: Positive for arthralgias, gait problem and myalgias. Negative for joint swelling.  Skin: Negative for color change, rash and wound.  Neurological: Negative for tremors, seizures, syncope, weakness, numbness and headaches.  Hematological: Negative for adenopathy. Does not bruise/bleed easily.  Psychiatric/Behavioral: Positive for sleep disturbance.     Physical Exam Triage Vital Signs ED Triage Vitals  Enc Vitals Group     BP 09/18/17 1019 118/84     Pulse Rate 09/18/17 1019 79     Resp 09/18/17 1019 16     Temp 09/18/17 1019 98.1 F (36.7 C)     Temp Source 09/18/17 1019 Oral     SpO2 09/18/17 1019 99 %     Weight 09/18/17 1016 220 lb (99.8 kg)     Height 09/18/17 1016 5\' 3"  (1.6 m)     Head Circumference --      Peak Flow --      Pain Score 09/18/17 1016 1     Pain Loc --      Pain Edu? --      Excl. in [place]? --    No data found.  Updated Vital Signs BP 118/84 (BP Location: Right Arm)   Pulse 79   Temp 98.1 F (36.7 C) (Oral)   Resp 16   Ht 5\' 3"  (1.6 m)   Wt 220 lb (99.8 kg)   SpO2 99%   BMI 38.97 kg/m   Visual Acuity Right Eye Distance:   Left Eye Distance:   Bilateral Distance:    Right Eye Near:   Left Eye Near:    Bilateral Near:     Physical Exam  Constitutional: She is oriented to person, place, and time. She appears well-developed and well-nourished. She does  not appear ill. No distress.  Patient sitting in exam chair in no acute distress.   HENT:  Head: Normocephalic and atraumatic.  Eyes: Conjunctivae and EOM are normal.  Neck: Normal range of motion.  Cardiovascular: Normal rate.  Pulmonary/Chest: Effort normal.  Musculoskeletal: She exhibits tenderness.       Left knee: She exhibits decreased  range of motion. She exhibits no swelling, no effusion, no ecchymosis, no deformity, no erythema, normal alignment, no LCL laxity and normal patellar mobility. Tenderness found. Medial joint line tenderness noted.       Legs: Decreased range of motion of left knee- particularly with flexion. However, when I started to straighten her knee from a flexed position, she experienced severe pain in the medial area which brought her to tears and further evaluation of movement could not be completed. No distinct swelling. No redness. Good distal pulses and no neuro deficits noted.   Neurological: She is alert and oriented to person, place, and time.  Skin: Skin is warm and dry. Capillary refill takes less than 2 seconds. No rash noted. No erythema.  Psychiatric: She has a normal mood and affect. Her behavior is normal. Judgment and thought content normal.  Vitals reviewed.    UC Treatments / Results  Labs (all labs ordered are listed, but only abnormal results are displayed) Labs Reviewed - No data to display  EKG None  Radiology No results found.  Procedures Procedures (including critical care time)  Medications Ordered in UC Medications - No data to display  Initial Impression / Assessment and Plan / UC Course  I have reviewed the triage vital signs and the nursing notes.  Pertinent labs & imaging results that were available during my care of the patient were reviewed by me and considered in my medical decision making (see chart for details).    Discussed with patient that she needs further evaluation of her knee pain. May be a tendon or  ligament partial tear or meniscus issue. Offered pain medication but patient declined. Discussed since she is only available today to see a provider, that she go to [facility] today at 1pm for further evaluation. May need a MRI. Follow-up with [facility] today as planned.  Final Clinical Impressions(s) / UC Diagnoses   Final diagnoses:  Left medial knee pain     Discharge Instructions     Recommend go to [facility] today at 1pm for further evaluation of knee pain.     ED Prescriptions    None     Controlled Substance Prescriptions [place] Controlled Substance Registry consulted? Yes, I have consulted the [place] Controlled Substances Registry for this patient. No controlled medication prescribed today.    Katy Apo, NP 09/18/17 1151

## 2017-09-18 NOTE — ED Triage Notes (Signed)
Patient c/o Left knee pain that for a week.  Patient denies injury or fall.

## 2017-09-18 NOTE — Discharge Instructions (Addendum)
Recommend go to [facility] today at 1pm for further evaluation of knee pain.

## 2017-11-06 ENCOUNTER — Encounter: Payer: Managed Care, Other (non HMO) | Admitting: Family Medicine

## 2017-11-22 ENCOUNTER — Encounter: Payer: Self-pay | Admitting: Family Medicine

## 2017-11-22 ENCOUNTER — Ambulatory Visit (INDEPENDENT_AMBULATORY_CARE_PROVIDER_SITE_OTHER): Payer: Managed Care, Other (non HMO) | Admitting: Family Medicine

## 2017-11-22 ENCOUNTER — Other Ambulatory Visit: Payer: Self-pay | Admitting: Family Medicine

## 2017-11-22 VITALS — BP 130/73 | HR 84 | Temp 97.4°F | Resp 16 | Ht 64.0 in | Wt 226.8 lb

## 2017-11-22 DIAGNOSIS — I10 Essential (primary) hypertension: Secondary | ICD-10-CM

## 2017-11-22 DIAGNOSIS — E669 Obesity, unspecified: Secondary | ICD-10-CM

## 2017-11-22 DIAGNOSIS — R7309 Other abnormal glucose: Secondary | ICD-10-CM

## 2017-11-22 DIAGNOSIS — Z789 Other specified health status: Secondary | ICD-10-CM

## 2017-11-22 DIAGNOSIS — R7989 Other specified abnormal findings of blood chemistry: Secondary | ICD-10-CM

## 2017-11-22 DIAGNOSIS — Z0000 Encounter for general adult medical examination without abnormal findings: Secondary | ICD-10-CM | POA: Diagnosis not present

## 2017-11-22 DIAGNOSIS — Z23 Encounter for immunization: Secondary | ICD-10-CM

## 2017-11-22 DIAGNOSIS — Z9989 Dependence on other enabling machines and devices: Secondary | ICD-10-CM

## 2017-11-22 DIAGNOSIS — F339 Major depressive disorder, recurrent, unspecified: Secondary | ICD-10-CM

## 2017-11-22 DIAGNOSIS — Z1239 Encounter for other screening for malignant neoplasm of breast: Secondary | ICD-10-CM

## 2017-11-22 DIAGNOSIS — E782 Mixed hyperlipidemia: Secondary | ICD-10-CM

## 2017-11-22 DIAGNOSIS — G4733 Obstructive sleep apnea (adult) (pediatric): Secondary | ICD-10-CM

## 2017-11-22 NOTE — Progress Notes (Addendum)
Subjective:    Patient ID: Rachel Townsend, female    DOB: 03-12-1960, 57 y.o.   MRN: [id number]  Rachel Townsend is a 57 y.o. female presenting on 11/22/2017 for Annual Exam   HPI  Here for Annual Physical and Due for yearly labs. 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 Current Meds - Lisinopril 19m   Reports good compliance, took meds today. Tolerating well, w/o complaints.  FOLLOW-UP Insomnia / OSA on CPAP Continued treat with CPAP for OSA per Dr OMaxwell Caul see prior notes for background information. - Today patient reports doing very well, see below improved mood. Now rested at night and when wakes up in AM, able to sleep well with CPAP. She adheres to machine and uses it nightly avg >8 hours nightly - Improved weight loss and more energy - No new concerns or symptoms - Insomnia is improved now - Admits to some cramping in lower leg / calves at night occasionally, thinks related to hydration  FOLLOW-UP DEPRESSION / FIBROMYALGIA: See prior notes for background information. - Today patient reports no new concerns. Mood remains improved much better - Still has generalized pain from fibromyalgia, but seems improved with better sleep - Continues Sertraline and Wellbutrin - Takes Gabapentin 9060m(1.5 tabs 6009mBID Taking NSAID Etolodac  Elevated Creatinine Recent lab shows mild elevated Cr 1.16, previously had been improved On NSAID Etolodac 500m23mD - taking Tylenol PRN Tries to stay hydrated, not always  Health Maintenance: Due for mammogram, requested order, last done  2 yr ago  Depression screen PHQ [facility] 11/23/2017 05/22/2017 11/21/2016  Decreased Interest 2 1 0  Down, Depressed, Hopeless 1 1 0  PHQ - 2 Score 3 2 0  Altered sleeping 0 2 0  Tired, decreased energy '1 2 1  ' Change in appetite '3 2 1  ' Feeling bad or failure about yourself  0 0 0  Trouble concentrating 0 0 0  Moving slowly or fidgety/restless 0 0 0  Suicidal thoughts 0 0 0  PHQ-9 Score '7 8 2  ' Difficult doing work/chores Somewhat difficult Somewhat difficult Not difficult at all  Some recent data might be hidden    Past Medical History:  Diagnosis Date  . Allergy   . Fibromyalgia   . Hypertension   . Wrist pain    Past Surgical History:  Procedure Laterality Date  . BREAST BIOPSY Left    core- neg  . BREAST BIOPSY Left    stereo- neg  . ECTOPIC PREGNANCY SURGERY    . FOOT SURGERY     Social History   Socioeconomic History  . Marital status: Married    Spouse name: Not on file  . Number of children: Not on file  . Years of education: Not on file  . Highest education level: Not on file  Occupational History  . 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 Financial resource strain: Not on file  . Food insecurity:    Worry: Not on file    Inability: Not on file  . Transportation needs:  Medical: Not on file    Non-medical: Not on file  Tobacco Use  . Smoking status: Former Smoker    Last attempt to quit: 11/23/1984    Years since quitting: 33.0  . Smokeless tobacco: Former Network engineer and Sexual Activity  . Alcohol use: Yes    Alcohol/week: 10.0 standard drinks    Types: 10 Glasses of wine per week  . Drug use: No  . Sexual activity: Yes    Birth control/protection: Injection  Lifestyle  . Physical activity:    Days per week: Not on file    Minutes per session: Not on file  . Stress: Not on file  Relationships  . Social connections:    Talks on phone: Not on file    Gets together: Not on file    Attends religious service: Not on file     Active member of club or organization: Not on file    Attends meetings of clubs or organizations: Not on file    Relationship status: Not on file  . Intimate partner violence:    Fear of current or ex partner: Not on file    Emotionally abused: Not on file    Physically abused: Not on file    Forced sexual activity: Not on file  Other Topics Concern  . Not on file  Social History Narrative  . Not on file   Family History  Problem Relation Age of Onset  . Hypertension Mother   . Diabetes Mother   . Hyperlipidemia Mother   . Cancer Mother   . Breast cancer Mother 72  . Hypertension Father   . Hyperlipidemia Father    Current Outpatient Medications on File Prior to Visit  Medication Sig  . Acetaminophen (TYLENOL ARTHRITIS PAIN PO) Take by mouth.  Marland Kitchen atorvastatin (LIPITOR) 20 MG tablet TAKE 1 TABLET BY MOUTH  DAILY  . b complex vitamins tablet Take 1 tablet by mouth daily.  Marland Kitchen buPROPion (WELLBUTRIN XL) 300 MG 24 hr tablet TAKE 1 TABLET BY MOUTH  DAILY  . Calcium-Vitamin D 600-200 MG-UNIT tablet Take 1 tablet by mouth 2 (two) times daily.   Marland Kitchen co-enzyme Q-10 50 MG capsule Take 100 mg by mouth daily.  . cyclobenzaprine (FLEXERIL) 10 MG tablet TAKE 1 TABLET BY MOUTH AT  BEDTIME  . etodolac (LODINE XL) 500 MG 24 hr tablet Take 1 tablet (500 mg total) by mouth 2 (two) times daily.  Marland Kitchen gabapentin (NEURONTIN) 600 MG tablet TAKE 1 AND 1/2 TABLETS BY  MOUTH TWO TIMES DAILY  . lisinopril (PRINIVIL,ZESTRIL) 5 MG tablet TAKE 1 TABLET BY MOUTH  DAILY  . loratadine (CLARITIN) 10 MG tablet Take 10 mg by mouth daily.   . sertraline (ZOLOFT) 100 MG tablet TAKE 1 TABLET BY MOUTH  DAILY  . Specialty Vitamins Products (MAGNESIUM, AMINO ACID CHELATE,) 133 MG tablet Take 1 tablet by mouth 2 (two) times daily.  Marland Kitchen albuterol (PROVENTIL HFA;VENTOLIN HFA) 108 (90 Base) MCG/ACT inhaler Inhale 2 puffs into the lungs every 4 (four) hours as needed for wheezing or shortness of breath. (Patient not taking: Reported on  11/22/2017)  . Cholecalciferol (VITAMIN D-1000 MAX ST) 1000 units tablet Take 1,000 Units by mouth daily.  Marland Kitchen NIACIN CR PO Take 1 tablet by mouth daily.    No current facility-administered medications on file prior to visit.     Review of Systems  Constitutional: Negative for activity change, appetite change, chills, diaphoresis, fatigue and fever.  HENT: Negative for congestion and  hearing loss.   Eyes: Negative for visual disturbance.  Respiratory: Negative for apnea, cough, choking, chest tightness, shortness of breath and wheezing.   Cardiovascular: Negative for chest pain, palpitations and leg swelling.  Gastrointestinal: Negative for abdominal pain, constipation, diarrhea, nausea and vomiting.  Endocrine: Negative for cold intolerance.  Genitourinary: Negative for difficulty urinating, dysuria, frequency and hematuria.  Musculoskeletal: Negative for arthralgias and neck pain.  Skin: Negative for rash.  Allergic/Immunologic: Negative for environmental allergies.  Neurological: Negative for dizziness, weakness, light-headedness, numbness and headaches.  Hematological: Negative for adenopathy.  Psychiatric/Behavioral: Negative for behavioral problems, dysphoric mood and sleep disturbance.   Per HPI unless specifically indicated above     Objective:    BP 130/73   Pulse 84   Temp (!) 97.4 F (36.3 C) (Oral)   Resp 16   Ht '5\' 4"'  (1.626 m)   Wt 226 lb 12.8 oz (102.9 kg)   BMI 38.93 kg/m   Wt Readings from Last 3 Encounters:  11/22/17 226 lb 12.8 oz (102.9 kg)  09/18/17 220 lb (99.8 kg)  05/22/17 216 lb (98 kg)    Physical Exam  Constitutional: She is oriented to person, place, and time. She appears well-developed and well-nourished. No distress.  Well-appearing, comfortable, cooperative, obese  HENT:  Head: Normocephalic and atraumatic.  Mouth/Throat: Oropharynx is clear and moist.  Eyes: Pupils are equal, round, and reactive to light. Conjunctivae and EOM are normal.  Right eye exhibits no discharge. Left eye exhibits no discharge.  Neck: Normal range of motion. Neck supple. No thyromegaly present.  Cardiovascular: Normal rate, regular rhythm, normal heart sounds and intact distal pulses.  No murmur heard. Pulmonary/Chest: Effort normal and breath sounds normal. No respiratory distress. She has no wheezes. She has no rales.  Abdominal: Soft. Bowel sounds are normal. She exhibits no distension and no mass. There is no tenderness.  Musculoskeletal: Normal range of motion. She exhibits no edema or tenderness.  Upper / Lower Extremities: - Normal muscle tone, strength bilateral upper extremities 5/5, lower extremities 5/5  Lymphadenopathy:    She has no cervical adenopathy.  Neurological: She is alert and oriented to person, place, and time.  Distal sensation intact to light touch all extremities  Skin: Skin is warm and dry. No rash noted. She is not diaphoretic. No erythema.  Psychiatric: She has a normal mood and affect. Her behavior is normal.  Well groomed, good eye contact, normal speech and thoughts  Nursing note and vitals reviewed.  Results for orders placed or performed in visit on 09/98/33  Basic metabolic panel  Result Value Ref Range   Glucose 98    Creatinine 1.2 (A) 0.5 - 1.1  Lipid panel  Result Value Ref Range   Triglycerides 102 40 - 160   Cholesterol 216 (A) 0 - 200   HDL 59 35 - 70   LDL Cholesterol 137   Hemoglobin A1c  Result Value Ref Range   Hemoglobin A1C 5.6   Measles/Mumps/Rubella Immunity  Result Value Ref Range   Rubella Antibodies, IGG 1.08    RUBEOLA AB, IGG <25 (A)    MUMPS ABS, IGG >300       Assessment & Plan:   Problem List Items Addressed This Visit    Hyperlipidemia    Mostly controlled lipids On Statin      Hypertension    Well-controlled HTN, on minimal medication - Home BP readings not checking  No known complications    Plan:  1. Continue current BP  regimen - Lisinopril 82m daily 2.  Encourage improved lifestyle - low sodium diet, regular exercise 3. May consider monitor BP outside office, bring readings to next visit, if persistently >140/90 or new symptoms notify office sooner 4. Follow-up q 6 mo      Major depression, recurrent, chronic (HCC)    Improved, stable Complicated by fibromyalgia and insomnia, OSA - remains on CPAP - Failed various meds including TCA (Nortriptyline), SSRI (Lexapro, various doses of Zoloft), SNRI (Cymbalta, Venlafaxine) - Not established with Psychiatry / Therapist  Plan: 1. Continue current Sertraline 1061mdaily, continue Wellbutrin XL 30088maily 2. Consider other SSRI/SNRI options if need 3. Follow-up 6 months annual - may consider future psych if need      Morbid obesity ([facility]  Worsening weight gain 10 lbs in 6 months attributed to stress eating and limited lifestyle factors, sometimes limited by generalized pain with less exercise - Comorbid HTN, only mild abnormal cholesterol in past, normal glucose previosly  Encouraged to work on gradually improving regular exercise as tolerated  Goal now on [place]   OSA on CPAP    Well controlled, chronic OSA on CPAP - Continue current CPAP therapy, patient seems to be benefiting from therapy (still goal to improve weight) Future if need follow-up with Dr OsbMaxwell Caul needed for review of CPAP       Other Visit Diagnoses    Annual physical exam    -  Primary  Updated Health Maintenance information Reviewed recent lab results with patient Encouraged improvement to lifestyle with diet and exercise - Goal of weight loss    Screening for breast cancer       Relevant Orders   MM DIGITAL SCREENING BILATERAL   Elevated serum creatinine     Recent elevated Cr, possibly w/ hydration and frequent NSAID intake Counseling on limit NSAID Improve hydration Re-check 6 months    Elevated hemoglobin A1c     Abstract value from [facility] Lifestye improvement, diet Will  re-check in 6 months    Measles, mumps, rubella (MMR) vaccination status unknown     Appears not to be fully immune to measles Will consider repeat titer MMR vs future vaccine if safe for husband who is immunocompromised at home   Relevant Orders   Measles/Mumps/Rubella Immunity   Needs flu shot       Relevant Orders   Flu Vaccine QUAD 6+ mos PF IM (Fluarix Quad PF) (Completed)      No orders of the defined types were placed in this encounter.   Follow up plan: Return in about 6 months (around 05/24/2018) for 6 month follow-up lab results - HTN, Cr trend, OA, Sugar.  Future labs printed for MMR titer if need to repeat Future labs printed for [facility] - A1c, Lipid, CMET for 6 months f/u  AleNobie PutnamO [place] 11/22/2017, 3:13 PM

## 2017-11-22 NOTE — Patient Instructions (Addendum)
Thank you for coming to the office today.  Flu shot today  Completed plan for diet / BMI - for form  We will check into MMR safety for Randy at home - stay tuned - printed order, most likely we can re-check this once before re-peat vaccine, it should be safe though I will give you final answer  For Mammogram screening for breast cancer   Call the [place] below anytime to schedule your own appointment now that order has been placed.  [facility] [place], [place] [address] Phone: [phone]  Elevated creatinine on the labs - we will re-check again in 6 months, stay well hydrated before and day of lab, may need to adjust anti inflammatory in future   DUE for FASTING BLOOD WORK (no food or drink after midnight before the lab appointment, only water or coffee without cream/sugar on the morning of)  SCHEDULE "Lab Only" visit in the morning at the clinic for lab draw in 6 MONTHS   - Make sure Lab Only appointment is at about 1 week before your next appointment, so that results will be available  For Lab Results, once available within 2-3 days of blood draw, you can can log in to MyChart online to view your results and a brief explanation. Also, we can discuss results at next follow-up visit.   Please schedule a Follow-up Appointment to: Return in about 6 months (around 05/24/2018) for 6 month follow-up lab results - HTN, Cr trend, OA, Sugar.  If you have any other questions or concerns, please feel free to call the office or send a message through [place]. You may also schedule an earlier appointment if necessary.  Additionally, you may be receiving a survey about your experience at our office within a few days to 1 week by e-mail or mail. We value your feedback.  [name], DO [place]

## 2017-11-23 ENCOUNTER — Encounter: Payer: Self-pay | Admitting: Family Medicine

## 2017-11-23 NOTE — Assessment & Plan Note (Signed)
Mostly controlled lipids On Statin

## 2017-11-23 NOTE — Assessment & Plan Note (Signed)
Improved, stable Complicated by fibromyalgia and insomnia, OSA - remains on CPAP - Failed various meds including TCA (Nortriptyline), SSRI (Lexapro, various doses of Zoloft), SNRI (Cymbalta, Venlafaxine) - Not established with Psychiatry / Therapist  Plan: 1. Continue current Sertraline 100mg  daily, continue Wellbutrin XL 300mg  daily 2. Consider other SSRI/SNRI options if need 3. Follow-up 6 months annual - may consider future psych if need

## 2017-11-23 NOTE — Assessment & Plan Note (Signed)
Well controlled, chronic OSA on CPAP - Continue current CPAP therapy, patient seems to be benefiting from therapy (still goal to improve weight) Future if need follow-up with Dr Maxwell Caul as needed for review of CPAP

## 2017-11-23 NOTE — Assessment & Plan Note (Addendum)
Worsening weight gain 10 lbs in 6 months attributed to stress eating and limited lifestyle factors, sometimes limited by generalized pain with less exercise - Comorbid HTN, only mild abnormal cholesterol in past, normal glucose previosly  Encouraged to work on gradually improving regular exercise as tolerated  Goal now on [place]

## 2017-11-23 NOTE — Assessment & Plan Note (Signed)
Well-controlled HTN, on minimal medication - Home BP readings not checking  No known complications    Plan:  1. Continue current BP regimen - Lisinopril 5mg  daily 2. Encourage improved lifestyle - low sodium diet, regular exercise 3. May consider monitor BP outside office, bring readings to next visit, if persistently >140/90 or new symptoms notify office sooner 4. Follow-up q 6 mo

## 2017-11-26 ENCOUNTER — Other Ambulatory Visit: Payer: Self-pay | Admitting: Family Medicine

## 2017-11-26 DIAGNOSIS — M797 Fibromyalgia: Secondary | ICD-10-CM

## 2017-11-27 ENCOUNTER — Telehealth: Payer: Self-pay | Admitting: Family Medicine

## 2017-11-27 ENCOUNTER — Encounter: Payer: Managed Care, Other (non HMO) | Admitting: Family Medicine

## 2017-11-27 NOTE — Telephone Encounter (Signed)
Please notify patient that it would be safe for her to get MMR vaccine. I did some research into her question and it is safe for her husband (weak immune system) to be around her after MMR vaccine.  It is recommended for her to get MMR vaccine to make sure her measles immunity is boosted, this would likely be her 2nd dose, and that would be last one. We can re-check immunity in the future or through [place] at work.  She may schedule for nurse visit for MMR at her convenience.  [name], DO [facility] 11/27/2017, 6:05 PM

## 2017-11-28 NOTE — Telephone Encounter (Signed)
Left message for patient to call back  

## 2017-11-29 NOTE — Telephone Encounter (Signed)
Patient advised as per Dr K. 

## 2018-02-08 ENCOUNTER — Other Ambulatory Visit: Payer: Self-pay | Admitting: Family Medicine

## 2018-02-08 DIAGNOSIS — M1990 Unspecified osteoarthritis, unspecified site: Secondary | ICD-10-CM

## 2018-02-08 DIAGNOSIS — I10 Essential (primary) hypertension: Secondary | ICD-10-CM

## 2018-05-28 ENCOUNTER — Ambulatory Visit: Payer: Managed Care, Other (non HMO) | Admitting: Family Medicine

## 2018-06-25 ENCOUNTER — Other Ambulatory Visit: Payer: Self-pay | Admitting: Family Medicine

## 2018-06-25 DIAGNOSIS — F5105 Insomnia due to other mental disorder: Secondary | ICD-10-CM

## 2018-06-25 DIAGNOSIS — E782 Mixed hyperlipidemia: Secondary | ICD-10-CM

## 2018-06-25 DIAGNOSIS — F99 Mental disorder, not otherwise specified: Secondary | ICD-10-CM

## 2018-06-25 DIAGNOSIS — F331 Major depressive disorder, recurrent, moderate: Secondary | ICD-10-CM

## 2018-09-27 ENCOUNTER — Other Ambulatory Visit: Payer: Self-pay | Admitting: Family Medicine

## 2018-09-27 DIAGNOSIS — M797 Fibromyalgia: Secondary | ICD-10-CM

## 2018-10-22 ENCOUNTER — Telehealth: Payer: Self-pay

## 2018-10-22 ENCOUNTER — Other Ambulatory Visit: Payer: Self-pay | Admitting: Family Medicine

## 2018-10-22 DIAGNOSIS — Z1231 Encounter for screening mammogram for malignant neoplasm of breast: Secondary | ICD-10-CM

## 2018-10-22 DIAGNOSIS — Z0000 Encounter for general adult medical examination without abnormal findings: Secondary | ICD-10-CM

## 2018-10-22 NOTE — Telephone Encounter (Signed)
Labs are ordered 

## 2018-10-22 NOTE — Telephone Encounter (Signed)
This pt is calling requesting her physical/ pap with you. Can you place lab order for [facility] for her upcoming physical scheduled for 9/29.

## 2018-10-25 LAB — COMPREHENSIVE METABOLIC PANEL
ALT: 24 IU/L (ref 0–32)
AST: 20 IU/L (ref 0–40)
Albumin/Globulin Ratio: 2 (ref 1.2–2.2)
Albumin: 4.3 g/dL (ref 3.8–4.9)
Alkaline Phosphatase: 92 IU/L (ref 39–117)
BUN/Creatinine Ratio: 15 (ref 9–23)
BUN: 19 mg/dL (ref 6–24)
Bilirubin Total: 0.5 mg/dL (ref 0.0–1.2)
CO2: 25 mmol/L (ref 20–29)
Calcium: 9.8 mg/dL (ref 8.7–10.2)
Chloride: 102 mmol/L (ref 96–106)
Creatinine, Ser: 1.3 mg/dL — ABNORMAL HIGH (ref 0.57–1.00)
GFR calc Af Amer: 52 mL/min/{1.73_m2} — ABNORMAL LOW (ref >59)
GFR calc non Af Amer: 45 mL/min/{1.73_m2} — ABNORMAL LOW (ref >59)
Globulin, Total: 2.1 g/dL (ref 1.5–4.5)
Glucose: 102 mg/dL — ABNORMAL HIGH (ref 65–99)
Potassium: 4.8 mmol/L (ref 3.5–5.2)
Sodium: 139 mmol/L (ref 134–144)
Total Protein: 6.4 g/dL (ref 6.0–8.5)

## 2018-10-25 LAB — CBC WITH DIFFERENTIAL/PLATELET
Basophils Absolute: 0 10*3/uL (ref 0.0–0.2)
Basos: 1 %
EOS (ABSOLUTE): 0.3 10*3/uL (ref 0.0–0.4)
Eos: 4 %
Hematocrit: 41.8 % (ref 34.0–46.6)
Hemoglobin: 13.5 g/dL (ref 11.1–15.9)
Immature Grans (Abs): 0 10*3/uL (ref 0.0–0.1)
Immature Granulocytes: 0 %
Lymphocytes Absolute: 1.8 10*3/uL (ref 0.7–3.1)
Lymphs: 24 %
MCH: 29.3 pg (ref 26.6–33.0)
MCHC: 32.3 g/dL (ref 31.5–35.7)
MCV: 91 fL (ref 79–97)
Monocytes Absolute: 0.7 10*3/uL (ref 0.1–0.9)
Monocytes: 9 %
Neutrophils Absolute: 4.8 10*3/uL (ref 1.4–7.0)
Neutrophils: 62 %
Platelets: 278 10*3/uL (ref 150–450)
RBC: 4.6 x10E6/uL (ref 3.77–5.28)
RDW: 13.7 % (ref 11.7–15.4)
WBC: 7.7 10*3/uL (ref 3.4–10.8)

## 2018-10-25 LAB — LIPID PANEL
Chol/HDL Ratio: 3.5 ratio (ref 0.0–4.4)
Cholesterol, Total: 203 mg/dL — ABNORMAL HIGH (ref 100–199)
HDL: 58 mg/dL (ref >39)
LDL Chol Calc (NIH): 118 mg/dL — ABNORMAL HIGH (ref 0–99)
Triglycerides: 154 mg/dL — ABNORMAL HIGH (ref 0–149)
VLDL Cholesterol Cal: 27 mg/dL (ref 5–40)

## 2018-10-25 LAB — HEMOGLOBIN A1C
Est. average glucose Bld gHb Est-mCnc: 117 mg/dL
Hgb A1c MFr Bld: 5.7 % — ABNORMAL HIGH (ref 4.8–5.6)

## 2018-10-25 LAB — TSH: TSH: 4.37 u[IU]/mL (ref 0.450–4.500)

## 2018-10-30 ENCOUNTER — Encounter: Payer: Self-pay | Admitting: Family Medicine

## 2018-10-30 ENCOUNTER — Encounter: Payer: Self-pay | Admitting: Nurse Practitioner

## 2018-10-30 ENCOUNTER — Ambulatory Visit (INDEPENDENT_AMBULATORY_CARE_PROVIDER_SITE_OTHER): Payer: Managed Care, Other (non HMO) | Admitting: Nurse Practitioner

## 2018-10-30 ENCOUNTER — Other Ambulatory Visit: Payer: Self-pay

## 2018-10-30 ENCOUNTER — Encounter: Payer: Managed Care, Other (non HMO) | Admitting: Family Medicine

## 2018-10-30 VITALS — BP 134/78 | HR 78 | Ht 64.0 in | Wt 228.2 lb

## 2018-10-30 DIAGNOSIS — N183 Chronic kidney disease, stage 3 unspecified: Secondary | ICD-10-CM | POA: Insufficient documentation

## 2018-10-30 DIAGNOSIS — Z780 Asymptomatic menopausal state: Secondary | ICD-10-CM | POA: Diagnosis not present

## 2018-10-30 DIAGNOSIS — Z124 Encounter for screening for malignant neoplasm of cervix: Secondary | ICD-10-CM | POA: Diagnosis not present

## 2018-10-30 DIAGNOSIS — Z23 Encounter for immunization: Secondary | ICD-10-CM

## 2018-10-30 DIAGNOSIS — E782 Mixed hyperlipidemia: Secondary | ICD-10-CM

## 2018-10-30 DIAGNOSIS — Z0000 Encounter for general adult medical examination without abnormal findings: Secondary | ICD-10-CM

## 2018-10-30 MED ORDER — VASCEPA 1 G PO CAPS: 2.0000 | ORAL_CAPSULE | Freq: Two times a day (BID) | ORAL | 3 refills | Status: DC

## 2018-10-30 NOTE — Progress Notes (Signed)
Subjective:    Patient ID: Rachel Townsend, female    DOB: [birth year], 58 y.o.   MRN: [id number]  Rachel Townsend is a 58 y.o. female presenting on 10/30/2018 for Annual Exam   HPI Annual Physical Exam Patient has been feeling well - pain generally well controlled for fibromyalgia to patient's general satisfaction.  They have no other  acute concerns today. Sleeps restlessly (has trouble falling asleep, waking early) 6-7 total hours per night interrupted. Improved with CPAP machine.  Wears 100% of time.  HEALTH MAINTENANCE: Weight/BMI: increased Physical activity: generally sedentary, up and down throughout the day, sedentary after work other than housework Diet: lots of room for improvement, carbs are weakness (pasta, potatoes, bread), sweets occasionally Seatbelt: always Sunscreen: not regularly PAP: due Mammogram: scheduled for next  DEXA: has not had one "for years"  Possibly 15 years ago. Colon Cancer Screen: Needs referral due 5 years for precancerous polyps will be due February 2021 HIV/HEP C: completed Optometry: regular Dentistry: regular  VACCINES: Tetanus: up to date Influenza: reviewed today  Cholesterol  Kidneys- likely new dx CKD stage III Patient with chronic NSAID use.  Takes etodolac bid.  Desires to continue this to keep current activity levels.  Colonoscopy- Patient notes needs repeat colonoscopy 03/2019 with every 5 year surveillance.  Also desires t to discuss hemorrhoid issues - patient notes "acidy BM" and has bleeding, BRBPR only at that time.  Leaks/bleeds/oozes after BM is finished.   Past Medical History:  Diagnosis Date  . Allergy   . Fibromyalgia   . Hypertension   . Wrist pain    Past Surgical History:  Procedure Laterality Date  . BREAST BIOPSY Left    core- neg  . BREAST BIOPSY Left    stereo- neg  . ECTOPIC PREGNANCY SURGERY    . FOOT SURGERY     Social History   Socioeconomic History  . Marital status: Married    Spouse name:  Not on file  . Number of children: Not on file  . Years of education: Not on file  . Highest education level: Not on file  Occupational History  . 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 Financial resource strain: Not on file  . Food insecurity    Worry: Not on file    Inability: Not on file  . Transportation needs    Medical: Not on file    Non-medical: Not on file  Tobacco Use  . Smoking status: Former Smoker    Quit date: 11/23/1984    Years since quitting: 33.9  . Smokeless tobacco: Former Network engineer and Sexual Activity  . Alcohol use: Yes    Alcohol/week: 10.0 standard drinks    Types: 10 Glasses of wine per week    Comment: weekly  . Drug use: No  . Sexual activity: Yes    Birth control/protection: Injection  Lifestyle  . Physical activity    Days per week: Not on file    Minutes per session: Not on file  . Stress: Not on file  Relationships  . Social Herbalist on phone: Not on file    Gets together: Not on file    Attends religious service: Not on file    Active member of club or organization: Not on file    Attends meetings of clubs or organizations: Not on file    Relationship status: Not on file  . Intimate partner violence    Fear of current or  ex partner: Not on file    Emotionally abused: Not on file    Physically abused: Not on file    Forced sexual activity: Not on file  Other Topics Concern  . Not on file  Social History Narrative  . Not on file   Family History  Problem Relation Age of Onset  . Hypertension Mother   . Diabetes Mother   . Hyperlipidemia Mother   . Cancer Mother   . Breast cancer Mother 57  . Hypertension Father   . Hyperlipidemia Father    Current Outpatient Medications on File Prior to Visit  Medication Sig  . Acetaminophen (TYLENOL ARTHRITIS PAIN PO) Take by mouth.  Marland Kitchen atorvastatin (LIPITOR) 20 MG tablet TAKE 1 TABLET BY MOUTH  DAILY  . b complex vitamins tablet Take 1 tablet by mouth  daily.  Marland Kitchen buPROPion (WELLBUTRIN XL) 300 MG 24 hr tablet TAKE 1 TABLET BY MOUTH  DAILY  . Calcium-Vitamin D 600-200 MG-UNIT tablet Take 1 tablet by mouth 2 (two) times daily.   . Cholecalciferol (VITAMIN D-1000 MAX ST) 1000 units tablet Take 1,000 Units by mouth daily.  Marland Kitchen co-enzyme Q-10 50 MG capsule Take 100 mg by mouth daily.  . cyclobenzaprine (FLEXERIL) 10 MG tablet TAKE 1 TABLET BY MOUTH AT  BEDTIME (Patient taking differently: Take 10 mg by mouth at bedtime as needed. )  . etodolac (LODINE XL) 500 MG 24 hr tablet TAKE 1 TABLET BY MOUTH TWO  TIMES DAILY  . gabapentin (NEURONTIN) 600 MG tablet TAKE 1 AND 1/2 TABLETS BY  MOUTH TWO TIMES DAILY (Patient taking differently: Take 600 mg by mouth 2 (two) times daily. )  . lisinopril (PRINIVIL,ZESTRIL) 5 MG tablet TAKE 1 TABLET BY MOUTH  DAILY  . loratadine (CLARITIN) 10 MG tablet Take 10 mg by mouth daily.   . niacin 500 MG tablet Take by mouth.  . sertraline (ZOLOFT) 100 MG tablet TAKE 1 TABLET BY MOUTH  DAILY  . Specialty Vitamins Products (MAGNESIUM, AMINO ACID CHELATE,) 133 MG tablet Take 1 tablet by mouth 2 (two) times daily.   No current facility-administered medications on file prior to visit.    Review of Systems Per HPI unless specifically indicated above     Objective:    BP 134/78 (BP Location: Left Arm, Patient Position: Sitting, Cuff Size: Normal)   Pulse 78   Ht 5\' 4"  (1.626 m)   Wt 228 lb 3.2 oz (103.5 kg)   BMI 39.17 kg/m   Wt Readings from Last 3 Encounters:  10/30/18 228 lb 3.2 oz (103.5 kg)  11/22/17 226 lb 12.8 oz (102.9 kg)  09/18/17 220 lb (99.8 kg)    Physical Exam Vitals signs and nursing note reviewed.  Constitutional:      General: She is not in acute distress.    Appearance: She is well-developed. She is obese.  HENT:     Head: Normocephalic and atraumatic.     Right Ear: Tympanic membrane, ear canal and external ear normal.     Left Ear: Tympanic membrane, ear canal and external ear normal.     Nose:  Nose normal.     Mouth/Throat:     Mouth: Mucous membranes are moist.     Pharynx: Oropharynx is clear.  Eyes:     Conjunctiva/sclera: Conjunctivae normal.     Pupils: Pupils are equal, round, and reactive to light.  Neck:     Musculoskeletal: Normal range of motion and neck supple.     Thyroid: No  thyromegaly.     Vascular: No JVD.     Trachea: No tracheal deviation.  Cardiovascular:     Rate and Rhythm: Normal rate and regular rhythm.     Pulses: Normal pulses.     Heart sounds: Normal heart sounds. No murmur. No friction rub. No gallop.   Pulmonary:     Effort: Pulmonary effort is normal. No respiratory distress.     Breath sounds: Normal breath sounds.  Abdominal:     General: Bowel sounds are normal. There is no distension.     Palpations: Abdomen is soft. There is no mass.     Tenderness: There is no abdominal tenderness.     Hernia: No hernia is present.  Genitourinary:    Comments: Normal external female genitalia without lesions or fusion. Vaginal canal without lesions. Normal appearing cervix without lesions or friability. Physiologic discharge on exam. Bimanual exam without adnexal masses, enlarged uterus, or cervical motion tenderness.  Rectal - external hemorrhoids noted, currently not inflamed. Musculoskeletal: Normal range of motion.  Lymphadenopathy:     Cervical: No cervical adenopathy.  Skin:    General: Skin is warm and dry.  Neurological:     Mental Status: She is alert and oriented to person, place, and time.     Cranial Nerves: No cranial nerve deficit.  Psychiatric:        Attention and Perception: Attention and perception normal.        Mood and Affect: Affect normal. Mood is anxious.        Speech: Speech is rapid and pressured.        Behavior: Behavior normal.        Thought Content: Thought content normal.        Judgment: Judgment normal.    Results for orders placed or performed in visit on 10/22/18  CBC with Differential/Platelet  Result  Value Ref Range   WBC 7.7 3.4 - 10.8 x10E3/uL   RBC 4.60 3.77 - 5.28 x10E6/uL   Hemoglobin 13.5 11.1 - 15.9 g/dL   Hematocrit 41.8 34.0 - 46.6 %   MCV 91 79 - 97 fL   MCH 29.3 26.6 - 33.0 pg   MCHC 32.3 31.5 - 35.7 g/dL   RDW 13.7 11.7 - 15.4 %   Platelets 278 150 - 450 x10E3/uL   Neutrophils 62 Not Estab. %   Lymphs 24 Not Estab. %   Monocytes 9 Not Estab. %   Eos 4 Not Estab. %   Basos 1 Not Estab. %   Neutrophils Absolute 4.8 1.4 - 7.0 x10E3/uL   Lymphocytes Absolute 1.8 0.7 - 3.1 x10E3/uL   Monocytes Absolute 0.7 0.1 - 0.9 x10E3/uL   EOS (ABSOLUTE) 0.3 0.0 - 0.4 x10E3/uL   Basophils Absolute 0.0 0.0 - 0.2 x10E3/uL   Immature Granulocytes 0 Not Estab. %   Immature Grans (Abs) 0.0 0.0 - 0.1 x10E3/uL  Comprehensive metabolic panel  Result Value Ref Range   Glucose 102 (H) 65 - 99 mg/dL   BUN 19 6 - 24 mg/dL   Creatinine, Ser 1.30 (H) 0.57 - 1.00 mg/dL   GFR calc non Af Amer 45 (L) >59 mL/min/1.73   GFR calc Af Amer 52 (L) >59 mL/min/1.73   BUN/Creatinine Ratio 15 9 - 23   Sodium 139 134 - 144 mmol/L   Potassium 4.8 3.5 - 5.2 mmol/L   Chloride 102 96 - 106 mmol/L   CO2 25 20 - 29 mmol/L   Calcium 9.8 8.7 - 10.2 mg/dL  Total Protein 6.4 6.0 - 8.5 g/dL   Albumin 4.3 3.8 - 4.9 g/dL   Globulin, Total 2.1 1.5 - 4.5 g/dL   Albumin/Globulin Ratio 2.0 1.2 - 2.2   Bilirubin Total 0.5 0.0 - 1.2 mg/dL   Alkaline Phosphatase 92 39 - 117 IU/L   AST 20 0 - 40 IU/L   ALT 24 0 - 32 IU/L  Hemoglobin A1c  Result Value Ref Range   Hgb A1c MFr Bld 5.7 (H) 4.8 - 5.6 %   Est. average glucose Bld gHb Est-mCnc 117 mg/dL  Lipid panel  Result Value Ref Range   Cholesterol, Total 203 (H) 100 - 199 mg/dL   Triglycerides 154 (H) 0 - 149 mg/dL   HDL 58 >39 mg/dL   VLDL Cholesterol Cal 27 5 - 40 mg/dL   LDL Chol Calc (NIH) 118 (H) 0 - 99 mg/dL   Chol/HDL Ratio 3.5 0.0 - 4.4 ratio  TSH  Result Value Ref Range   TSH 4.370 0.450 - 4.500 uIU/mL      Assessment & Plan:   Problem List Items  Addressed This Visit    None    Visit Diagnoses    Encounter for annual physical exam    -  Primary   Cervical cancer screening       Relevant Orders   Pap liquid-based and HPV (high risk)   Mixed dyslipidemia       Relevant Medications   Icosapent Ethyl (VASCEPA) 1 g CAPS   Postmenopausal estrogen deficiency       Relevant Orders   DG Bone Density   CKD (chronic kidney disease) stage 3, GFR 30-59 ml/min       Relevant Orders   Basic Metabolic Panel (BMET)   Needs flu shot       Relevant Orders   Flu Vaccine QUAD 6+ mos PF IM (Fluarix Quad PF) (Completed)      Annual physical exam with abnormal lab findings for cholesterol and kidney function  Plan: 1. Obtain health maintenance screenings as above according to age. - Increase physical activity to 30 minutes most days of the week.  - Eat healthy diet high in vegetables and fruits; low in refined carbohydrates. - Screening labs and tests as ordered 2. Renal impairment likely due to drug.  Encouraged reduction of etodolac, consideration of changing to diclofenac.  Patient defers to Dr. Raliegh Ip.  Recommend repeat kidney function in 1 month to confirm testing not related to dehydration.  May need nephrology referral soon.  Optimize renal function and prevent progression.   3. Cholesterol management not ideal.  Limited by intolerance to statin.  START fish oil with Vascepa 2 g bid for hypertriglyceridemia. 4. Return 1 year for annual physical with Dr Raliegh Ip.    Meds ordered this encounter  Medications  . Icosapent Ethyl (VASCEPA) 1 g CAPS    Sig: Take 2 capsules (2 g total) by mouth 2 (two) times daily.    Dispense:  120 capsule    Refill:  3    Order Specific Question:   Supervising Provider    Answer:   Olin Hauser [[id number]]     Follow up plan: Return in about 1 year (around 10/30/2019) for annual physical.  Cassell Smiles, DNP, AGPCNP-BC Adult Gerontology Primary Care Nurse Practitioner [facility] 10/30/2018, 9:11 AM

## 2018-10-30 NOTE — Patient Instructions (Addendum)
[name],   Thank you for coming in to clinic today.  1. Recheck labs for kidney function 1 month. - Readdress etodolac with Dr. Raliegh Ip at that time.  Encourage you to skip one daily dose as often as possible.  2. Can start vascepa $9 for 90 days with copay card.  This is pure EPA for fish oil.  Take 2 caps twice daily.  3. Your bone density scan order has been placed.  Call the Scheduling phone number at [phone] to schedule your test at your convenience.  You can choose to go to either location listed below.  Let the scheduler know which location you prefer.  [place]  [place], [place] [address]   [facility] [address] [place], [place] [address]   Please schedule a follow-up appointment. Return in about 1 year (around 10/30/2019) for annual physical.  If you have any other questions or concerns, please feel free to call the clinic or send a message through [facility]. You may also schedule an earlier appointment if necessary.  You will receive a survey after today's visit either digitally by e-mail or paper by [employer]. Your experiences and feedback matter to [place].  Please respond so we know how we are doing as we provide care for you.  Cassell Smiles, DNP, AGNP-BC Adult Gerontology Nurse Practitioner [facility], [facility]  Influenza (Flu) Vaccine (Inactivated or Recombinant): What You Need to Know 1. Why get vaccinated? Influenza vaccine can prevent influenza (flu). Flu is a contagious disease that spreads around the [place] every year, usually between October and May. Anyone can get the flu, but it is more dangerous for some people. Infants and young children, people 25 years of age and older, pregnant women, and people with certain health conditions or a weakened immune system are at greatest risk of flu complications. Pneumonia, bronchitis,  sinus infections and ear infections are examples of flu-related complications. If you have a medical condition, such as heart disease, cancer or diabetes, flu can make it worse. Flu can cause fever and chills, sore throat, muscle aches, fatigue, cough, headache, and runny or stuffy nose. Some people may have vomiting and diarrhea, though this is more common in children than adults. Each year thousands of people in the [place] die from flu, and many more are hospitalized. Flu vaccine prevents millions of illnesses and flu-related visits to the doctor each year. 2. Influenza vaccine CDC recommends everyone 14 months of age and older get vaccinated every flu season. Children 6 months through 25 years of age may need 2 doses during a single flu season. Everyone else needs only 1 dose each flu season. It takes about 2 weeks for protection to develop after vaccination. There are many flu viruses, and they are always changing. Each year a new flu vaccine is made to protect against three or four viruses that are likely to cause disease in the upcoming flu season. Even when the vaccine doesn't exactly match these viruses, it may still provide some protection. Influenza vaccine does not cause flu. Influenza vaccine may be given at the same time as other vaccines. 3. Talk with your health care provider Tell your vaccine provider if the person getting the vaccine:  Has had an allergic reaction after a previous dose of influenza vaccine, or has any severe, life-threatening allergies.  Has ever had Guillain-Barr Syndrome (also called GBS). In some  cases, your health care provider may decide to postpone influenza vaccination to a future visit. People with minor illnesses, such as a cold, may be vaccinated. People who are moderately or severely ill should usually wait until they recover before getting influenza vaccine. Your health care provider can give you more information. 4. Risks of a vaccine  reaction  Soreness, redness, and swelling where shot is given, fever, muscle aches, and headache can happen after influenza vaccine.  There may be a very small increased risk of Guillain-Barr Syndrome (GBS) after inactivated influenza vaccine (the flu shot). Young children who get the flu shot along with pneumococcal vaccine (PCV13), and/or DTaP vaccine at the same time might be slightly more likely to have a seizure caused by fever. Tell your health care provider if a child who is getting flu vaccine has ever had a seizure. People sometimes faint after medical procedures, including vaccination. Tell your provider if you feel dizzy or have vision changes or ringing in the ears. As with any medicine, there is a very remote chance of a vaccine causing a severe allergic reaction, other serious injury, or death. 5. What if there is a serious problem? An allergic reaction could occur after the vaccinated person leaves the clinic. If you see signs of a severe allergic reaction (hives, swelling of the face and throat, difficulty breathing, a fast heartbeat, dizziness, or weakness), call 9-1-1 and get the person to the nearest hospital. For other signs that concern you, call your health care provider. Adverse reactions should be reported to the Vaccine Adverse Event Reporting System (VAERS). Your health care provider will usually file this report, or you can do it yourself. Visit the VAERS website at www.vaers.SamedayNews.es or call 772-716-4314.VAERS is only for reporting reactions, and VAERS staff do not give medical advice. 6. The National Vaccine Injury Compensation Program The [employer] Vaccine Injury Compensation Program (VICP) is a federal program that was created to compensate people who may have been injured by certain vaccines. Visit the VICP website at GoldCloset.com.ee or call (503) 216-3277 to learn about the program and about filing a claim. There is a time limit to file a claim for  compensation. 7. How can I learn more?  Ask your healthcare provider.  Call your local or state health department.  Contact the Centers for Disease Control and Prevention (CDC): ? Call (818)720-2671 (1-800-CDC-INFO) or ? Visit CDC's https://gibson.com/ Vaccine Information Statement (Interim) Inactivated Influenza Vaccine (09/14/2017) This information is not intended to replace advice given to you by your health care provider. Make sure you discuss any questions you have with your health care provider. Document Released: 11/11/2005 Document Revised: 05/08/2018 Document Reviewed: 09/18/2017 Elsevier Patient Education  2020 [employer].

## 2018-10-31 ENCOUNTER — Encounter: Payer: Self-pay | Admitting: Nurse Practitioner

## 2018-11-10 LAB — PAP LB AND HPV HIGH-RISK: HPV, high-risk: NEGATIVE

## 2018-11-21 ENCOUNTER — Telehealth: Payer: Self-pay | Admitting: Family Medicine

## 2018-11-21 DIAGNOSIS — R7989 Other specified abnormal findings of blood chemistry: Secondary | ICD-10-CM

## 2018-11-21 NOTE — Telephone Encounter (Signed)
Patient advised.

## 2018-11-21 NOTE — Telephone Encounter (Signed)
Please notify patient that it has been 1 month since her previous labs 10/2018. Based on result with some mild reduced kidney function, she will be due to repeat her Chemistry Panel - I have placed order for [facility] - she may go at anytime to get that blood test done and then we can share results with her.  She should try her best to stay extra well hydrated prior to getting the blood draw, for at least 1-2 days and the day of the test.  [name], [facility] 11/21/2018, 9:06 AM

## 2018-11-24 LAB — BASIC METABOLIC PANEL
BUN/Creatinine Ratio: 12 (ref 9–23)
BUN: 15 mg/dL (ref 6–24)
CO2: 20 mmol/L (ref 20–29)
Calcium: 9.5 mg/dL (ref 8.7–10.2)
Chloride: 102 mmol/L (ref 96–106)
Creatinine, Ser: 1.28 mg/dL — ABNORMAL HIGH (ref 0.57–1.00)
GFR calc Af Amer: 53 mL/min/{1.73_m2} — ABNORMAL LOW (ref >59)
GFR calc non Af Amer: 46 mL/min/{1.73_m2} — ABNORMAL LOW (ref >59)
Glucose: 115 mg/dL — ABNORMAL HIGH (ref 65–99)
Potassium: 4.3 mmol/L (ref 3.5–5.2)
Sodium: 137 mmol/L (ref 134–144)

## 2018-11-27 ENCOUNTER — Ambulatory Visit
Admission: RE | Admit: 2018-11-27 | Discharge: 2018-11-27 | Disposition: A | Discharge status: Home / Self Care | Payer: Managed Care, Other (non HMO) | Source: Ambulatory Visit | Attending: Family Medicine | Admitting: Family Medicine

## 2018-11-27 ENCOUNTER — Ambulatory Visit
Admission: RE | Admit: 2018-11-27 | Discharge: 2018-11-27 | Disposition: A | Discharge status: Home / Self Care | Payer: Managed Care, Other (non HMO) | Source: Ambulatory Visit | Attending: Nurse Practitioner | Admitting: Nurse Practitioner

## 2018-11-27 ENCOUNTER — Other Ambulatory Visit: Payer: Self-pay

## 2018-11-27 DIAGNOSIS — Z780 Asymptomatic menopausal state: Secondary | ICD-10-CM | POA: Insufficient documentation

## 2018-11-27 DIAGNOSIS — Z1231 Encounter for screening mammogram for malignant neoplasm of breast: Secondary | ICD-10-CM | POA: Diagnosis present

## 2018-11-28 ENCOUNTER — Other Ambulatory Visit: Payer: Self-pay | Admitting: Family Medicine

## 2018-11-28 DIAGNOSIS — R928 Other abnormal and inconclusive findings on diagnostic imaging of breast: Secondary | ICD-10-CM

## 2018-11-28 DIAGNOSIS — N6489 Other specified disorders of breast: Secondary | ICD-10-CM

## 2018-11-29 ENCOUNTER — Telehealth: Payer: Self-pay | Admitting: Family Medicine

## 2018-11-29 DIAGNOSIS — N1831 Chronic kidney disease, stage 3a: Secondary | ICD-10-CM

## 2018-11-29 NOTE — Telephone Encounter (Signed)
Called patient. DEXA normal, not osteoporotic or osteopenia. Recheck >5+ years if need.  BMET - still reduced Creatinine 1.28 from 1.3, advised she can HOLD Lisinopril 5mg  for 1 month then restart. Improve hydration, keep on reduced Etolodac now 1 a day instead of 2.  Repeat BMET [facility] in 4-6 months, she can notify when ready.  [name], DO [facility] 11/29/2018, 2:29 PM

## 2018-11-29 NOTE — Telephone Encounter (Signed)
Pt called requesting lab results

## 2018-11-29 NOTE — Addendum Note (Signed)
Addended by: Olin Hauser on: 11/29/2018 02:33 PM   Modules accepted: Orders

## 2018-12-07 ENCOUNTER — Ambulatory Visit
Admission: RE | Admit: 2018-12-07 | Discharge: 2018-12-07 | Disposition: A | Discharge status: Home / Self Care | Payer: Managed Care, Other (non HMO) | Source: Ambulatory Visit | Attending: Family Medicine | Admitting: Family Medicine

## 2018-12-07 DIAGNOSIS — N6489 Other specified disorders of breast: Secondary | ICD-10-CM | POA: Diagnosis present

## 2018-12-07 DIAGNOSIS — R928 Other abnormal and inconclusive findings on diagnostic imaging of breast: Secondary | ICD-10-CM | POA: Diagnosis not present

## 2018-12-30 ENCOUNTER — Other Ambulatory Visit: Payer: Self-pay | Admitting: Family Medicine

## 2018-12-30 DIAGNOSIS — E782 Mixed hyperlipidemia: Secondary | ICD-10-CM

## 2018-12-30 DIAGNOSIS — M1990 Unspecified osteoarthritis, unspecified site: Secondary | ICD-10-CM

## 2019-01-17 ENCOUNTER — Other Ambulatory Visit: Payer: Self-pay | Admitting: Family Medicine

## 2019-01-17 DIAGNOSIS — F331 Major depressive disorder, recurrent, moderate: Secondary | ICD-10-CM

## 2019-01-17 DIAGNOSIS — F5105 Insomnia due to other mental disorder: Secondary | ICD-10-CM

## 2019-01-17 DIAGNOSIS — I10 Essential (primary) hypertension: Secondary | ICD-10-CM

## 2019-01-21 ENCOUNTER — Ambulatory Visit
Admission: EM | Admit: 2019-01-21 | Discharge: 2019-01-21 | Disposition: A | Discharge status: Home / Self Care | Payer: Managed Care, Other (non HMO) | Attending: Family Medicine | Admitting: Family Medicine

## 2019-01-21 ENCOUNTER — Other Ambulatory Visit: Payer: Self-pay

## 2019-01-21 DIAGNOSIS — S6991XA Unspecified injury of right wrist, hand and finger(s), initial encounter: Secondary | ICD-10-CM

## 2019-01-21 DIAGNOSIS — W19XXXA Unspecified fall, initial encounter: Secondary | ICD-10-CM

## 2019-01-21 DIAGNOSIS — S0990XA Unspecified injury of head, initial encounter: Secondary | ICD-10-CM

## 2019-01-21 DIAGNOSIS — T148XXA Other injury of unspecified body region, initial encounter: Secondary | ICD-10-CM | POA: Diagnosis not present

## 2019-01-21 DIAGNOSIS — S6992XA Unspecified injury of left wrist, hand and finger(s), initial encounter: Secondary | ICD-10-CM

## 2019-01-21 MED ORDER — KETOROLAC TROMETHAMINE 10 MG PO TABS: 10.0000 mg | ORAL_TABLET | Freq: Four times a day (QID) | ORAL | 0 refills | Status: DC | PRN

## 2019-01-21 NOTE — Discharge Instructions (Signed)
Rest.  Medication as needed.  Take it easy.  Dr. Lacinda Axon

## 2019-01-21 NOTE — ED Provider Notes (Signed)
[facility]    CSN: [id number] Arrival date & time: 01/21/19  1427      History   Chief Complaint Chief Complaint  Patient presents with  . Fall   HPI  58 year old female presents following a fall.  Patient states that approximately 1.5 hours ago she was helping her children move.  She fell out of the back and of a moving truck.  She landed on asphalt.  In doing so, she hyperextended both wrists, hit the right frontal region of her head, and also injured her right knee.  Denies loss of consciousness.  Denies vision changes.  Patient reports bilateral wrist pain & elbow pain.  She has abrasions noted to the right hand and wrist as well as the right knee.  She also has an abrasion to the right frontal region.  She rates her pain is 8/10 in severity.  No medications or interventions tried.  She was encouraged to come in for evaluation by her husband.  No relieving factors.  No other complaints or concerns at this time.  PMH, Surgical Hx, Family Hx, Social History reviewed and updated as below.  Past Medical History:  Diagnosis Date  . Allergy   . Fibromyalgia   . Hypertension   . Wrist pain    Patient Active Problem List   Diagnosis Date Noted  . Chronic kidney disease (CKD), stage III (moderate) 10/30/2018  . De Quervain's tenosynovitis, right 08/08/2016  . Hyperlipidemia 05/24/2016  . OSA on CPAP 05/04/2016  . Chronic fatigue 08/17/2015  . Sciatica 05/14/2015  . Morbid obesity ([place]) 03/02/2015  . Fibromyalgia 07/16/2014  . Major depression, recurrent, chronic ([place]) 07/16/2014  . Insomnia 07/16/2014  . Benign hypertension with CKD (chronic kidney disease) stage III 07/16/2014    Past Surgical History:  Procedure Laterality Date  . BREAST BIOPSY Left    core- neg  . BREAST BIOPSY Left    stereo- neg  . ECTOPIC PREGNANCY SURGERY    . FOOT SURGERY      OB History   No obstetric history on file.      Home Medications    Prior to Admission  medications   Medication Sig Start Date End Date Taking? Authorizing Provider  Acetaminophen (TYLENOL ARTHRITIS PAIN PO) Take by mouth.    [provider]  atorvastatin (LIPITOR) 20 MG tablet TAKE 1 TABLET BY MOUTH  DAILY 12/30/18   Olin Hauser, DO  b complex vitamins tablet Take 1 tablet by mouth daily.    [provider]  buPROPion (WELLBUTRIN XL) 300 MG 24 hr tablet TAKE 1 TABLET BY MOUTH  DAILY 01/17/19   Parks Ranger, Devonne Doughty, DO  Calcium-Vitamin D 600-200 MG-UNIT tablet Take 1 tablet by mouth 2 (two) times daily.     [provider]  Cholecalciferol (VITAMIN D-1000 MAX ST) 1000 units tablet Take 1,000 Units by mouth daily.    [provider]  co-enzyme Q-10 50 MG capsule Take 100 mg by mouth daily.    [provider]  cyclobenzaprine (FLEXERIL) 10 MG tablet TAKE 1 TABLET BY MOUTH AT  BEDTIME Patient taking differently: Take 10 mg by mouth at bedtime as needed.  11/26/17   Karamalegos, Devonne Doughty, DO  gabapentin (NEURONTIN) 600 MG tablet TAKE 1 AND 1/2 TABLETS BY  MOUTH TWO TIMES DAILY Patient taking differently: Take 600 mg by mouth 2 (two) times daily.  09/27/18   Karamalegos, Devonne Doughty, DO  ketorolac (TORADOL) 10 MG tablet Take 1 tablet (10 mg  total) by mouth every 6 (six) hours as needed for moderate pain or severe pain. 01/21/19   Coral Spikes, DO  lisinopril (ZESTRIL) 5 MG tablet TAKE 1 TABLET BY MOUTH  DAILY 01/17/19   Parks Ranger, Devonne Doughty, DO  loratadine (CLARITIN) 10 MG tablet Take 10 mg by mouth daily.     [provider]  niacin 500 MG tablet Take by mouth.    [provider]  sertraline (ZOLOFT) 100 MG tablet TAKE 1 TABLET BY MOUTH  DAILY 01/17/19   Olin Hauser, DO  Specialty Vitamins Products (MAGNESIUM, AMINO ACID CHELATE,) 133 MG tablet Take 1 tablet by mouth 2 (two) times daily.    [provider]  Icosapent Ethyl (VASCEPA) 1 g CAPS Take 2 capsules (2 g total) by mouth 2  (two) times daily. 10/30/18 01/21/19  Mikey College, NP    Family History Family History  Problem Relation Age of Onset  . Hypertension Mother   . Diabetes Mother   . Hyperlipidemia Mother   . Cancer Mother   . Breast cancer Mother 96  . Hypertension Father   . Hyperlipidemia Father     Social History Social History   Tobacco Use  . Smoking status: Former Smoker    Quit date: 11/23/1984    Years since quitting: 34.1  . Smokeless tobacco: Former Network engineer Use Topics  . Alcohol use: Yes    Alcohol/week: 10.0 standard drinks    Types: 10 Glasses of wine per week    Comment: weekly  . Drug use: No     Allergies   Patient has no known allergies.   Review of Systems Review of Systems  Constitutional: Negative.   Musculoskeletal:       Wrist pain, elbow pain, knee pain.  Skin:       Abrasions.   Physical Exam Triage Vital Signs ED Triage Vitals  Enc Vitals Group     BP 01/21/19 1449 136/77     Pulse Rate 01/21/19 1449 78     Resp 01/21/19 1449 16     Temp 01/21/19 1449 98 F (36.7 C)     Temp Source 01/21/19 1449 Oral     SpO2 01/21/19 1449 99 %     Weight 01/21/19 1448 230 lb (104.3 kg)     Height 01/21/19 1448 5\' 3"  (1.6 m)     Head Circumference --      Peak Flow --      Pain Score 01/21/19 1447 8     Pain Loc --      Pain Edu? --      Excl. in [place]? --    Updated Vital Signs BP 136/77 (BP Location: Left Arm)   Pulse 78   Temp 98 F (36.7 C) (Oral)   Resp 16   Ht 5\' 3"  (1.6 m)   Wt 104.3 kg   SpO2 99%   BMI 40.74 kg/m   Visual Acuity Right Eye Distance:   Left Eye Distance:   Bilateral Distance:    Right Eye Near:   Left Eye Near:    Bilateral Near:     Physical Exam Vitals and nursing note reviewed.  Constitutional:      General: She is not in acute distress.    Appearance: Normal appearance. She is not ill-appearing.  HENT:     Head: Normocephalic and atraumatic.  Eyes:     General:        Right eye: No discharge.  Left eye: No discharge.     Conjunctiva/sclera: Conjunctivae normal.  Cardiovascular:     Rate and Rhythm: Normal rate and regular rhythm.     Heart sounds: No murmur.  Pulmonary:     Effort: Pulmonary effort is normal.     Breath sounds: Normal breath sounds. No wheezing, rhonchi or rales.  Musculoskeletal:     Comments: Left wrist, right wrist, right elbow, and left elbow with no discrete areas of bony tenderness.  Skin:    Comments: Small superficial abrasions noted on the palmar aspect of the right hand particularly around the thenar eminence and hyperthenar eminence.  Abrasion noted at the base of the fifth digit of the right hand.  Patient also has an abrasion to the dorsal aspect of the distal right wrist.  Abrasion noted to the right frontal region.  Abrasion noted to the right anterior knee.  Neurological:     General: No focal deficit present.     Mental Status: She is alert and oriented to person, place, and time.     Cranial Nerves: No cranial nerve deficit.     Motor: No weakness.  Psychiatric:        Mood and Affect: Mood normal.        Behavior: Behavior normal.    UC Treatments / Results  Labs (all labs ordered are listed, but only abnormal results are displayed) Labs Reviewed - No data to display  EKG   Radiology No results found.  Procedures Procedures (including critical care time)  Medications Ordered in UC Medications - No data to display  Initial Impression / Assessment and Plan / UC Course  I have reviewed the triage vital signs and the nursing notes.  Pertinent labs & imaging results that were available during my care of the patient were reviewed by me and considered in my medical decision making (see chart for details).    58 year old female presents with superficial abrasions and musculoskeletal pain after suffering a fall.  Neurologically intact.  No discrete bony tenderness on exam.  No indications for imaging.  Toradol as needed for  pain.  Supportive care.  Final Clinical Impressions(s) / UC Diagnoses   Final diagnoses:  Injury of left wrist, initial encounter  Injury of right wrist, initial encounter  Abrasion  Minor head injury, initial encounter     Discharge Instructions     Rest.  Medication as needed.  Take it easy.  Dr. Lacinda Axon    ED Prescriptions    Medication Sig Dispense Auth. Provider   ketorolac (TORADOL) 10 MG tablet Take 1 tablet (10 mg total) by mouth every 6 (six) hours as needed for moderate pain or severe pain. 20 tablet Coral Spikes, DO     PDMP not reviewed this encounter.   Coral Spikes, [place] 01/21/19 1519

## 2019-01-21 NOTE — ED Triage Notes (Addendum)
Pt states she was helping her kids unload a tractor trailer and she fell out of the back end. [address] with her hands bracing her and she hit the side of her head on the concrete. Bilateral wrist pain, right inner elbow pain, right knee pain, head pain. No LOC

## 2019-02-15 ENCOUNTER — Other Ambulatory Visit
Admission: RE | Admit: 2019-02-15 | Discharge: 2019-02-15 | Disposition: A | Discharge status: Home / Self Care | Payer: Managed Care, Other (non HMO) | Source: Ambulatory Visit | Attending: Specialist | Admitting: Specialist

## 2019-02-15 ENCOUNTER — Other Ambulatory Visit: Payer: Self-pay

## 2019-02-15 DIAGNOSIS — Z01812 Encounter for preprocedural laboratory examination: Secondary | ICD-10-CM | POA: Insufficient documentation

## 2019-02-15 DIAGNOSIS — Z20822 Contact with and (suspected) exposure to covid-19: Secondary | ICD-10-CM | POA: Diagnosis not present

## 2019-02-16 LAB — SARS CORONAVIRUS 2 (TAT 6-24 HRS): SARS Coronavirus 2: NEGATIVE

## 2019-02-17 ENCOUNTER — Other Ambulatory Visit: Payer: Self-pay | Admitting: Specialist

## 2019-02-17 NOTE — Progress Notes (Unsigned)
PREOPERATIVE H&P  Chief Complaint: Left distal radius fracture with displacement  HPI: Rachel Townsend is a 59 y.o. female who presents for preoperative history and physical with a diagnosis of displaced left distal radius fracture. The joint is subluxed and needs to be restored. . Symptoms are rated as moderate to severe, and have been worsening.  This is significantly impairing activities of daily living.  She has elected for surgical management.   Past Medical History:  Diagnosis Date  . Allergy   . Fibromyalgia   . Hypertension   . Wrist pain    Past Surgical History:  Procedure Laterality Date  . BREAST BIOPSY Left    core- neg  . BREAST BIOPSY Left    stereo- neg  . ECTOPIC PREGNANCY SURGERY    . FOOT SURGERY     Social History   Socioeconomic History  . Marital status: Married    Spouse name: Not on file  . Number of children: Not on file  . Years of education: Not on file  . Highest education level: Not on file  Occupational History  . 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 Smoking status: Former Smoker    Quit date: 11/23/1984    Years since quitting: 34.2  . Smokeless tobacco: Former Network engineer and Sexual Activity  . Alcohol use: Yes    Alcohol/week: 10.0 standard drinks    Types: 10 Glasses of wine per week    Comment: weekly  . Drug use: No  . Sexual activity: Yes    Birth control/protection: Injection  Other Topics Concern  . Not on file  Social History Narrative  . Not on file   Social Determinants of Health   Financial Resource Strain:   . Difficulty of Paying Living Expenses: Not on file  Food Insecurity:   . Worried About Charity fundraiser in the Last Year: Not on file  . Ran Out of Food in the Last Year: Not on file  Transportation Needs:   . Lack of Transportation (Medical): Not on file  . Lack of Transportation (Non-Medical): Not on file  Physical Activity:   . Days of Exercise per Week: Not on file  . Minutes of  Exercise per Session: Not on file  Stress:   . Feeling of Stress : Not on file  Social Connections:   . Frequency of Communication with Friends and Family: Not on file  . Frequency of Social Gatherings with Friends and Family: Not on file  . Attends Religious Services: Not on file  . Active Member of Clubs or Organizations: Not on file  . Attends Archivist Meetings: Not on file  . Marital Status: Not on file   Family History  Problem Relation Age of Onset  . Hypertension Mother   . Diabetes Mother   . Hyperlipidemia Mother   . Cancer Mother   . Breast cancer Mother 81  . Hypertension Father   . Hyperlipidemia Father    No Known Allergies Prior to Admission medications   Medication Sig Start Date End Date Taking? Authorizing Provider  atorvastatin (LIPITOR) 20 MG tablet TAKE 1 TABLET BY MOUTH  DAILY Patient taking differently: Take 20 mg by mouth daily.  12/30/18   Olin Hauser, DO  b complex vitamins tablet Take 1 tablet by mouth daily.    [provider]  buPROPion (WELLBUTRIN XL) 300 MG 24 hr tablet TAKE 1 TABLET BY MOUTH  DAILY Patient taking differently: Take 300  mg by mouth daily.  01/17/19   Karamalegos, Devonne Doughty, DO  Calcium-Vitamin D 600-200 MG-UNIT tablet Take 1 tablet by mouth 2 (two) times daily.     [provider]  Cholecalciferol (VITAMIN D-1000 MAX ST) 1000 units tablet Take 1,000 Units by mouth daily.    [provider]  Co-Enzyme Q-10 100 MG CAPS Take 100 mg by mouth daily.     [provider]  cyclobenzaprine (FLEXERIL) 10 MG tablet TAKE 1 TABLET BY MOUTH AT  BEDTIME Patient taking differently: Take 10 mg by mouth at bedtime.  11/26/17   Karamalegos, Devonne Doughty, DO  etodolac (LODINE) 500 MG tablet Take 500 mg by mouth daily.    [provider]  gabapentin (NEURONTIN) 600 MG tablet TAKE 1 AND 1/2 TABLETS BY  MOUTH TWO TIMES DAILY Patient taking differently: Take 600 mg by mouth 2 (two) times  daily.  09/27/18   Karamalegos, Devonne Doughty, DO  ketorolac (TORADOL) 10 MG tablet Take 1 tablet (10 mg total) by mouth every 6 (six) hours as needed for moderate pain or severe pain. Patient not taking: Reported on 02/15/2019 01/21/19   Coral Spikes, DO  lisinopril (ZESTRIL) 5 MG tablet TAKE 1 TABLET BY MOUTH  DAILY Patient taking differently: Take 5 mg by mouth daily.  01/17/19   Karamalegos, Devonne Doughty, DO  loratadine (CLARITIN) 10 MG tablet Take 10 mg by mouth daily.     [provider]  Magnesium (MAGNACAPS) 100 MG CAPS Take 200 mg by mouth 2 (two) times daily.    [provider]  niacin 500 MG tablet Take 500 mg by mouth daily.     [provider]  sertraline (ZOLOFT) 100 MG tablet TAKE 1 TABLET BY MOUTH  DAILY Patient taking differently: Take 100 mg by mouth daily.  01/17/19   Karamalegos, Devonne Doughty, DO  Icosapent Ethyl (VASCEPA) 1 g CAPS Take 2 capsules (2 g total) by mouth 2 (two) times daily. 10/30/18 01/21/19  Mikey College, NP     Positive ROS: All other systems have been reviewed and were otherwise negative with the exception of those mentioned in the HPI and as above.  Physical Exam: General: Alert, no acute distress Cardiovascular: No pedal edema. Heart is regular and without murmur.  Respiratory: No cyanosis, no use of accessory musculature. Lungs are clear. GI: No organomegaly, abdomen is soft and non-tender Skin: No lesions in the area of chief complaint Neurologic: Sensation intact distally Psychiatric: Patient is competent for consent with normal mood and affect Lymphatic: No axillary or cervical lymphadenopathy  MUSCULOSKELETAL: Left distal radius is tender with fairly good motion. Pain at fracture site. Skin intact.  CSM good distally.   Assessment:  Displaced left distal radius fracture  Plan: Plan for open reduction internal fixation left distal radius  The risks benefits and alternatives were discussed with the patient  including but not limited to the risks of nonoperative treatment, versus surgical intervention including infection, bleeding, nerve injury,  blood clots, cardiopulmonary complications, morbidity, mortality, among others, and they were willing to proceed.   Park Breed, MD [phone]   02/17/2019 8:56 PM

## 2019-02-18 ENCOUNTER — Other Ambulatory Visit: Payer: Self-pay | Admitting: Specialist

## 2019-02-18 ENCOUNTER — Ambulatory Visit: Payer: Managed Care, Other (non HMO) | Admitting: Certified Registered Nurse Anesthetist

## 2019-02-18 ENCOUNTER — Encounter
Admission: RE | Disposition: A | Discharge status: Home / Self Care | Payer: Self-pay | Source: Home / Self Care | Attending: Specialist

## 2019-02-18 ENCOUNTER — Ambulatory Visit
Admission: RE | Admit: 2019-02-18 | Discharge: 2019-02-18 | Disposition: A | Discharge status: Home / Self Care | Payer: Managed Care, Other (non HMO) | Attending: Specialist | Admitting: Specialist

## 2019-02-18 ENCOUNTER — Other Ambulatory Visit: Payer: Self-pay

## 2019-02-18 ENCOUNTER — Encounter: Payer: Self-pay | Admitting: Specialist

## 2019-02-18 DIAGNOSIS — S52571A Other intraarticular fracture of lower end of right radius, initial encounter for closed fracture: Secondary | ICD-10-CM | POA: Diagnosis present

## 2019-02-18 DIAGNOSIS — M797 Fibromyalgia: Secondary | ICD-10-CM | POA: Diagnosis not present

## 2019-02-18 DIAGNOSIS — Z87891 Personal history of nicotine dependence: Secondary | ICD-10-CM | POA: Diagnosis not present

## 2019-02-18 DIAGNOSIS — G4730 Sleep apnea, unspecified: Secondary | ICD-10-CM | POA: Insufficient documentation

## 2019-02-18 DIAGNOSIS — I10 Essential (primary) hypertension: Secondary | ICD-10-CM | POA: Insufficient documentation

## 2019-02-18 DIAGNOSIS — X58XXXA Exposure to other specified factors, initial encounter: Secondary | ICD-10-CM | POA: Insufficient documentation

## 2019-02-18 DIAGNOSIS — Z79899 Other long term (current) drug therapy: Secondary | ICD-10-CM | POA: Diagnosis not present

## 2019-02-18 HISTORY — PX: ORIF RADIAL FRACTURE: SHX5113

## 2019-02-18 SURGERY — OPEN REDUCTION INTERNAL FIXATION (ORIF) RADIAL FRACTURE
Anesthesia: General | Site: Wrist | Laterality: Right

## 2019-02-18 MED ORDER — FAMOTIDINE 20 MG PO TABS: 20.0000 mg | ORAL_TABLET | Freq: Once | ORAL | Status: AC

## 2019-02-18 MED ORDER — ONDANSETRON HCL 4 MG/2ML IJ SOLN
INTRAMUSCULAR | Status: DC | PRN
  Administered 2019-02-18: 4 mg via INTRAVENOUS

## 2019-02-18 MED ORDER — CEFAZOLIN SODIUM-DEXTROSE 2-4 GM/100ML-% IV SOLN
INTRAVENOUS | Status: AC
  Filled 2019-02-18: qty 100

## 2019-02-18 MED ORDER — ONDANSETRON HCL 4 MG/2ML IJ SOLN
INTRAMUSCULAR | Status: AC
  Filled 2019-02-18: qty 2

## 2019-02-18 MED ORDER — NEOMYCIN-POLYMYXIN B GU 40-200000 IR SOLN
Status: DC | PRN
  Administered 2019-02-18: 2 mL

## 2019-02-18 MED ORDER — SODIUM CHLORIDE FLUSH 0.9 % IV SOLN
INTRAVENOUS | Status: AC
  Filled 2019-02-18: qty 10

## 2019-02-18 MED ORDER — ACETAMINOPHEN 10 MG/ML IV SOLN
INTRAVENOUS | Status: DC | PRN
  Administered 2019-02-18: 1000 mg via INTRAVENOUS

## 2019-02-18 MED ORDER — CLINDAMYCIN PHOSPHATE 600 MG/50ML IV SOLN: 600.0000 mg | INTRAVENOUS | Status: DC

## 2019-02-18 MED ORDER — PROPOFOL 10 MG/ML IV BOLUS
INTRAVENOUS | Status: DC | PRN
  Administered 2019-02-18: 200 mg via INTRAVENOUS

## 2019-02-18 MED ORDER — MELOXICAM 7.5 MG PO TABS
ORAL_TABLET | ORAL | Status: AC
  Administered 2019-02-18: 15 mg via ORAL
  Filled 2019-02-18: qty 2

## 2019-02-18 MED ORDER — PHENYLEPHRINE HCL (PRESSORS) 10 MG/ML IV SOLN
INTRAVENOUS | Status: DC | PRN
  Administered 2019-02-18: 100 ug via INTRAVENOUS

## 2019-02-18 MED ORDER — ACETAMINOPHEN 10 MG/ML IV SOLN
INTRAVENOUS | Status: AC
  Filled 2019-02-18: qty 100

## 2019-02-18 MED ORDER — FENTANYL CITRATE (PF) 100 MCG/2ML IJ SOLN
INTRAMUSCULAR | Status: AC
  Filled 2019-02-18: qty 2

## 2019-02-18 MED ORDER — MIDAZOLAM HCL 2 MG/2ML IJ SOLN
INTRAMUSCULAR | Status: AC
  Filled 2019-02-18: qty 2

## 2019-02-18 MED ORDER — CLINDAMYCIN PHOSPHATE 600 MG/50ML IV SOLN
INTRAVENOUS | Status: AC
  Filled 2019-02-18: qty 50

## 2019-02-18 MED ORDER — HYDROCODONE-ACETAMINOPHEN 5-325 MG PO TABS: 1.0000 | ORAL_TABLET | Freq: Four times a day (QID) | ORAL | 0 refills | Status: DC | PRN

## 2019-02-18 MED ORDER — FAMOTIDINE 20 MG PO TABS
ORAL_TABLET | ORAL | Status: AC
  Administered 2019-02-18: 20 mg via ORAL
  Filled 2019-02-18: qty 1

## 2019-02-18 MED ORDER — FENTANYL CITRATE (PF) 100 MCG/2ML IJ SOLN
INTRAMUSCULAR | Status: DC | PRN
  Administered 2019-02-18 (×4): 25 ug via INTRAVENOUS
  Administered 2019-02-18: 50 ug via INTRAVENOUS
  Administered 2019-02-18: 25 ug via INTRAVENOUS
  Administered 2019-02-18: 50 ug via INTRAVENOUS
  Administered 2019-02-18: 25 ug via INTRAVENOUS

## 2019-02-18 MED ORDER — BUPIVACAINE HCL (PF) 0.5 % IJ SOLN
INTRAMUSCULAR | Status: AC
  Filled 2019-02-18: qty 30

## 2019-02-18 MED ORDER — FENTANYL CITRATE (PF) 100 MCG/2ML IJ SOLN
INTRAMUSCULAR | Status: AC
  Administered 2019-02-18: 25 ug via INTRAVENOUS
  Filled 2019-02-18: qty 2

## 2019-02-18 MED ORDER — LACTATED RINGERS IV SOLN: INTRAVENOUS | Status: DC

## 2019-02-18 MED ORDER — ROCURONIUM BROMIDE 50 MG/5ML IV SOLN
INTRAVENOUS | Status: AC
  Filled 2019-02-18: qty 1

## 2019-02-18 MED ORDER — OXYCODONE HCL 5 MG PO TABS
ORAL_TABLET | ORAL | Status: AC
  Filled 2019-02-18: qty 1

## 2019-02-18 MED ORDER — GABAPENTIN 300 MG PO CAPS: 300.0000 mg | ORAL_CAPSULE | ORAL | Status: AC

## 2019-02-18 MED ORDER — GLYCOPYRROLATE 0.2 MG/ML IJ SOLN
INTRAMUSCULAR | Status: DC | PRN
  Administered 2019-02-18: .2 mg via INTRAVENOUS

## 2019-02-18 MED ORDER — CEFAZOLIN SODIUM-DEXTROSE 2-4 GM/100ML-% IV SOLN
2.0000 g | INTRAVENOUS | Status: AC
  Administered 2019-02-18: 2 g via INTRAVENOUS

## 2019-02-18 MED ORDER — CHLORHEXIDINE GLUCONATE CLOTH 2 % EX PADS: 6.0000 | MEDICATED_PAD | Freq: Once | CUTANEOUS | Status: DC

## 2019-02-18 MED ORDER — DEXAMETHASONE SODIUM PHOSPHATE 10 MG/ML IJ SOLN
INTRAMUSCULAR | Status: DC | PRN
  Administered 2019-02-18: 10 mg via INTRAVENOUS

## 2019-02-18 MED ORDER — OXYCODONE HCL 5 MG PO TABS
5.0000 mg | ORAL_TABLET | Freq: Once | ORAL | Status: AC
  Administered 2019-02-18: 5 mg via ORAL

## 2019-02-18 MED ORDER — CEFAZOLIN SODIUM-DEXTROSE 2-4 GM/100ML-% IV SOLN: 2.0000 g | INTRAVENOUS | Status: DC

## 2019-02-18 MED ORDER — GABAPENTIN 300 MG PO CAPS
ORAL_CAPSULE | ORAL | Status: AC
  Administered 2019-02-18: 300 mg via ORAL
  Filled 2019-02-18: qty 1

## 2019-02-18 MED ORDER — GABAPENTIN 300 MG PO CAPS: 300.0000 mg | ORAL_CAPSULE | ORAL | Status: DC

## 2019-02-18 MED ORDER — NEOMYCIN-POLYMYXIN B GU 40-200000 IR SOLN
Status: AC
  Filled 2019-02-18: qty 20

## 2019-02-18 MED ORDER — BUPIVACAINE HCL (PF) 0.5 % IJ SOLN
INTRAMUSCULAR | Status: DC | PRN
  Administered 2019-02-18: 30 mL

## 2019-02-18 MED ORDER — SEVOFLURANE IN SOLN
RESPIRATORY_TRACT | Status: AC
  Filled 2019-02-18: qty 250

## 2019-02-18 MED ORDER — MELOXICAM 7.5 MG PO TABS: 15.0000 mg | ORAL_TABLET | ORAL | Status: AC

## 2019-02-18 MED ORDER — LIDOCAINE HCL (CARDIAC) PF 100 MG/5ML IV SOSY
PREFILLED_SYRINGE | INTRAVENOUS | Status: DC | PRN
  Administered 2019-02-18: 100 mg via INTRAVENOUS

## 2019-02-18 MED ORDER — FENTANYL CITRATE (PF) 100 MCG/2ML IJ SOLN
25.0000 ug | INTRAMUSCULAR | Status: DC | PRN
  Administered 2019-02-18 (×3): 25 ug via INTRAVENOUS

## 2019-02-18 MED ORDER — MIDAZOLAM HCL 2 MG/2ML IJ SOLN
INTRAMUSCULAR | Status: DC | PRN
  Administered 2019-02-18: 2 mg via INTRAVENOUS

## 2019-02-18 MED ORDER — DEXAMETHASONE SODIUM PHOSPHATE 10 MG/ML IJ SOLN
INTRAMUSCULAR | Status: AC
  Filled 2019-02-18: qty 1

## 2019-02-18 MED ORDER — ONDANSETRON HCL 4 MG/2ML IJ SOLN
4.0000 mg | Freq: Once | INTRAMUSCULAR | Status: AC | PRN
  Administered 2019-02-18: 4 mg via INTRAVENOUS

## 2019-02-18 MED ORDER — CLINDAMYCIN PHOSPHATE 600 MG/50ML IV SOLN
600.0000 mg | INTRAVENOUS | Status: AC
  Administered 2019-02-18: 600 mg via INTRAVENOUS

## 2019-02-18 MED ORDER — GABAPENTIN 400 MG PO CAPS: 400.0000 mg | ORAL_CAPSULE | Freq: Three times a day (TID) | ORAL | 3 refills | Status: DC

## 2019-02-18 SURGICAL SUPPLY — 45 items
BIT DRILL 2 FAST STEP (BIT) ×2 IMPLANT
BIT DRILL 2.5X4 QC (BIT) ×2 IMPLANT
BLADE SURG MINI STRL (BLADE) ×2 IMPLANT
BNDG COHESIVE 4X5 TAN STRL (GAUZE/BANDAGES/DRESSINGS) IMPLANT
BNDG ESMARK 4X12 TAN STRL LF (GAUZE/BANDAGES/DRESSINGS) ×2 IMPLANT
CANISTER SUCT 1200ML W/VALVE (MISCELLANEOUS) ×2 IMPLANT
CHLORAPREP W/TINT 26 (MISCELLANEOUS) ×2 IMPLANT
COVER WAND RF STERILE (DRAPES) ×2 IMPLANT
CUFF TOURN SGL QUICK 18X4 (TOURNIQUET CUFF) IMPLANT
DRAPE FLUOR MINI C-ARM 54X84 (DRAPES) ×2 IMPLANT
DRSG GAUZE FLUFF 36X18 (GAUZE/BANDAGES/DRESSINGS) ×2 IMPLANT
ELECT REM PT RETURN 9FT ADLT (ELECTROSURGICAL) ×2
ELECTRODE REM PT RTRN 9FT ADLT (ELECTROSURGICAL) ×1 IMPLANT
GAUZE SPONGE 4X4 12PLY STRL (GAUZE/BANDAGES/DRESSINGS) ×2 IMPLANT
GAUZE XEROFORM 1X8 LF (GAUZE/BANDAGES/DRESSINGS) ×2 IMPLANT
GLOVE INDICATOR 8.0 STRL GRN (GLOVE) ×2 IMPLANT
GLOVE SURG ORTHO 8.5 STRL (GLOVE) ×2 IMPLANT
GOWN STRL REUS W/ TWL LRG LVL3 (GOWN DISPOSABLE) ×1 IMPLANT
GOWN STRL REUS W/TWL LRG LVL3 (GOWN DISPOSABLE) ×1
GOWN STRL REUS W/TWL LRG LVL4 (GOWN DISPOSABLE) ×2 IMPLANT
K-WIRE 1.6 (WIRE) ×1
K-WIRE FX5X1.6XNS BN SS (WIRE) ×1
KIT TURNOVER KIT A (KITS) ×2 IMPLANT
KWIRE FX5X1.6XNS BN SS (WIRE) ×1 IMPLANT
NDL SAFETY ECLIPSE 18X1.5 (NEEDLE) ×1 IMPLANT
NEEDLE HYPO 18GX1.5 SHARP (NEEDLE) ×1
NS IRRIG 500ML POUR BTL (IV SOLUTION) ×2 IMPLANT
PACK EXTREMITY ARMC (MISCELLANEOUS) ×2 IMPLANT
PADDING CAST 4IN STRL (MISCELLANEOUS) ×2
PADDING CAST BLEND 4X4 STRL (MISCELLANEOUS) ×2 IMPLANT
PEG SUBCHONDRAL SMOOTH 2.0X16 (Peg) ×4 IMPLANT
PEG SUBCHONDRAL SMOOTH 2.0X18 (Peg) ×2 IMPLANT
PLATE STAN 24.4X59.5 RT (Plate) ×2 IMPLANT
SCREW CORT 3.5X14 LNG (Screw) ×2 IMPLANT
SCREW CORT 3.5X16 LNG (Screw) ×4 IMPLANT
SPLINT CAST 1 STEP 3X12 (MISCELLANEOUS) ×2 IMPLANT
SPONGE LAP 18X18 RF (DISPOSABLE) ×2 IMPLANT
STAPLER SKIN PROX 35W (STAPLE) ×2 IMPLANT
STOCKINETTE BIAS CUT 4 980044 (GAUZE/BANDAGES/DRESSINGS) ×2 IMPLANT
STOCKINETTE STRL 4IN 9604848 (GAUZE/BANDAGES/DRESSINGS) ×2 IMPLANT
SUT VIC AB 2-0 SH 27 (SUTURE) ×1
SUT VIC AB 2-0 SH 27XBRD (SUTURE) ×1 IMPLANT
SUT VIC AB 3-0 PS2 18 (SUTURE) ×2 IMPLANT
SUT VIC AB 3-0 SH 27 (SUTURE) ×1
SUT VIC AB 3-0 SH 27X BRD (SUTURE) ×1 IMPLANT

## 2019-02-18 NOTE — Discharge Instructions (Signed)

## 2019-02-18 NOTE — Anesthesia Preprocedure Evaluation (Addendum)
Anesthesia Evaluation  Patient identified by MRN, date of birth, ID band Patient awake    Reviewed: Allergy & Precautions, NPO status , Patient's Chart, lab work & pertinent test results  History of Anesthesia Complications Negative for: history of anesthetic complications  Airway Mallampati: III       Dental   Pulmonary sleep apnea and Continuous Positive Airway Pressure Ventilation , neg COPD, Not current smoker, former smoker,           Cardiovascular hypertension, Pt. on medications (-) Past MI and (-) CHF (-) dysrhythmias (-) Valvular Problems/Murmurs     Neuro/Psych neg Seizures Depression    GI/Hepatic Neg liver ROS, neg GERD  ,  Endo/Other  neg diabetes  Renal/GU Renal InsufficiencyRenal disease     Musculoskeletal   Abdominal   Peds  Hematology   Anesthesia Other Findings   Reproductive/Obstetrics                            Anesthesia Physical Anesthesia Plan  ASA: II  Anesthesia Plan: General   Post-op Pain Management:    Induction: Intravenous  PONV Risk Score and Plan: 3 and Ondansetron, Dexamethasone and Midazolam  Airway Management Planned: Oral ETT  Additional Equipment:   Intra-op Plan:   Post-operative Plan:   Informed Consent: I have reviewed the patients History and Physical, chart, labs and discussed the procedure including the risks, benefits and alternatives for the proposed anesthesia with the patient or authorized representative who has indicated his/her understanding and acceptance.       Plan Discussed with:   Anesthesia Plan Comments:         Anesthesia Quick Evaluation

## 2019-02-18 NOTE — Op Note (Signed)
02/18/2019  3:32 PM  PATIENT:  Rachel Townsend    PRE-OPERATIVE DIAGNOSIS:  S52.591Aother fractures of lower end of right radius initial encounter for closed fracture  POST-OPERATIVE DIAGNOSIS:  Same  PROCEDURE:  OPEN REDUCTION INTERNAL FIXATION (ORIF) RADIAL FRACTURE  SURGEON:  Park Breed, MD  TOURNIQUET TIME:  110  MIN  ANESTHESIA:   General  PREOPERATIVE INDICATIONS:  Rachel Townsend is a  59 y.o. female with a diagnosis of S52.591Aother fractures of lower end of right radius initial encounter for closed fracture who failed conservative measures and elected for surgical management.  She initially had a nondisplaced fracture which shifted volarly with splint management.  Lunate was subluxed volarly and I felt that this should be reseated.  The risks benefits and alternatives were discussed with the patient preoperatively including but not limited to the risks of infection, bleeding, nerve injury, malunion, nonunion, wrist stiffness, persistent wrist pain, osteoarthritis and the need for further surgery. Medical risks include but are not limited to DVT and pulmonary embolism, myocardial infarction, stroke, pneumonia, respiratory failure and death. Patient  understood these risks and wished to proceed.   OPERATIVE IMPLANTS: Biomet hand innovations plate , 3 hole  OPERATIVE FINDINGS: There was a volar fracture of the distal radius which only encompassed a short amount of the radial rim itself.  There was early callus.  After freeing the displaced fragments up and reduced in place the joint was again congruent.  The plate was then applied.  OPERATIVE PROCEDURE: Patient was seen in the preoperative area. I marked the operative hand with the word yes and my initials according the hospital's correct site of surgery protocol. Patient was then brought to the operating roomand was placed supine on the operative table and underwent general anesthesia with an LMA.   The operative arm was prepped  and draped in a sterile fashion. A timeout performed to verify the patient's name, date of birth, medical record number, correct site of surgery correct procedure to be performed. The timeout was also used a timeout to verify patient received antibiotics and appropriate instruments, implants and radiographs studies were available in the room. Once all in attendance were in agreement case began.   Patient then had the operative extremity exsanguinated with an Esmarch. The tourniquet was placed on the upper extremity and inflated 250 mm.   A linear incision was then made over the FCR tendon. The subcutaneous tissue was carefully dissected using Metzenbaum scissor and Adson pickup. Retractors were used to protect the radial artery and median nerve. The pronator quadratus was identified and incised and elevated off the volar surface of the distal radius.  As above, the displaced fragments were identified and callus was removed with a rongeur.  The displaced fragments were then freed up and pushed back into place manually.  A 3  hole [employer] volar plate was then positioned on the under surface of the distal radius. It was held into position with a K wire. The position of the plate was confirmed on AP and lateral images.  It took a good deal of configuring to get the plate in the right position.  The distal fragments were so small that the plate had to be extended out more distally than usual and only pegs could be placed in the proximal row.  Once the plate was in good position a cortical screw was placed bicortically in the sliding hole The proximal row of pegs was placed first. Each individual peg hole was drilled and  then measured with a depth gauge. The proximal row had smooth pegs placed.  Care was taken to avoid penetration of any peg through the articular surface of the distal radius.  Once all pegs were placed, the attention was turned back to placement of bicortical shaft screws. Additional screws  were placed in the plate to fill the remaining holes. The wound was then copiously irrigated. Final FluoroScan imaging of the construct were taken. The fracture was in anatomic position and the hardware was well-positioned. The wound again was copiously irrigated. The soft tissue was then carefully over the plate. The tissues were infiltrated with 1/2% marcaine.  The skin was closed with staples. Xeroform and a dry sterile dressing were applied along with a volar splint. I was scrubbed and present for the entire case and all sharp and instrument counts were correct at the conclusion the case. The patient tolerated this procedure well and was awakened and taken to the recovery room in good condition.   Earnestine Leys, MD

## 2019-02-18 NOTE — H&P (Signed)
THE PATIENT WAS SEEN PRIOR TO SURGERY TODAY.  HISTORY, ALLERGIES, HOME MEDICATIONS AND OPERATIVE PROCEDURE WERE REVIEWED. RISKS AND BENEFITS OF SURGERY DISCUSSED WITH PATIENT AGAIN.  NO CHANGES FROM INITIAL HISTORY AND PHYSICAL NOTED.    

## 2019-02-18 NOTE — Anesthesia Procedure Notes (Signed)
Procedure Name: LMA Insertion Performed by: Erica Richwine, CRNA Pre-anesthesia Checklist: Patient identified, Patient being monitored, Timeout performed, Emergency Drugs available and Suction available Patient Re-evaluated:Patient Re-evaluated prior to induction Oxygen Delivery Method: Circle system utilized Preoxygenation: Pre-oxygenation with 100% oxygen Induction Type: IV induction Ventilation: Mask ventilation without difficulty LMA: LMA inserted LMA Size: 3.5 Tube type: Oral Number of attempts: 1 Placement Confirmation: positive ETCO2 and breath sounds checked- equal and bilateral Tube secured with: Tape Dental Injury: Teeth and Oropharynx as per pre-operative assessment        

## 2019-02-18 NOTE — Progress Notes (Signed)
Pt having pain 8/10 after 100 mcg fentanyl. Dr. Rosey Bath notified. Acknowledged. Orders received.

## 2019-02-18 NOTE — Progress Notes (Signed)
CORRECTED H&P  HPI:  Rachel Townsend is a 59 y.o. female who presents for preoperative history and physical with a diagnosis of displaced right distal radius fracture. The joint is subluxed and needs to be restored. . Symptoms are rated as moderate to severe, and have been worsening. This is significantly impairing activities of daily living. She has elected for surgical management.      Past Medical History:  Diagnosis Date  . Allergy   . Fibromyalgia   . Hypertension   . Wrist pain         Past Surgical History:  Procedure Laterality Date  . BREAST BIOPSY Left    core- neg  . BREAST BIOPSY Left    stereo- neg  . ECTOPIC PREGNANCY SURGERY    . FOOT SURGERY     Social History        Socioeconomic History  . Marital status: Married    Spouse name: Not on file  . Number of children: Not on file  . Years of education: Not on file  . Highest education level: Not on file  Occupational History  . 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 Smoking status: Former Smoker    Quit date: 11/23/1984    Years since quitting: 34.2  . Smokeless tobacco: Former Network engineer and Sexual Activity  . Alcohol use: Yes    Alcohol/week: 10.0 standard drinks    Types: 10 Glasses of wine per week    Comment: weekly  . Drug use: No  . Sexual activity: Yes    Birth control/protection: Injection  Other Topics Concern  . Not on file  Social History Narrative  . Not on file   Social Determinants of Health      Financial Resource Strain:   . Difficulty of Paying Living Expenses: Not on file  Food Insecurity:   . Worried About Charity fundraiser in the Last Year: Not on file  . Ran Out of Food in the Last Year: Not on file  Transportation Needs:   . Lack of Transportation (Medical): Not on file  . Lack of Transportation (Non-Medical): Not on file  Physical Activity:   . Days of Exercise per Week: Not on file  . Minutes of Exercise per Session: Not on file  Stress:   .  Feeling of Stress : Not on file  Social Connections:   . Frequency of Communication with Friends and Family: Not on file  . Frequency of Social Gatherings with Friends and Family: Not on file  . Attends Religious Services: Not on file  . Active Member of Clubs or Organizations: Not on file  . Attends Archivist Meetings: Not on file  . Marital Status: Not on file        Family History  Problem Relation Age of Onset  . Hypertension Mother   . Diabetes Mother   . Hyperlipidemia Mother   . Cancer Mother   . Breast cancer Mother 32  . Hypertension Father   . Hyperlipidemia Father    No Known Allergies         Prior to Admission medications   Medication Sig Start Date End Date Taking? Authorizing Provider  atorvastatin (LIPITOR) 20 MG tablet TAKE 1 TABLET BY MOUTH DAILY  Patient taking differently: Take 20 mg by mouth daily.  12/30/18   Olin Hauser, DO  b complex vitamins tablet Take 1 tablet by mouth daily.    [provider]  buPROPion (  WELLBUTRIN XL) 300 MG 24 hr tablet TAKE 1 TABLET BY MOUTH DAILY  Patient taking differently: Take 300 mg by mouth daily.  01/17/19   Karamalegos, Devonne Doughty, DO  Calcium-Vitamin D 600-200 MG-UNIT tablet Take 1 tablet by mouth 2 (two) times daily.     [provider]  Cholecalciferol (VITAMIN D-1000 MAX ST) 1000 units tablet Take 1,000 Units by mouth daily.    [provider]  Co-Enzyme Q-10 100 MG CAPS Take 100 mg by mouth daily.     [provider]  cyclobenzaprine (FLEXERIL) 10 MG tablet TAKE 1 TABLET BY MOUTH AT BEDTIME  Patient taking differently: Take 10 mg by mouth at bedtime.  11/26/17   Karamalegos, Devonne Doughty, DO  etodolac (LODINE) 500 MG tablet Take 500 mg by mouth daily.    [provider]  gabapentin (NEURONTIN) 600 MG tablet TAKE 1 AND 1/2 TABLETS BY MOUTH TWO TIMES DAILY  Patient taking differently: Take 600 mg by mouth 2 (two) times daily.  09/27/18   Karamalegos,  Devonne Doughty, DO  ketorolac (TORADOL) 10 MG tablet Take 1 tablet (10 mg total) by mouth every 6 (six) hours as needed for moderate pain or severe pain.  Patient not taking: Reported on 02/15/2019 01/21/19   Coral Spikes, DO  lisinopril (ZESTRIL) 5 MG tablet TAKE 1 TABLET BY MOUTH DAILY  Patient taking differently: Take 5 mg by mouth daily.  01/17/19   Karamalegos, Devonne Doughty, DO  loratadine (CLARITIN) 10 MG tablet Take 10 mg by mouth daily.     [provider]  Magnesium (MAGNACAPS) 100 MG CAPS Take 200 mg by mouth 2 (two) times daily.    [provider]  niacin 500 MG tablet Take 500 mg by mouth daily.     [provider]  sertraline (ZOLOFT) 100 MG tablet TAKE 1 TABLET BY MOUTH DAILY  Patient taking differently: Take 100 mg by mouth daily.  01/17/19   Karamalegos, Devonne Doughty, DO  Icosapent Ethyl (VASCEPA) 1 g CAPS Take 2 capsules (2 g total) by mouth 2 (two) times daily. 10/30/18 01/21/19  Mikey College, NP  Positive ROS: All other systems have been reviewed and were otherwise negative with the exception of those mentioned in the HPI and as above.  Physical Exam:  General: Alert, no acute distress  Cardiovascular: No pedal edema. Heart is regular and without murmur.  Respiratory: No cyanosis, no use of accessory musculature. Lungs are clear.  GI: No organomegaly, abdomen is soft and non-tender  Skin: No lesions in the area of chief complaint  Neurologic: Sensation intact distally  Psychiatric: Patient is competent for consent with normal mood and affect  Lymphatic: No axillary or cervical lymphadenopathy   MUSCULOSKELETAL: Right distal radius is tender with fairly good motion. Pain at fracture site. Skin intact. CSM good distally.   Assessment:  Displaced  right distal radius fracture   Plan:  Plan for open reduction internal fixation right distal radius  The risks benefits and alternatives were discussed with the patient including but not limited to  the risks of nonoperative treatment, versus surgical intervention including infection, bleeding, nerve injury, blood clots, cardiopulmonary complications, morbidity, mortality, among others, and they were willing to proceed.

## 2019-02-18 NOTE — Transfer of Care (Signed)
Immediate Anesthesia Transfer of Care Note  Patient: Rachel Townsend  Procedure(s) Performed: OPEN REDUCTION INTERNAL FIXATION (ORIF) RADIAL FRACTURE (Right Wrist)  Patient Location: PACU  Anesthesia Type:General  Level of Consciousness: sedated  Airway & Oxygen Therapy: Patient Spontanous Breathing and Patient connected to face mask oxygen  Post-op Assessment: Report given to RN and Post -op Vital signs reviewed and stable  Post vital signs: Reviewed and stable  Last Vitals:  Vitals Value Taken Time  BP 126/75 02/18/19 1529  Temp 36 C 02/18/19 1529  Pulse 87 02/18/19 1529  Resp 12 02/18/19 1529  SpO2 96 % 02/18/19 1529  Vitals shown include unvalidated device data.  Last Pain:  Vitals:   02/18/19 1529  TempSrc:   PainSc: 0-No pain         Complications: No apparent anesthesia complications

## 2019-02-20 NOTE — Anesthesia Postprocedure Evaluation (Signed)
Anesthesia Post Note  Patient: Marialena Adamek  Procedure(s) Performed: OPEN REDUCTION INTERNAL FIXATION (ORIF) RADIAL FRACTURE (Right Wrist)  Patient location during evaluation: PACU Anesthesia Type: General Level of consciousness: awake and alert Pain management: pain level controlled Vital Signs Assessment: post-procedure vital signs reviewed and stable Respiratory status: spontaneous breathing, nonlabored ventilation, respiratory function stable and patient connected to nasal cannula oxygen Cardiovascular status: blood pressure returned to baseline and stable Postop Assessment: no apparent nausea or vomiting Anesthetic complications: no     Last Vitals:  Vitals:   02/18/19 1640 02/18/19 1659  BP:  114/68  Pulse: 79 78  Resp: 20 16  Temp: (!) 36.3 C 36.4 C  SpO2: 97% 94%    Last Pain:  Vitals:   02/19/19 0845  TempSrc:   PainSc: 0-No pain                 Martha Clan

## 2019-04-28 ENCOUNTER — Ambulatory Visit: Payer: Self-pay | Attending: Internal Medicine

## 2019-04-28 DIAGNOSIS — Z23 Encounter for immunization: Secondary | ICD-10-CM

## 2019-04-28 NOTE — Progress Notes (Signed)
   Covid-19 Vaccination Clinic  Name:  Rachel Townsend    MRN: [id number] DOB: 1960-04-11  04/28/2019  [name] was observed post Covid-19 immunization for 15 minutes without incident. She was provided with Vaccine Information Sheet and instruction to access the V-Safe system.   [name] was instructed to call 911 with any severe reactions post vaccine: Marland Kitchen Difficulty breathing  . Swelling of face and throat  . A fast heartbeat  . A bad rash all over body  . Dizziness and weakness   Immunizations Administered    Name Date Dose VIS Date Route   Pfizer COVID-19 Vaccine 04/28/2019  5:22 PM 0.3 mL 01/11/2019 Intramuscular   Manufacturer: [place]   Lot: [id number]   [place]: [id number]

## 2019-05-19 ENCOUNTER — Ambulatory Visit: Payer: Self-pay | Attending: Internal Medicine

## 2019-05-19 DIAGNOSIS — Z23 Encounter for immunization: Secondary | ICD-10-CM

## 2019-05-19 NOTE — Progress Notes (Signed)
   Covid-19 Vaccination Clinic  Name:  Rachel Townsend    MRN: [id number] DOB: 11/09/1960  05/19/2019  Rachel Townsend was observed post Covid-19 immunization for 15 minutes without incident. She was provided with Vaccine Information Sheet and instruction to access the V-Safe system.   Rachel Townsend was instructed to call 911 with any severe reactions post vaccine: Marland Kitchen Difficulty breathing  . Swelling of face and throat  . A fast heartbeat  . A bad rash all over body  . Dizziness and weakness   Immunizations Administered    Name Date Dose VIS Date Route   Pfizer COVID-19 Vaccine 05/19/2019  5:32 PM 0.3 mL 01/11/2019 Intramuscular   Manufacturer: [place]   Lot: [id number]   [place]: [id number]

## 2019-09-05 LAB — NOVEL CORONAVIRUS, NAA: SARS-CoV-2, NAA: 1490

## 2019-09-05 LAB — LIPID PANEL
Cholesterol: 221 — AB (ref 0–200)
HDL: 54 (ref 35–70)
LDL Cholesterol: 132
Triglycerides: 200 — AB (ref 40–160)

## 2019-09-05 LAB — BASIC METABOLIC PANEL
Creatinine: 1.4 — AB (ref 0.5–1.1)
Glucose: 96

## 2019-09-05 LAB — HEMOGLOBIN A1C: Hemoglobin A1C: 5.7

## 2019-11-08 ENCOUNTER — Other Ambulatory Visit: Payer: Self-pay | Admitting: Family Medicine

## 2019-11-08 DIAGNOSIS — M797 Fibromyalgia: Secondary | ICD-10-CM

## 2019-11-08 NOTE — Telephone Encounter (Signed)
Courtesy refill; pt has appt 11/18/2019 with Dr. Parks Ranger

## 2019-11-13 ENCOUNTER — Encounter: Payer: Self-pay | Admitting: Family Medicine

## 2019-11-18 ENCOUNTER — Other Ambulatory Visit: Payer: Self-pay

## 2019-11-18 ENCOUNTER — Encounter: Payer: Self-pay | Admitting: Family Medicine

## 2019-11-18 ENCOUNTER — Ambulatory Visit (INDEPENDENT_AMBULATORY_CARE_PROVIDER_SITE_OTHER): Payer: Managed Care, Other (non HMO) | Admitting: Family Medicine

## 2019-11-18 ENCOUNTER — Other Ambulatory Visit: Payer: Self-pay | Admitting: Family Medicine

## 2019-11-18 VITALS — BP 123/75 | HR 75 | Temp 98.0°F | Resp 16 | Ht 61.0 in | Wt 229.0 lb

## 2019-11-18 DIAGNOSIS — R7989 Other specified abnormal findings of blood chemistry: Secondary | ICD-10-CM | POA: Diagnosis not present

## 2019-11-18 DIAGNOSIS — Z23 Encounter for immunization: Secondary | ICD-10-CM

## 2019-11-18 DIAGNOSIS — I129 Hypertensive chronic kidney disease with stage 1 through stage 4 chronic kidney disease, or unspecified chronic kidney disease: Secondary | ICD-10-CM

## 2019-11-18 DIAGNOSIS — N183 Chronic kidney disease, stage 3 unspecified: Secondary | ICD-10-CM

## 2019-11-18 DIAGNOSIS — N1831 Chronic kidney disease, stage 3a: Secondary | ICD-10-CM | POA: Diagnosis not present

## 2019-11-18 DIAGNOSIS — Z1211 Encounter for screening for malignant neoplasm of colon: Secondary | ICD-10-CM

## 2019-11-18 DIAGNOSIS — Z0000 Encounter for general adult medical examination without abnormal findings: Secondary | ICD-10-CM | POA: Diagnosis not present

## 2019-11-18 DIAGNOSIS — F3341 Major depressive disorder, recurrent, in partial remission: Secondary | ICD-10-CM

## 2019-11-18 DIAGNOSIS — E782 Mixed hyperlipidemia: Secondary | ICD-10-CM

## 2019-11-18 DIAGNOSIS — Z9989 Dependence on other enabling machines and devices: Secondary | ICD-10-CM

## 2019-11-18 DIAGNOSIS — G4733 Obstructive sleep apnea (adult) (pediatric): Secondary | ICD-10-CM

## 2019-11-18 DIAGNOSIS — R5382 Chronic fatigue, unspecified: Secondary | ICD-10-CM

## 2019-11-18 DIAGNOSIS — M797 Fibromyalgia: Secondary | ICD-10-CM

## 2019-11-18 DIAGNOSIS — R7309 Other abnormal glucose: Secondary | ICD-10-CM

## 2019-11-18 DIAGNOSIS — Z1231 Encounter for screening mammogram for malignant neoplasm of breast: Secondary | ICD-10-CM

## 2019-11-18 NOTE — Assessment & Plan Note (Addendum)
Well-controlled HTN - Home BP readings not checking  CKD-III elevated creatinine on last check, trending up - likely with sodas and NSAIDs    Plan:  1. Continue current BP regimen - Lisinopril 5mg  daily 2. Encourage improved lifestyle - low sodium diet, regular exercise 3. May consider monitor BP outside office, bring readings to next visit, if persistently >140/90 or new symptoms notify office sooner

## 2019-11-18 NOTE — Assessment & Plan Note (Addendum)
Elevated creatinine 1.4, prior 1.2 to 1.3 trend Suspected factors with soda intake and also still regular dosing oral NSAID etolodac had reduced in past but still taking daily dose it seems  Plan Control HTN with med Monitor A1c, in normal vs PreDM range REDUCE Oral NSAID Etolodac now switch to every other day dosing or 1 week on / 1 week off Limit sodas Recheck in 4 months Creatinine lab

## 2019-11-18 NOTE — Progress Notes (Signed)
Subjective:    Patient ID: Rachel Townsend, female    DOB: [birth year], 59 y.o.   MRN: [id number]  Rachel Townsend is a 59 y.o. female presenting on 11/18/2019 for Annual Exam   HPI   Here for Annual Physical and Lab Review. Note labs done through [facility] on 09/05/19 biometric. She has BMI exception form today.  Lifestyle / Morbid Obesity BMI >43 - Less physically active - Weight increased - Goal to improve diet  CHRONIC HTN: Reportsno concerns, not checking BP at home. Current Meds -Lisinopril 5mg  Reports good compliance, took meds today. Tolerating well, w/o complaints.  FOLLOW-UPInsomnia / OSAon CPAP Continuedtreat withCPAP for OSA Improved sleep with CPAP. She adheres to machine and uses it nightly avg >8 hours nightly - Improved weight loss and more energy -No new concerns or symptoms - Insomnia is improved now  FOLLOW-UP DEPRESSION / FIBROMYALGIA: See prior notes for background information. - Today patient reportsno new concerns. Mood remains improved much better - Still has generalized pain from fibromyalgia, but seems improved with better sleep - Continues Sertraline and Wellbutrin - Takes Gabapentin 900mg  (1.5 tabs 600mg ) BID Taking NSAID Etolodac  Elevated Creatinine Recent lab shows elevated to Cr 1.4 On NSAID Etolodac 500mg  DAILY now, not BID but still has elevated Cr taking Tylenol PRN Tries to stay hydrated, not always. She drinks diet sodas regularly too  Health Maintenance: Last mammogram 11/2018 abnormal, had Ultrasound done 12/2018. Now due for 1 year repeat, request go go at [facility].  Due for Colon CA Screening. Last done 2016 in [place]. Ready to repeat, had prior pre-cancerous polyps    Depression screen [facility] 2/9 11/18/2019 10/30/2018 11/23/2017  Decreased Interest 1 0 2  Down, Depressed, Hopeless 1 1 1   PHQ - 2 Score 2 1 3   Altered sleeping 2 2 0  Tired, decreased energy 1 1 1   Change in appetite 0 2 3  Feeling bad or failure about  yourself  1 1 0  Trouble concentrating 0 0 0  Moving slowly or fidgety/restless 0 0 0  Suicidal thoughts 0 0 0  PHQ-9 Score 6 7 7   Difficult doing work/chores Not difficult at all Not difficult at all Somewhat difficult  Some recent data might be hidden   GAD 7 : Generalized Anxiety Score 11/18/2019 05/22/2017  Nervous, Anxious, on Edge 1 1  Control/stop worrying 1 1  Worry too much - different things 1 1  Trouble relaxing 1 1  Restless 0 0  Easily annoyed or irritable 0 0  Afraid - awful might happen 0 0  Total GAD 7 Score 4 4  Anxiety Difficulty Not difficult at all Not difficult at all      Past Medical History:  Diagnosis Date  . Allergy   . Fibromyalgia   . Hypertension   . Wrist pain    Past Surgical History:  Procedure Laterality Date  . BREAST BIOPSY Left    core- neg  . BREAST BIOPSY Left    stereo- neg  . ECTOPIC PREGNANCY SURGERY    . FOOT SURGERY    . ORIF RADIAL FRACTURE Right 02/18/2019   Procedure: OPEN REDUCTION INTERNAL FIXATION (ORIF) RADIAL FRACTURE;  Surgeon: Earnestine Leys, MD;  Location: [facility] ORS;  Service: Orthopedics;  Laterality: Right;   Social History   Socioeconomic History  . Marital status: Married    Spouse name: Not on file  . Number of children: Not on file  . Years of education: Not on file  . Highest  education level: Not on file  Occupational History  . 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 Smoking status: Former Smoker    Quit date: 11/23/1984    Years since quitting: 35.0  . Smokeless tobacco: Former Network engineer  . Vaping Use: Never used  Substance and Sexual Activity  . Alcohol use: Yes    Alcohol/week: 10.0 standard drinks    Types: 10 Glasses of wine per week    Comment: weekly  . Drug use: No  . Sexual activity: Yes    Birth control/protection: Injection  Other Topics Concern  . Not on file  Social History Narrative  . Not on file   Social Determinants of Health   Financial Resource  Strain:   . Difficulty of Paying Living Expenses: Not on file  Food Insecurity:   . Worried About Charity fundraiser in the Last Year: Not on file  . Ran Out of Food in the Last Year: Not on file  Transportation Needs:   . Lack of Transportation (Medical): Not on file  . Lack of Transportation (Non-Medical): Not on file  Physical Activity:   . Days of Exercise per Week: Not on file  . Minutes of Exercise per Session: Not on file  Stress:   . Feeling of Stress : Not on file  Social Connections:   . Frequency of Communication with Friends and Family: Not on file  . Frequency of Social Gatherings with Friends and Family: Not on file  . Attends Religious Services: Not on file  . Active Member of Clubs or Organizations: Not on file  . Attends Archivist Meetings: Not on file  . Marital Status: Not on file  Intimate Partner Violence:   . Fear of Current or Ex-Partner: Not on file  . Emotionally Abused: Not on file  . Physically Abused: Not on file  . Sexually Abused: Not on file   Family History  Problem Relation Age of Onset  . Hypertension Mother   . Diabetes Mother   . Hyperlipidemia Mother   . Cancer Mother   . Breast cancer Mother 43  . Hypertension Father   . Hyperlipidemia Father    Current Outpatient Medications on File Prior to Visit  Medication Sig  . atorvastatin (LIPITOR) 20 MG tablet TAKE 1 TABLET BY MOUTH  DAILY (Patient taking differently: Take 20 mg by mouth daily. )  . b complex vitamins tablet Take 1 tablet by mouth daily.  Marland Kitchen buPROPion (WELLBUTRIN XL) 300 MG 24 hr tablet TAKE 1 TABLET BY MOUTH  DAILY (Patient taking differently: Take 300 mg by mouth daily. )  . Calcium-Vitamin D 600-200 MG-UNIT tablet Take 1 tablet by mouth 2 (two) times daily.   . Cholecalciferol (VITAMIN D-1000 MAX ST) 1000 units tablet Take 1,000 Units by mouth daily.  Marland Kitchen Co-Enzyme Q-10 100 MG CAPS Take 100 mg by mouth daily.   Marland Kitchen etodolac (LODINE) 500 MG tablet Take 500 mg by mouth  daily.  Marland Kitchen gabapentin (NEURONTIN) 600 MG tablet TAKE 1 AND 1/2 TABLETS BY  MOUTH TWICE DAILY  . lisinopril (ZESTRIL) 5 MG tablet TAKE 1 TABLET BY MOUTH  DAILY (Patient taking differently: Take 5 mg by mouth daily. )  . loratadine (CLARITIN) 10 MG tablet Take 10 mg by mouth daily.   . Magnesium (MAGNACAPS) 100 MG CAPS Take 200 mg by mouth 2 (two) times daily.  . niacin 500 MG tablet Take 500 mg by mouth daily.   Marland Kitchen  sertraline (ZOLOFT) 100 MG tablet TAKE 1 TABLET BY MOUTH  DAILY (Patient taking differently: Take 100 mg by mouth daily. )  . [DISCONTINUED] Icosapent Ethyl (VASCEPA) 1 g CAPS Take 2 capsules (2 g total) by mouth 2 (two) times daily.   No current facility-administered medications on file prior to visit.    Review of Systems  Constitutional: Negative for activity change, appetite change, chills, diaphoresis, fatigue and fever.  HENT: Negative for congestion and hearing loss.   Eyes: Negative for visual disturbance.  Respiratory: Negative for cough, chest tightness, shortness of breath and wheezing.   Cardiovascular: Negative for chest pain, palpitations and leg swelling.  Gastrointestinal: Negative for abdominal pain, constipation, diarrhea, nausea and vomiting.  Endocrine: Negative for cold intolerance.  Genitourinary: Negative for dysuria, frequency and hematuria.  Musculoskeletal: Negative for arthralgias and neck pain.  Skin: Negative for rash.  Allergic/Immunologic: Negative for environmental allergies.  Neurological: Negative for dizziness, weakness, light-headedness, numbness and headaches.  Hematological: Negative for adenopathy.  Psychiatric/Behavioral: Negative for behavioral problems, dysphoric mood and sleep disturbance.   Per HPI unless specifically indicated above      Objective:    BP 123/75   Pulse 75   Temp 98 F (36.7 C) (Temporal)   Resp 16   Ht 5\' 1"  (1.549 m)   Wt 229 lb (103.9 kg)   SpO2 98%   BMI 43.27 kg/m   Wt Readings from Last 3  Encounters:  11/18/19 229 lb (103.9 kg)  02/18/19 225 lb (102.1 kg)  01/21/19 230 lb (104.3 kg)    Physical Exam Vitals and nursing note reviewed.  Constitutional:      General: She is not in acute distress.    Appearance: She is well-developed. She is obese. She is not diaphoretic.     Comments: Well-appearing, comfortable, cooperative  HENT:     Head: Normocephalic and atraumatic.  Eyes:     General:        Right eye: No discharge.        Left eye: No discharge.     Conjunctiva/sclera: Conjunctivae normal.     Pupils: Pupils are equal, round, and reactive to light.  Neck:     Thyroid: No thyromegaly.     Vascular: No carotid bruit.  Cardiovascular:     Rate and Rhythm: Normal rate and regular rhythm.     Heart sounds: Normal heart sounds. No murmur heard.   Pulmonary:     Effort: Pulmonary effort is normal. No respiratory distress.     Breath sounds: Normal breath sounds. No wheezing or rales.  Abdominal:     General: Bowel sounds are normal. There is no distension.     Palpations: Abdomen is soft. There is no mass.     Tenderness: There is no abdominal tenderness.  Musculoskeletal:        General: No tenderness. Normal range of motion.     Cervical back: Normal range of motion and neck supple.     Right lower leg: No edema.     Left lower leg: No edema.     Comments: Upper / Lower Extremities: - Normal muscle tone, strength bilateral upper extremities 5/5, lower extremities 5/5  Lymphadenopathy:     Cervical: No cervical adenopathy.  Skin:    General: Skin is warm and dry.     Findings: No erythema or rash.  Neurological:     Mental Status: She is alert and oriented to person, place, and time.     Comments:  Distal sensation intact to light touch all extremities  Psychiatric:        Behavior: Behavior normal.     Comments: Well groomed, good eye contact, normal speech and thoughts       CLINICAL DATA:  Screening.  EXAM: DIGITAL SCREENING BILATERAL  MAMMOGRAM WITH TOMO AND CAD  COMPARISON:  Previous exam(s).  ACR Breast Density Category b: There are scattered areas of fibroglandular density.  FINDINGS: In the right breast, a possible asymmetry warrants further evaluation. In the left breast, no findings suspicious for malignancy. Images were processed with CAD.  IMPRESSION: Further evaluation is suggested for possible asymmetry in the right breast.  RECOMMENDATION: Diagnostic mammogram and possibly ultrasound of the right breast. (Code:[id number]-[id number])  The patient will be contacted regarding the findings, and additional imaging will be scheduled.  BI-RADS CATEGORY  0: Incomplete. Need additional imaging evaluation and/or prior mammograms for comparison.   Electronically Signed   By: Kristopher Oppenheim M.D.   On: 11/27/2018 13:39   ---------  CLINICAL DATA:  59 year old female for further evaluation of possible RIGHT breast asymmetries on screening mammogram.  EXAM: DIGITAL DIAGNOSTIC RIGHT MAMMOGRAM WITH CAD AND TOMO  ULTRASOUND RIGHT BREAST  COMPARISON:  Previous exam(s).  ACR Breast Density Category b: There are scattered areas of fibroglandular density.  FINDINGS: 2D/3D full field and spot compression views of the RIGHT breast demonstrate no persistent suspicious abnormalities in the areas of the possible asymmetries.  Mammographic images were processed with CAD.  Targeted ultrasound is performed, showing no solid or cystic mass, distortion or abnormal shadowing within the RIGHT breast, in the areas of the screening mammographic asymmetries.  IMPRESSION: No persistent suspicious mammographic or sonographic abnormalities in the areas of the screening study findings.  RECOMMENDATION: Bilateral screening mammogram in 1 year.  I have discussed the findings and recommendations with the patient. If applicable, a reminder letter will be sent to the patient regarding the next  appointment.  BI-RADS CATEGORY  1: Negative.   Electronically Signed   By: Margarette Canada M.D.   On: 12/07/2018 10:49  Results for orders placed or performed in visit on 11/13/19  Novel Coronavirus, NAA ([facility])   Specimen: Nasopharyngeal(NP) swabs in vial transport medium  Result Value Ref Range   SARS-CoV-2, NAA 1490 antibody spike quant   Basic metabolic panel  Result Value Ref Range   Glucose 96    Creatinine 1.4 (A) 0.5 - 1.1  Lipid panel  Result Value Ref Range   Triglycerides 200 (A) 40 - 160   Cholesterol 221 (A) 0 - 200   HDL 54 35 - 70   LDL Cholesterol 132   Hemoglobin A1c  Result Value Ref Range   Hemoglobin A1C 5.7       Assessment & Plan:   Problem List Items Addressed This Visit    OSA on CPAP    Well controlled, chronic OSA on CPAP - Continue current CPAP therapy, patient seems to be benefiting from therapy (still goal to improve weight)      Morbid obesity (HCC)    Weight up 4 labs in 8-9 months Encourage lifestyle diet exercise Limit sodas      Major depressive disorder, recurrent, in partial remission (HCC)    Improved, stable Complicated by fibromyalgia and insomnia, OSA - remains on CPAP - Failed various meds including TCA (Nortriptyline), SSRI (Lexapro, various doses of Zoloft), SNRI (Cymbalta, Venlafaxine) - Not established with Psychiatry / Therapist  Plan: 1. Continue current Sertraline 100mg  daily, continue Wellbutrin  XL 300mg  daily 2. Consider other SSRI/SNRI options if need      Hyperlipidemia    Relatively controlled lipids on current statin Continue Atorvastatin 20mg       Fibromyalgia    Stable chronic problem, related to MDD / insomnia - Failed various meds including Gabapentin, TCA (Nortriptyline), SSRI, SNRI (Cymbalta, Venlafaxine)  Plan: Gabapentin 900 BID, taking 600mg  tab one and half BID Continue current med regimen, Sertraline, Wellbutrin      Chronic kidney disease (CKD), stage III (moderate) (HCC)     Elevated creatinine 1.4, prior 1.2 to 1.3 trend Suspected factors with soda intake and also still regular dosing oral NSAID etolodac had reduced in past but still taking daily dose it seems  Plan Control HTN with med Monitor A1c, in normal vs PreDM range REDUCE Oral NSAID Etolodac now switch to every other day dosing or 1 week on / 1 week off Limit sodas Recheck in 4 months Creatinine lab      Chronic fatigue   Benign hypertension with CKD (chronic kidney disease) stage III    Well-controlled HTN - Home BP readings not checking  CKD-III elevated creatinine on last check, trending up - likely with sodas and NSAIDs    Plan:  1. Continue current BP regimen - Lisinopril 5mg  daily 2. Encourage improved lifestyle - low sodium diet, regular exercise 3. May consider monitor BP outside office, bring readings to next visit, if persistently >140/90 or new symptoms notify office sooner       Other Visit Diagnoses    Annual physical exam    -  Primary   Elevated serum creatinine       Mixed dyslipidemia       Needs flu shot       Relevant Orders   Flu Vaccine QUAD 36+ mos IM (Completed)   Encounter for screening mammogram for malignant neoplasm of breast       Relevant Orders   MM 3D SCREEN BREAST BILATERAL   Colon cancer screening       Relevant Orders   Ambulatory referral to Gastroenterology      No orders of the defined types were placed in this encounter.  Orders Placed This Encounter  Procedures  . MM 3D SCREEN BREAST BILATERAL    Standing Status:   Future    Standing Expiration Date:   05/18/2020    Order Specific Question:   Reason for Exam (SYMPTOM  OR DIAGNOSIS REQUIRED)    Answer:   Screening bilateral 3D Mammogram [place]    Order Specific Question:   Preferred imaging location?    Answer:   [facility] [place]  . Flu Vaccine QUAD 36+ mos IM  . Ambulatory referral to Gastroenterology    Referral Priority:   Routine    Referral Type:   Consultation    Referral Reason:    Specialty Services Required    Number of Visits Requested:   1      Follow up plan: Return in about 4 months (around 03/20/2020) for 4 month follow-up CKD, lab result ([facility]).  Future labs BMET A1c 03/2020  [name], [facility] 11/18/2019, 1:37 PM

## 2019-11-18 NOTE — Assessment & Plan Note (Signed)
Weight up 4 labs in 8-9 months Encourage lifestyle diet exercise Limit sodas

## 2019-11-18 NOTE — Assessment & Plan Note (Signed)
Well controlled, chronic OSA on CPAP - Continue current CPAP therapy, patient seems to be benefiting from therapy (still goal to improve weight)

## 2019-11-18 NOTE — Assessment & Plan Note (Signed)
Improved, stable Complicated by fibromyalgia and insomnia, OSA - remains on CPAP - Failed various meds including TCA (Nortriptyline), SSRI (Lexapro, various doses of Zoloft), SNRI (Cymbalta, Venlafaxine) - Not established with Psychiatry / Therapist  Plan: 1. Continue current Sertraline 100mg  daily, continue Wellbutrin XL 300mg  daily 2. Consider other SSRI/SNRI options if need

## 2019-11-18 NOTE — Assessment & Plan Note (Signed)
Stable chronic problem, related to MDD / insomnia - Failed various meds including Gabapentin, TCA (Nortriptyline), SSRI, SNRI (Cymbalta, Venlafaxine)  Plan: Gabapentin 900 BID, taking 600mg  tab one and half BID Continue current med regimen, Sertraline, Wellbutrin

## 2019-11-18 NOTE — Assessment & Plan Note (Signed)
Relatively controlled lipids on current statin Continue Atorvastatin 20mg 

## 2019-11-18 NOTE — Patient Instructions (Addendum)
Thank you for coming to the office today.  Still have mild elevated Creatinine.  Try to keep limiting Delfino Lovett now try every other day or one week on and one week off.  Also keep limiting diet sodas as discussed.  Recommend to start taking Tylenol Extra Strength 500mg  tabs - take 1 to 2 tabs per dose (max 1000mg ) every 6-8 hours for pain (take regularly, don't skip a dose for next 7 days), max 24 hour daily dose is 6 tablets or 3000mg . In the future you can repeat the same everyday Tylenol course for 1-2 weeks at a time.  - Safe for kidneys.  We can re=-check chemistry kidney in 4 months.  For Mammogram screening for breast cancer   Call the [place] below anytime to schedule your own appointment now that order has been placed.  [facility] [address] [place], [place] [address] Phone: [phone]  --------  Referral for repeat colonoscopy - stay tuned, they will call you  [facility]) [address] [place], [place] [address] Main: [phone]  ---------------------  Last test result COVID antibody August 2021  Component 2 mo ago  SARS-CoV-2, NAA 1490 antibody spike quant        Goal to have antibody >1000 for best protection Maybe can get dose of booster within 1-2 months from now If you want to check antibody level again, let me know we can order.  COVID Vaccine Information  You can get booster dose at [facility], [facility] - check their websites or call to schedule in advance. Appointment is required.  [place] Vaccine Information  [email or web address]  Appointments are required. To register for your free vaccination appointment, click the link on the date on the calendar located on the website or call [phone].  NOTICE ON BOOSTERS Chief Operating Officer Now Available to Eligible Populations [place] is now offering at all [employer]  vaccination clinics a Pfizer booster dose to the following eligible populations, six months after receiving the second dose of the Pfizer vaccine:  People ages 51 and older and residents in long-term care settings should receive it. People ages 3 to 60 with underlying medical conditions should receive it. People ages 11 to 21 with underlying medical conditions may receive it, based on individual benefits and risks People ages 5 to 62 who are at increased risk of occupational exposure and transmission, such as health care workers, may receive it, based on individual benefits and risks Appointments are required. To register, click the link on the date in the calendar above or call [phone], Monday-Friday, 7 a.m.-7 p.m.    Please schedule a Follow-up Appointment to: Return in about 4 months (around 03/20/2020) for 4 month follow-up CKD, lab result ([facility]).  If you have any other questions or concerns, please feel free to call the office or send a message through [facility]. You may also schedule an earlier appointment if necessary.  Additionally, you may be receiving a survey about your experience at our office within a few days to 1 week by e-mail or mail. We value your feedback.  [name], DO [place]

## 2020-01-09 ENCOUNTER — Other Ambulatory Visit: Payer: Self-pay | Admitting: Family Medicine

## 2020-01-09 DIAGNOSIS — M797 Fibromyalgia: Secondary | ICD-10-CM

## 2020-01-09 DIAGNOSIS — E782 Mixed hyperlipidemia: Secondary | ICD-10-CM

## 2020-01-09 DIAGNOSIS — F331 Major depressive disorder, recurrent, moderate: Secondary | ICD-10-CM

## 2020-01-09 DIAGNOSIS — F5105 Insomnia due to other mental disorder: Secondary | ICD-10-CM

## 2020-01-09 DIAGNOSIS — F99 Mental disorder, not otherwise specified: Secondary | ICD-10-CM

## 2020-01-09 DIAGNOSIS — I10 Essential (primary) hypertension: Secondary | ICD-10-CM

## 2020-01-09 MED ORDER — ETODOLAC 500 MG PO TABS: 500.0000 mg | ORAL_TABLET | Freq: Every day | ORAL | 3 refills | Status: DC

## 2020-01-23 ENCOUNTER — Encounter: Payer: Self-pay | Admitting: Gastroenterology

## 2020-01-23 ENCOUNTER — Ambulatory Visit (INDEPENDENT_AMBULATORY_CARE_PROVIDER_SITE_OTHER): Payer: Managed Care, Other (non HMO) | Admitting: Gastroenterology

## 2020-01-23 ENCOUNTER — Other Ambulatory Visit: Payer: Self-pay

## 2020-01-23 VITALS — BP 128/79 | HR 84 | Temp 97.7°F | Ht 61.0 in | Wt 229.2 lb

## 2020-01-23 DIAGNOSIS — Z86010 Personal history of colonic polyps: Secondary | ICD-10-CM

## 2020-01-23 DIAGNOSIS — K640 First degree hemorrhoids: Secondary | ICD-10-CM | POA: Diagnosis not present

## 2020-01-23 DIAGNOSIS — K529 Noninfective gastroenteritis and colitis, unspecified: Secondary | ICD-10-CM | POA: Diagnosis not present

## 2020-01-23 MED ORDER — NA SULFATE-K SULFATE-MG SULF 17.5-3.13-1.6 GM/177ML PO SOLN: 354.0000 mL | Freq: Once | ORAL | 0 refills | Status: AC

## 2020-01-23 NOTE — Progress Notes (Signed)
PROCEDURE NOTE: The patient presents with symptomatic grade 1 hemorrhoids, unresponsive to maximal medical therapy, requesting rubber band ligation of his/her hemorrhoidal disease.  All risks, benefits and alternative forms of therapy were described and informed consent was obtained.  In the Left Lateral Decubitus position (if anoscopy is performed) anoscopic examination revealed grade 1 hemorrhoids in the all position(s).   The decision was made to band the RP internal hemorrhoid, and the [facility] O'Regan System was used to perform band ligation without complication.  Digital anorectal examination was then performed to assure proper positioning of the band, and to adjust the banded tissue as required.  The patient was discharged home without pain or other issues.  Dietary and behavioral recommendations were given and (if necessary - prescriptions were given), along with follow-up instructions.  The patient will return 4 weeks for follow-up and possible additional banding as required.  No complications were encountered and the patient tolerated the procedure well.   

## 2020-01-23 NOTE — Progress Notes (Signed)
[name], MD [address]  [place]  [place], [place] [address]  Main: [phone]  Fax: [phone]    Gastroenterology Consultation  Referring Provider:     [name] * Primary Care Physician:  Olin Hauser, DO Primary Gastroenterologist:  Dr. [name] Reason for Consultation:     Chronic diarrhea and symptomatic hemorrhoids        HPI:   Rachel Townsend is a 59 y.o. female referred by Dr. Parks Ranger, Devonne Doughty, DO  for consultation & management of chronic diarrhea and symptomatic hemorrhoids.  Patient has history of metabolic syndrome, has been experiencing several years history, that she could recall of increased bowel frequency, nonbloody diarrhea, soft mushy stool to watery bowel movements associated with urgency.  She denies any fecal incontinence or nocturnal diarrhea.  Her bowel movements occur usually during the day after a meal.  She states that she has been consuming diet soda since age of 32, stopped carbonated beverages when she found out it was stage III CKD.  She does consume artificial sweeteners.  She has been gaining weight.  TSH is normal, CBC, CMP are unremarkable.  She has also been experiencing several years history of hemorrhoidal symptoms including perianal itching, bleeding or soiling, burning, difficulty cleaning after a bowel movement.  She finds over-the-counter hemorrhoid suppository to provide relief of the symptoms.  But these are recurrent.  She denies any abdominal pain, abdominal bloating, nausea or vomiting.  She has quit smoking 33 years ago.  She does drink alcohol about 10 drinks per week, anywhere from [place] to red wine or eggnog  NSAIDs: None  Antiplts/Anticoagulants/Anti thrombotics: None  GI Procedures: Colonoscopy 6 years ago reportedly in [facility], found to have precancerous polyp and she was told she is due now No known family history of colon cancer  Past Medical History:   Diagnosis Date  . Allergy   . Fibromyalgia   . Hypertension   . Wrist pain     Past Surgical History:  Procedure Laterality Date  . BREAST BIOPSY Left    core- neg  . BREAST BIOPSY Left    stereo- neg  . ECTOPIC PREGNANCY SURGERY    . FOOT SURGERY    . ORIF RADIAL FRACTURE Right 02/18/2019   Procedure: OPEN REDUCTION INTERNAL FIXATION (ORIF) RADIAL FRACTURE;  Surgeon: Earnestine Leys, MD;  Location: [facility] ORS;  Service: Orthopedics;  Laterality: Right;    Current Outpatient Medications:  .  atorvastatin (LIPITOR) 20 MG tablet, Take 1 tablet (20 mg total) by mouth daily., Disp: 90 tablet, Rfl: 3 .  b complex vitamins tablet, Take 1 tablet by mouth daily., Disp: , Rfl:  .  buPROPion (WELLBUTRIN XL) 300 MG 24 hr tablet, TAKE 1 TABLET BY MOUTH  DAILY, Disp: 90 tablet, Rfl: 0 .  Calcium-Vitamin D 600-200 MG-UNIT tablet, Take 1 tablet by mouth 2 (two) times daily. , Disp: , Rfl:  .  Cholecalciferol 25 MCG (1000 UT) tablet, Take 1,000 Units by mouth daily., Disp: , Rfl:  .  Co-Enzyme Q-10 100 MG CAPS, Take 100 mg by mouth daily. , Disp: , Rfl:  .  gabapentin (NEURONTIN) 600 MG tablet, TAKE 1 AND 1/2 TABLETS BY  MOUTH TWICE DAILY, Disp: 270 tablet, Rfl: 1 .  lisinopril (ZESTRIL) 5 MG tablet, TAKE 1 TABLET BY MOUTH  DAILY, Disp: 90 tablet, Rfl: 3 .  loratadine (CLARITIN) 10 MG tablet, Take 10 mg by mouth daily. , Disp: , Rfl:  .  Magnesium (MAGNACAPS) 100 MG CAPS, Take 200 mg by mouth 2 (two) times daily., Disp: , Rfl:  .  niacin 500 MG tablet, Take 500 mg by mouth daily. , Disp: , Rfl:  .  sertraline (ZOLOFT) 100 MG tablet, TAKE 1 TABLET BY MOUTH  DAILY, Disp: 90 tablet, Rfl: 0 .  Na Sulfate-K Sulfate-Mg Sulf 17.5-3.13-1.6 GM/177ML SOLN, Take 354 mLs by mouth once for 1 dose., Disp: 354 mL, Rfl: 0   Family History  Problem Relation Age of Onset  . Hypertension Mother   . Diabetes Mother   . Hyperlipidemia Mother   . Cancer Mother   . Breast cancer Mother 58  . Hypertension Father    . Hyperlipidemia Father      Social History   Tobacco Use  . Smoking status: Former Smoker    Quit date: 11/23/1984    Years since quitting: 35.1  . Smokeless tobacco: Former Network engineer  . Vaping Use: Never used  Substance Use Topics  . Alcohol use: Yes    Alcohol/week: 10.0 standard drinks    Types: 10 Glasses of wine per week    Comment: weekly  . Drug use: No    Allergies as of 01/23/2020  . (No Known Allergies)    Review of Systems:    All systems reviewed and negative except where noted in HPI.   Physical Exam:  BP 128/79 (BP Location: Left Arm, Patient Position: Sitting, Cuff Size: Normal)   Pulse 84   Temp 97.7 F (36.5 C) (Oral)   Ht 5\' 1"  (1.549 m)   Wt 229 lb 4 oz (104 kg)   BMI 43.32 kg/m  No LMP recorded. Patient is postmenopausal.  General:   Alert,  Well-developed, well-nourished, pleasant and cooperative in NAD Head:  Normocephalic and atraumatic. Eyes:  Sclera clear, no icterus.   Conjunctiva pink. Ears:  Normal auditory acuity. Nose:  No deformity, discharge, or lesions. Mouth:  No deformity or lesions,oropharynx pink & moist. Neck:  Supple; no masses or thyromegaly. Lungs:  Respirations even and unlabored.  Clear throughout to auscultation.   No wheezes, crackles, or rhonchi. No acute distress. Heart:  Regular rate and rhythm; no murmurs, clicks, rubs, or gallops. Abdomen:  Normal bowel sounds. Soft, non-tender and non-distended without masses, hepatosplenomegaly or hernias noted.  No guarding or rebound tenderness.   Rectal: Not performed Msk:  Symmetrical without gross deformities. Good, equal movement & strength bilaterally. Pulses:  Normal pulses noted. Extremities:  No clubbing or edema.  No cyanosis. Neurologic:  Alert and oriented x3;  grossly normal neurologically. Skin:  Intact without significant lesions or rashes. No jaundice. Psych:  Alert and cooperative. Normal mood and affect.  Imaging Studies: No abdominal  imaging  Assessment and Plan:   Rachel Townsend is a 59 y.o. pleasant Caucasian female with history of metabolic syndrome is seen in consultation for chronic nonbloody diarrhea without any other constitutional symptoms, grade 1 symptomatic external hemorrhoids  Chronic diarrhea, likely osmotic diarrhea secondary to intake of diet soda Strongly urged to eliminate all the carbonated beverages and artificial sweeteners Recommend colonoscopy with TI evaluation and biopsies to evaluate for IBD/microscopic colitis If colonoscopy is unremarkable, will pursue further work-up, evaluate for celiac disease, pancreatic insufficiency etc  Symptomatic external hemorrhoids, grade 1 Discussed about hemorrhoid ligation including risks and benefits, consent obtained Patient would like to proceed with hemorrhoid ligation today   Follow up in 4 to 6 weeks after colonoscopy   [name], MD

## 2020-02-10 ENCOUNTER — Encounter: Payer: Self-pay | Admitting: Gastroenterology

## 2020-02-18 ENCOUNTER — Other Ambulatory Visit
Admission: RE | Admit: 2020-02-18 | Discharge: 2020-02-18 | Disposition: A | Discharge status: Home / Self Care | Payer: Managed Care, Other (non HMO) | Source: Ambulatory Visit | Attending: Gastroenterology | Admitting: Gastroenterology

## 2020-02-18 ENCOUNTER — Other Ambulatory Visit: Payer: Self-pay

## 2020-02-18 DIAGNOSIS — Z20822 Contact with and (suspected) exposure to covid-19: Secondary | ICD-10-CM | POA: Diagnosis not present

## 2020-02-18 DIAGNOSIS — Z01812 Encounter for preprocedural laboratory examination: Secondary | ICD-10-CM | POA: Insufficient documentation

## 2020-02-18 LAB — SARS CORONAVIRUS 2 (TAT 6-24 HRS): SARS Coronavirus 2: NEGATIVE

## 2020-02-19 NOTE — Discharge Instructions (Signed)

## 2020-02-20 ENCOUNTER — Ambulatory Visit
Admission: RE | Admit: 2020-02-20 | Discharge: 2020-02-20 | Disposition: A | Discharge status: Home / Self Care | Payer: Managed Care, Other (non HMO) | Attending: Gastroenterology | Admitting: Gastroenterology

## 2020-02-20 ENCOUNTER — Other Ambulatory Visit: Payer: Self-pay

## 2020-02-20 ENCOUNTER — Ambulatory Visit: Payer: Managed Care, Other (non HMO) | Admitting: Anesthesiology

## 2020-02-20 ENCOUNTER — Encounter
Admission: RE | Disposition: A | Discharge status: Home / Self Care | Payer: Self-pay | Source: Home / Self Care | Attending: Gastroenterology

## 2020-02-20 ENCOUNTER — Encounter: Payer: Self-pay | Admitting: Gastroenterology

## 2020-02-20 DIAGNOSIS — K644 Residual hemorrhoidal skin tags: Secondary | ICD-10-CM | POA: Diagnosis not present

## 2020-02-20 DIAGNOSIS — K635 Polyp of colon: Secondary | ICD-10-CM | POA: Insufficient documentation

## 2020-02-20 DIAGNOSIS — D124 Benign neoplasm of descending colon: Secondary | ICD-10-CM | POA: Insufficient documentation

## 2020-02-20 DIAGNOSIS — Z87891 Personal history of nicotine dependence: Secondary | ICD-10-CM | POA: Diagnosis not present

## 2020-02-20 DIAGNOSIS — Z86010 Personal history of colonic polyps: Secondary | ICD-10-CM | POA: Insufficient documentation

## 2020-02-20 DIAGNOSIS — Z79899 Other long term (current) drug therapy: Secondary | ICD-10-CM | POA: Insufficient documentation

## 2020-02-20 DIAGNOSIS — Z1211 Encounter for screening for malignant neoplasm of colon: Secondary | ICD-10-CM | POA: Diagnosis present

## 2020-02-20 HISTORY — PX: BIOPSY: SHX5522

## 2020-02-20 HISTORY — DX: Prediabetes: R73.03

## 2020-02-20 HISTORY — DX: Sleep apnea, unspecified: G47.30

## 2020-02-20 HISTORY — DX: Family history of other specified conditions: Z84.89

## 2020-02-20 HISTORY — DX: Chronic kidney disease, stage 3 unspecified: N18.30

## 2020-02-20 HISTORY — PX: COLONOSCOPY WITH PROPOFOL: SHX5780

## 2020-02-20 HISTORY — PX: POLYPECTOMY: SHX5525

## 2020-02-20 SURGERY — COLONOSCOPY WITH PROPOFOL
Anesthesia: General | Site: Rectum

## 2020-02-20 MED ORDER — MIDAZOLAM HCL 2 MG/2ML IJ SOLN
INTRAMUSCULAR | Status: DC | PRN
  Administered 2020-02-20 (×2): 1 mg via INTRAVENOUS

## 2020-02-20 MED ORDER — SODIUM CHLORIDE 0.9 % IV SOLN: INTRAVENOUS | Status: DC

## 2020-02-20 MED ORDER — PROPOFOL 10 MG/ML IV BOLUS
INTRAVENOUS | Status: DC | PRN
  Administered 2020-02-20: 40 mg via INTRAVENOUS

## 2020-02-20 MED ORDER — STERILE WATER FOR IRRIGATION IR SOLN
Status: DC | PRN
  Administered 2020-02-20: 100 mL

## 2020-02-20 MED ORDER — LACTATED RINGERS IV SOLN: INTRAVENOUS | Status: DC | PRN

## 2020-02-20 MED ORDER — PROPOFOL 500 MG/50ML IV EMUL
INTRAVENOUS | Status: DC | PRN
  Administered 2020-02-20: 140 ug/kg/min via INTRAVENOUS

## 2020-02-20 SURGICAL SUPPLY — 26 items
CLIP HMST 235XBRD CATH ROT (MISCELLANEOUS) ×2 IMPLANT
CLIP RESOLUTION 360 11X235 (MISCELLANEOUS) ×3
ELECT REM PT RETURN 9FT ADLT (ELECTROSURGICAL)
ELECTRODE REM PT RTRN 9FT ADLT (ELECTROSURGICAL) IMPLANT
FCP ESCP3.2XJMB 240X2.8X (MISCELLANEOUS) ×2
FORCEPS BIOP RAD 4 LRG CAP 4 (CUTTING FORCEPS) IMPLANT
FORCEPS BIOP RJ4 240 W/NDL (MISCELLANEOUS) ×3
FORCEPS ESCP3.2XJMB 240X2.8X (MISCELLANEOUS) ×2 IMPLANT
GOWN CVR UNV OPN BCK APRN NK (MISCELLANEOUS) ×4 IMPLANT
GOWN ISOL THUMB LOOP REG UNIV (MISCELLANEOUS) ×6
INJECTOR VARIJECT VIN23 (MISCELLANEOUS) IMPLANT
KIT DEFENDO VALVE AND CONN (KITS) IMPLANT
KIT PRC NS LF DISP ENDO (KITS) ×2 IMPLANT
KIT PROCEDURE OLYMPUS (KITS) ×3
MANIFOLD NEPTUNE II (INSTRUMENTS) ×3 IMPLANT
MARKER SPOT ENDO TATTOO 5ML (MISCELLANEOUS) IMPLANT
PROBE APC STR FIRE (PROBE) IMPLANT
RETRIEVER NET ROTH 2.5X230 LF (MISCELLANEOUS) IMPLANT
SNARE COLD EXACTO (MISCELLANEOUS) ×3 IMPLANT
SNARE SHORT THROW 13M SML OVAL (MISCELLANEOUS) IMPLANT
SNARE SHORT THROW 30M LRG OVAL (MISCELLANEOUS) IMPLANT
SNARE SNG USE RND 15MM (INSTRUMENTS) IMPLANT
SPOT EX ENDOSCOPIC TATTOO (MISCELLANEOUS)
TRAP ETRAP POLY (MISCELLANEOUS) ×3 IMPLANT
VARIJECT INJECTOR VIN23 (MISCELLANEOUS)
WATER STERILE IRR 250ML POUR (IV SOLUTION) ×3 IMPLANT

## 2020-02-20 NOTE — Anesthesia Postprocedure Evaluation (Addendum)
Anesthesia Post Note  Patient: Rachel Townsend  Procedure(s) Performed: COLONOSCOPY WITH PROPOFOL (N/A ) BIOPSY (Rectum) POLYPECTOMY (Rectum)     Patient location during evaluation: PACU Anesthesia Type: General Level of consciousness: awake and alert Pain management: pain level controlled Vital Signs Assessment: post-procedure vital signs reviewed and stable Respiratory status: spontaneous breathing, nonlabored ventilation, respiratory function stable and patient connected to nasal cannula oxygen Cardiovascular status: blood pressure returned to baseline and stable Postop Assessment: no apparent nausea or vomiting Anesthetic complications: no   No complications documented.  Sinda Du

## 2020-02-20 NOTE — Anesthesia Preprocedure Evaluation (Signed)
Anesthesia Evaluation  Patient identified by MRN, date of birth, ID band Patient awake    Reviewed: Allergy & Precautions, NPO status , Patient's Chart, lab work & pertinent test results  History of Anesthesia Complications Negative for: history of anesthetic complications  Airway Mallampati: III  TM Distance: >3 FB Neck ROM: Full    Dental no notable dental hx.    Pulmonary sleep apnea and Continuous Positive Airway Pressure Ventilation , neg COPD, Not current smoker, former smoker,    Pulmonary exam normal breath sounds clear to auscultation       Cardiovascular Exercise Tolerance: Good hypertension, Pt. on medications (-) Past MI and (-) CHF Normal cardiovascular exam(-) dysrhythmias (-) Valvular Problems/Murmurs Rhythm:Regular Rate:Normal     Neuro/Psych neg Seizures PSYCHIATRIC DISORDERS Depression  Neuromuscular disease    GI/Hepatic Neg liver ROS, neg GERD  ,  Endo/Other  neg diabetesMorbid obesity  Renal/GU Renal InsufficiencyRenal disease     Musculoskeletal  (+) Fibromyalgia -  Abdominal (+) + obese,   Peds  Hematology   Anesthesia Other Findings   Reproductive/Obstetrics                             Anesthesia Physical  Anesthesia Plan  ASA: III  Anesthesia Plan: General   Post-op Pain Management:    Induction: Intravenous  PONV Risk Score and Plan: 3 and Treatment may vary due to age or medical condition  Airway Management Planned: Natural Airway and Nasal Cannula  Additional Equipment:   Intra-op Plan:   Post-operative Plan:   Informed Consent: I have reviewed the patients History and Physical, chart, labs and discussed the procedure including the risks, benefits and alternatives for the proposed anesthesia with the patient or authorized representative who has indicated his/her understanding and acceptance.     Dental advisory given  Plan Discussed with:  CRNA and Anesthesiologist  Anesthesia Plan Comments:         Anesthesia Quick Evaluation  Patient Active Problem List   Diagnosis Date Noted  . Chronic kidney disease (CKD), stage III (moderate) ([place]) 10/30/2018  . De Quervain's tenosynovitis, right 08/08/2016  . Hyperlipidemia 05/24/2016  . OSA on CPAP 05/04/2016  . Chronic fatigue 08/17/2015  . Sciatica 05/14/2015  . Morbid obesity ([place]) 03/02/2015  . Fibromyalgia 07/16/2014  . Major depressive disorder, recurrent, in partial remission ([place]) 07/16/2014  . Insomnia 07/16/2014  . Benign hypertension with CKD (chronic kidney disease) stage III 07/16/2014    CBC Latest Ref Rng & Units 10/24/2018 11/14/2016 08/18/2015  WBC 3.4 - 10.8 x10E3/uL 7.7 5.5 5.7  Hemoglobin 11.1 - 15.9 g/dL 13.5 13.6 12.7  Hematocrit 34.0 - 46.6 % 41.8 39.8 38.4  Platelets 150 - 450 x10E3/uL 278 293 284   BMP Latest Ref Rng & Units 09/05/2019 11/23/2018 10/24/2018  Glucose 65 - 99 mg/dL - 115(H) 102(H)  BUN 6 - 24 mg/dL - 15 19  Creatinine 0.5 - 1.1 1.4(A) 1.28(H) 1.30(H)  BUN/Creat Ratio 9 - 23 - 12 15  Sodium 134 - 144 mmol/L - 137 139  Potassium 3.5 - 5.2 mmol/L - 4.3 4.8  Chloride 96 - 106 mmol/L - 102 102  CO2 20 - 29 mmol/L - 20 25  Calcium 8.7 - 10.2 mg/dL - 9.5 9.8    Risks and benefits of anesthesia discussed at length, patient or surrogate demonstrates understanding. Appropriately NPO. Plan to proceed with anesthesia.  Champ Mungo, MD 02/20/20

## 2020-02-20 NOTE — Transfer of Care (Signed)
Immediate Anesthesia Transfer of Care Note  Patient: Rachel Townsend  Procedure(s) Performed: COLONOSCOPY WITH PROPOFOL (N/A ) BIOPSY (Rectum) POLYPECTOMY (Rectum)  Patient Location: PACU  Anesthesia Type: General  Level of Consciousness: awake, alert  and patient cooperative  Airway and Oxygen Therapy: Patient Spontanous Breathing and Patient connected to supplemental oxygen  Post-op Assessment: Post-op Vital signs reviewed, Patient's Cardiovascular Status Stable, Respiratory Function Stable, Patent Airway and No signs of Nausea or vomiting  Post-op Vital Signs: Reviewed and stable  Complications: No complications documented.

## 2020-02-20 NOTE — H&P (Signed)
[name], MD [address]  [place]  [place], [place] [address]  Main: [phone]  Fax: [phone] Pager: [phone]  Primary Care Physician:  Olin Hauser, DO Primary Gastroenterologist:  Dr. [name]  Pre-Procedure History & Physical: HPI:  Rachel Townsend is a 60 y.o. female is here for an colonoscopy.   Past Medical History:  Diagnosis Date  . Allergy   . Family history of adverse reaction to anesthesia    sister- PONV  . Fibromyalgia   . Hypertension   . Pre-diabetes   . Sleep apnea    uses CPAP  . Stage III chronic kidney disease ([place])   . Wrist pain     Past Surgical History:  Procedure Laterality Date  . BREAST BIOPSY Left    core- neg  . BREAST BIOPSY Left    stereo- neg  . ECTOPIC PREGNANCY SURGERY    . FOOT SURGERY Left   . ORIF RADIAL FRACTURE Right 02/18/2019   Procedure: OPEN REDUCTION INTERNAL FIXATION (ORIF) RADIAL FRACTURE;  Surgeon: Earnestine Leys, MD;  Location: [facility] ORS;  Service: Orthopedics;  Laterality: Right;    Prior to Admission medications   Medication Sig Start Date End Date Taking? Authorizing Provider  atorvastatin (LIPITOR) 20 MG tablet Take 1 tablet (20 mg total) by mouth daily. 01/09/20  Yes Karamalegos, Devonne Doughty, DO  b complex vitamins tablet Take 1 tablet by mouth daily.   Yes [provider]  buPROPion (WELLBUTRIN XL) 300 MG 24 hr tablet TAKE 1 TABLET BY MOUTH  DAILY 01/09/20  Yes Karamalegos, Devonne Doughty, DO  Calcium-Vitamin D 600-200 MG-UNIT tablet Take 1 tablet by mouth 2 (two) times daily.    Yes [provider]  Cholecalciferol 25 MCG (1000 UT) tablet Take 1,000 Units by mouth daily.   Yes [provider]  Co-Enzyme Q-10 100 MG CAPS Take 100 mg by mouth daily.    Yes [provider]  gabapentin (NEURONTIN) 600 MG tablet TAKE 1 AND 1/2 TABLETS BY  MOUTH TWICE DAILY 01/09/20  Yes Karamalegos, Alexander J, DO  lisinopril (ZESTRIL) 5 MG tablet TAKE 1 TABLET BY  MOUTH  DAILY 01/09/20  Yes Karamalegos, Devonne Doughty, DO  loratadine (CLARITIN) 10 MG tablet Take 10 mg by mouth daily.    Yes [provider]  Magnesium (MAGNACAPS) 100 MG CAPS Take 200 mg by mouth 2 (two) times daily.   Yes [provider]  niacin 500 MG tablet Take 500 mg by mouth daily.    Yes [provider]  sertraline (ZOLOFT) 100 MG tablet TAKE 1 TABLET BY MOUTH  DAILY 01/09/20  Yes Karamalegos, Devonne Doughty, DO  Icosapent Ethyl (VASCEPA) 1 g CAPS Take 2 capsules (2 g total) by mouth 2 (two) times daily. 10/30/18 01/21/19  Mikey College, NP    Allergies as of 01/23/2020  . (No Known Allergies)    Family History  Problem Relation Age of Onset  . Hypertension Mother   . Diabetes Mother   . Hyperlipidemia Mother   . Cancer Mother   . Breast cancer Mother 25  . Hypertension Father   . Hyperlipidemia Father     Social History   Socioeconomic History  . Marital status: Married    Spouse name: Not on file  . Number of children: Not on file  . Years of education: Not on file  . Highest education level: Not on file  Occupational History  . 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  Smoking status: Former Smoker    Quit date: 11/23/1984    Years since quitting: 35.2  . Smokeless tobacco: Former Network engineer  . Vaping Use: Never used  Substance and Sexual Activity  . Alcohol use: Yes    Alcohol/week: 10.0 standard drinks    Types: 10 Glasses of wine per week    Comment: weekly  . Drug use: No  . Sexual activity: Yes    Birth control/protection: Injection  Other Topics Concern  . Not on file  Social History Narrative  . Not on file   Social Determinants of Health   Financial Resource Strain: Not on file  Food Insecurity: Not on file  Transportation Needs: Not on file  Physical Activity: Not on file  Stress: Not on file  Social Connections: Not on file  Intimate Partner Violence: Not on file    Review of  Systems: See HPI, otherwise negative ROS  Physical Exam: BP 116/77   Pulse 73   Temp (!) 97.5 F (36.4 C) (Temporal)   Ht 5\' 2"  (1.575 m)   Wt 102.1 kg   SpO2 98%   BMI 41.15 kg/m  General:   Alert,  pleasant and cooperative in NAD Head:  Normocephalic and atraumatic. Neck:  Supple; no masses or thyromegaly. Lungs:  Clear throughout to auscultation.    Heart:  Regular rate and rhythm. Abdomen:  Soft, nontender and nondistended. Normal bowel sounds, without guarding, and without rebound.   Neurologic:  Alert and  oriented x4;  grossly normal neurologically.  Impression/Plan: Laron Angelini is here for an colonoscopy to be performed for h/o colon polyps  Risks, benefits, limitations, and alternatives regarding  colonoscopy have been reviewed with the patient.  Questions have been answered.  All parties agreeable.   Sherri Sear, MD  02/20/2020, 8:03 AM

## 2020-02-20 NOTE — Op Note (Signed)
[facility] Gastroenterology Patient Name: Rachel Townsend Procedure Date: 02/20/2020 8:46 AM MRN: [id number] Account #: [id number] Date of Birth: [birth year] Admit Type: Outpatient Age: 59 Room: [facility] OR ROOM 01 Gender: Female Note Status: Finalized Procedure:             Colonoscopy Indications:           Surveillance: Personal history of colonic polyps                         (unknown histology) on last colonoscopy more than 5                         years ago, Last colonoscopy: February 2016 Providers:             Lin Landsman MD, MD Referring MD:          Olin Hauser (Referring MD) Medicines:             General Anesthesia Complications:         No immediate complications. Estimated blood loss: None. Procedure:             Pre-Anesthesia Assessment:                        - Prior to the procedure, a History and Physical was                         performed, and patient medications and allergies were                         reviewed. The patient is competent. The risks and                         benefits of the procedure and the sedation options and                         risks were discussed with the patient. All questions                         were answered and informed consent was obtained.                         Patient identification and proposed procedure were                         verified by the physician, the nurse, the                         anesthesiologist, the anesthetist and the technician                         in the pre-procedure area in the procedure room in the                         endoscopy suite. Mental Status Examination: alert and                         oriented. Airway Examination: normal oropharyngeal  airway and neck mobility. Respiratory Examination:                         clear to auscultation. CV Examination: normal.                         Prophylactic Antibiotics: The patient  does not require                         prophylactic antibiotics. Prior Anticoagulants: The                         patient has taken no previous anticoagulant or                         antiplatelet agents. ASA Grade Assessment: III - A                         patient with severe systemic disease. After reviewing                         the risks and benefits, the patient was deemed in                         satisfactory condition to undergo the procedure. The                         anesthesia plan was to use general anesthesia.                         Immediately prior to administration of medications,                         the patient was re-assessed for adequacy to receive                         sedatives. The heart rate, respiratory rate, oxygen                         saturations, blood pressure, adequacy of pulmonary                         ventilation, and response to care were monitored                         throughout the procedure. The physical status of the                         patient was re-assessed after the procedure.                        After obtaining informed consent, the colonoscope was                         passed under direct vision. Throughout the procedure,                         the patient's blood pressure, pulse, and oxygen  saturations were monitored continuously. The was                         introduced through the anus and advanced to the the                         terminal ileum, with identification of the appendiceal                         orifice and IC valve. The colonoscopy was performed                         without difficulty. The patient tolerated the                         procedure well. The quality of the bowel preparation                         was evaluated using the BBPS [facility] Bowel Preparation                         Scale) with scores of: Right Colon = 3, Transverse                         Colon  = 3 and Left Colon = 3 (entire mucosa seen well                         with no residual staining, small fragments of stool or                         opaque liquid). The total BBPS score equals 9. Findings:      Skin tags were found on perianal exam.      The terminal ileum appeared normal.      Two sessile polyps were found in the sigmoid colon and descending colon.       The polyps were 3 to 5 mm in size. These polyps were removed with a cold       snare. Resection and retrieval were complete.      Normal mucosa was found in the entire colon. Biopsies for histology were       taken with a cold forceps from the entire colon for evaluation of       microscopic colitis. To prevent bleeding after the biopsy, one       hemostatic clip was successfully placed (MR conditional). There was no       bleeding at the end of the procedure.      The retroflexed view of the distal rectum and anal verge was normal and       showed no anal or rectal abnormalities. Impression:            - Perianal skin tags found on perianal exam.                        - The examined portion of the ileum was normal.                        - Two 3 to 5 mm polyps in the sigmoid colon  and in the                         descending colon, removed with a cold snare. Resected                         and retrieved.                        - Normal mucosa in the entire examined colon.                         Biopsied. Clip (MR conditional) was placed.                        - The distal rectum and anal verge are normal on                         retroflexion view. Recommendation:        - Discharge patient to home (with escort).                        - Resume previous diet today.                        - Continue present medications.                        - Await pathology results.                        - Repeat colonoscopy in 5-10 years for surveillance                         based on pathology results. Procedure  Code(s):     --- Professional ---                        [phone], Colonoscopy, flexible; with removal of                         tumor(s), polyp(s), or other lesion(s) by snare                         technique                        45380, 10, Colonoscopy, flexible; with biopsy, single                         or multiple Diagnosis Code(s):     --- Professional ---                        Z86.010, Personal history of colonic polyps                        K63.5, Polyp of colon                        K64.4, Residual hemorrhoidal skin tags CPT copyright 2019 American Medical Association. All rights reserved. The codes documented in this report are preliminary and upon  coder review may  be revised to meet current compliance requirements. Dr. Ulyess Mort Lin Landsman MD, MD 02/20/2020 9:34:31 AM This report has been signed electronically. Number of Addenda: 0 Note Initiated On: 02/20/2020 8:46 AM Scope Withdrawal Time: 0 hours 25 minutes 35 seconds  Total Procedure Duration: 0 hours 29 minutes 35 seconds  Estimated Blood Loss:  Estimated blood loss: none.      [facility]

## 2020-02-20 NOTE — Anesthesia Procedure Notes (Signed)
Date/Time: 02/20/2020 8:55 AM Performed by: Dionne Bucy, CRNA Pre-anesthesia Checklist: Patient identified, Emergency Drugs available, Suction available, Patient being monitored and Timeout performed Patient Re-evaluated:Patient Re-evaluated prior to induction Oxygen Delivery Method: Nasal cannula Placement Confirmation: positive ETCO2

## 2020-02-21 ENCOUNTER — Encounter: Payer: Self-pay | Admitting: Gastroenterology

## 2020-02-24 LAB — SURGICAL PATHOLOGY

## 2020-02-26 ENCOUNTER — Encounter: Payer: Self-pay | Admitting: Gastroenterology

## 2020-03-06 ENCOUNTER — Other Ambulatory Visit: Payer: Self-pay

## 2020-03-06 ENCOUNTER — Ambulatory Visit (INDEPENDENT_AMBULATORY_CARE_PROVIDER_SITE_OTHER): Payer: Managed Care, Other (non HMO) | Admitting: Gastroenterology

## 2020-03-06 ENCOUNTER — Encounter: Payer: Self-pay | Admitting: Gastroenterology

## 2020-03-06 VITALS — BP 122/78 | HR 70 | Temp 97.8°F | Ht 61.0 in | Wt 230.0 lb

## 2020-03-06 DIAGNOSIS — K529 Noninfective gastroenteritis and colitis, unspecified: Secondary | ICD-10-CM

## 2020-03-06 DIAGNOSIS — K640 First degree hemorrhoids: Secondary | ICD-10-CM | POA: Diagnosis not present

## 2020-03-06 NOTE — Progress Notes (Signed)
PROCEDURE NOTE: °The patient presents with symptomatic grade 1 hemorrhoids, unresponsive to maximal medical therapy, requesting rubber band ligation of his/her hemorrhoidal disease.  All risks, benefits and alternative forms of therapy were described and informed consent was obtained. ° °The decision was made to band the RA internal hemorrhoid, and the [facility] System was used to perform band ligation without complication.  Digital anorectal examination was then performed to assure proper positioning of the band, and to adjust the banded tissue as required.  The patient was discharged home without pain or other issues.  Dietary and behavioral recommendations were given and (if necessary - prescriptions were given), along with follow-up instructions.  The patient will return 4 weeks for follow-up and possible additional banding as required. ° °No complications were encountered and the patient tolerated the procedure well. ° ° ° °

## 2020-03-06 NOTE — Progress Notes (Signed)
[name], MD [address]  [place]  [place], [place] [address]  Main: [phone]  Fax: [phone]    Gastroenterology Consultation  Referring Provider:     Nobie Townsend * Primary Care Physician:  Rachel Hauser, DO Primary Gastroenterologist:  Dr. [name] Reason for Consultation:     Chronic diarrhea and symptomatic hemorrhoids        HPI:   Rachel Townsend is a 60 y.o. female referred by Dr. Parks Townsend, Rachel Doughty, DO  for consultation & management of chronic diarrhea and symptomatic hemorrhoids.  Patient has history of metabolic syndrome, has been experiencing several years history, that she could recall of increased bowel frequency, nonbloody diarrhea, soft mushy stool to watery bowel movements associated with urgency.  She denies any fecal incontinence or nocturnal diarrhea.  Her bowel movements occur usually during the day after a meal.  She states that she has been consuming diet soda since age of 6, stopped carbonated beverages when she found out it was stage III CKD.  She does consume artificial sweeteners.  She has been gaining weight.  TSH is normal, CBC, CMP are unremarkable.  She has also been experiencing several years history of hemorrhoidal symptoms including perianal itching, bleeding or soiling, burning, difficulty cleaning after a bowel movement.  She finds over-the-counter hemorrhoid suppository to provide relief of the symptoms.  But these are recurrent.  She denies any abdominal pain, abdominal bloating, nausea or vomiting.  She has quit smoking 33 years ago.  She does drink alcohol about 10 drinks per week, anywhere from [facility] to red wine or eggnog  Follow-up visit 03-06-20 Patient reports that she had 3 episodes of diarrhea last week.  She underwent colonoscopy, found to have subcentimeter adenomatous polyp only.  There was no evidence of IBD or microscopic colitis.  She has significantly cut back on soda to 1 medium size  can a day.  She reports that her hemorrhoidal symptoms have significantly improved after the first banding.  She is here to undergo second banding   NSAIDs: None  Antiplts/Anticoagulants/Anti thrombotics: None  GI Procedures: Colonoscopy 6 years ago reportedly in [facility], found to have precancerous polyp and she was told she is due now No known family history of colon cancer  Colonoscopy 02/20/2020 DIAGNOSIS:  A. COLON, RANDOM; COLD BIOPSY:  - COLONIC MUCOSA WITH NO SIGNIFICANT PATHOLOGIC ALTERATION.  - NEGATIVE FOR MICROSCOPIC COLITIS, DYSPLASIA, AND MALIGNANCY.   B. COLON POLYP, DESCENDING; COLD SNARE:  - TUBULAR ADENOMA.  - NEGATIVE FOR HIGH-GRADE DYSPLASIA AND MALIGNANCY.   C. COLON POLYP, SIGMOID; COLD SNARE:  - HYPERPLASTIC POLYP.  - NEGATIVE FOR DYSPLASIA AND MALIGNANCY.   Past Medical History:  Diagnosis Date  . Allergy   . Family history of adverse reaction to anesthesia    sister- PONV  . Fibromyalgia   . Hypertension   . Pre-diabetes   . Sleep apnea    uses CPAP  . Stage III chronic kidney disease ([place])   . Wrist pain     Past Surgical History:  Procedure Laterality Date  . BIOPSY  02/20/2020   Procedure: BIOPSY;  Surgeon: Lin Landsman, MD;  Location: [facility];  Service: Endoscopy;;  . BREAST BIOPSY Left    core- neg  . BREAST BIOPSY Left    stereo- neg  . COLONOSCOPY WITH PROPOFOL N/A 02/20/2020   Procedure: COLONOSCOPY WITH PROPOFOL;  Surgeon: Lin Landsman, MD;  Location: [facility];  Service: Endoscopy;  Laterality: N/A;  . ECTOPIC PREGNANCY SURGERY    . FOOT SURGERY Left   . ORIF RADIAL FRACTURE Right 02/18/2019   Procedure: OPEN REDUCTION INTERNAL FIXATION (ORIF) RADIAL FRACTURE;  Surgeon: Earnestine Leys, MD;  Location: [facility];  Service: Orthopedics;  Laterality: Right;  . POLYPECTOMY  02/20/2020   Procedure: POLYPECTOMY;  Surgeon: Lin Landsman, MD;  Location: [facility];  Service:  Endoscopy;;    Current Outpatient Medications:  .  atorvastatin (LIPITOR) 20 MG tablet, Take 1 tablet (20 mg total) by mouth daily., Disp: 90 tablet, Rfl: 3 .  b complex vitamins tablet, Take 1 tablet by mouth daily., Disp: , Rfl:  .  buPROPion (WELLBUTRIN XL) 300 MG 24 hr tablet, TAKE 1 TABLET BY MOUTH  DAILY, Disp: 90 tablet, Rfl: 0 .  Calcium-Vitamin D 600-200 MG-UNIT tablet, Take 1 tablet by mouth 2 (two) times daily. , Disp: , Rfl:  .  Cholecalciferol 25 MCG (1000 UT) tablet, Take 1,000 Units by mouth daily., Disp: , Rfl:  .  Co-Enzyme Q-10 100 MG CAPS, Take 100 mg by mouth daily. , Disp: , Rfl:  .  gabapentin (NEURONTIN) 600 MG tablet, TAKE 1 AND 1/2 TABLETS BY  MOUTH TWICE DAILY, Disp: 270 tablet, Rfl: 1 .  lisinopril (ZESTRIL) 5 MG tablet, TAKE 1 TABLET BY MOUTH  DAILY, Disp: 90 tablet, Rfl: 3 .  loratadine (CLARITIN) 10 MG tablet, Take 10 mg by mouth daily. , Disp: , Rfl:  .  Magnesium (MAGNACAPS) 100 MG CAPS, Take 200 mg by mouth 2 (two) times daily., Disp: , Rfl:  .  niacin 500 MG tablet, Take 500 mg by mouth daily. , Disp: , Rfl:  .  sertraline (ZOLOFT) 100 MG tablet, TAKE 1 TABLET BY MOUTH  DAILY, Disp: 90 tablet, Rfl: 0   Family History  Problem Relation Age of Onset  . Hypertension Mother   . Diabetes Mother   . Hyperlipidemia Mother   . Cancer Mother   . Breast cancer Mother 29  . Hypertension Father   . Hyperlipidemia Father      Social History   Tobacco Use  . Smoking status: Former Smoker    Quit date: 11/23/1984    Years since quitting: 35.3  . Smokeless tobacco: Former Network engineer  . Vaping Use: Never used  Substance Use Topics  . Alcohol use: Yes    Alcohol/week: 10.0 standard drinks    Types: 10 Glasses of wine per week    Comment: weekly  . Drug use: No    Allergies as of 03/06/2020  . (No Known Allergies)    Review of Systems:    All systems reviewed and negative except where noted in HPI.   Physical Exam:  BP 122/78 (BP Location:  Left Arm, Patient Position: Sitting, Cuff Size: Normal)   Pulse 70   Temp 97.8 F (36.6 C) (Oral)   Ht 5\' 1"  (1.549 m)   Wt 230 lb (104.3 kg)   BMI 43.46 kg/m  No LMP recorded. Patient is postmenopausal.  General:   Alert,  Well-developed, well-nourished, pleasant and cooperative in NAD Head:  Normocephalic and atraumatic. Eyes:  Sclera clear, no icterus.   Conjunctiva pink. Ears:  Normal auditory acuity. Nose:  No deformity, discharge, or lesions. Mouth:  No deformity or lesions,oropharynx pink & moist. Neck:  Supple; no masses or thyromegaly. Lungs:  Respirations even and unlabored.  Clear throughout to auscultation.   No wheezes, crackles, or rhonchi. No acute distress. Heart:  Regular rate and rhythm; no murmurs, clicks, rubs, or gallops. Abdomen:  Normal bowel sounds. Soft, non-tender and non-distended without masses, hepatosplenomegaly or hernias noted.  No guarding or rebound tenderness.   Rectal: Small perianal skin tag, nontender digital rectal exam Msk:  Symmetrical without gross deformities. Good, equal movement & strength bilaterally. Pulses:  Normal pulses noted. Extremities:  No clubbing or edema.  No cyanosis. Neurologic:  Alert and oriented x3;  grossly normal neurologically. Skin:  Intact without significant lesions or rashes. No jaundice. Psych:  Alert and cooperative. Normal mood and affect.  Imaging Studies: No abdominal imaging  Assessment and Plan:   Rachel Townsend is a 60 y.o. pleasant Caucasian female with history of metabolic syndrome is seen in consultation for chronic nonbloody diarrhea without any other constitutional symptoms, grade 1 symptomatic external hemorrhoids  Chronic diarrhea, likely osmotic diarrhea secondary to intake of diet soda Continue to cut back on carbonated beverages and artificial sweeteners Colonoscopy is unremarkable Check celiac disease panel, and trial of pancreatic enzymes, Zenpep samples provided We will try bile acid  sequestrant if pancreatic enzymes do not work and celiac panel is negative  Symptomatic external hemorrhoids, grade 1 Discussed about hemorrhoid ligation including risks and benefits, consent obtained Patient would like to proceed with hemorrhoid ligation today   Follow up in 2 weeks after colonoscopy   [name], MD

## 2020-03-06 NOTE — Patient Instructions (Addendum)
Gave patient Zenpep samples. Please let us know if this works and we can call in a prescription for you. Take 2 capsules with the first bite of each meal and 1 tablet with the first bite of each snack

## 2020-03-09 ENCOUNTER — Telehealth: Payer: Self-pay

## 2020-03-09 NOTE — Telephone Encounter (Signed)
-----   Message from Lin Landsman, MD sent at 03/09/2020  4:29 PM EST ----- Please inform patient that the celiac panel came back negative that means she does not have celiac disease.  If pancreatic enzymes are working, will continue the same.  Please check with her  RV

## 2020-03-09 NOTE — Telephone Encounter (Signed)
Patient verbalized understanding. Patient states the Zenpep samples are working but does not want a prescription yet

## 2020-03-10 LAB — CELIAC DISEASE PANEL
Endomysial IgA: NEGATIVE
IgA/Immunoglobulin A, Serum: 196 mg/dL (ref 87–352)
Transglutaminase IgA: <2 U/mL (ref 0–3)

## 2020-03-11 ENCOUNTER — Ambulatory Visit
Admission: RE | Admit: 2020-03-11 | Discharge: 2020-03-11 | Disposition: A | Discharge status: Home / Self Care | Payer: Managed Care, Other (non HMO) | Source: Ambulatory Visit | Attending: Family Medicine | Admitting: Family Medicine

## 2020-03-11 ENCOUNTER — Other Ambulatory Visit: Payer: Self-pay

## 2020-03-11 DIAGNOSIS — Z1231 Encounter for screening mammogram for malignant neoplasm of breast: Secondary | ICD-10-CM | POA: Diagnosis not present

## 2020-03-19 ENCOUNTER — Other Ambulatory Visit: Payer: Self-pay

## 2020-03-19 ENCOUNTER — Encounter: Payer: Self-pay | Admitting: Gastroenterology

## 2020-03-19 ENCOUNTER — Ambulatory Visit (INDEPENDENT_AMBULATORY_CARE_PROVIDER_SITE_OTHER): Payer: Managed Care, Other (non HMO) | Admitting: Gastroenterology

## 2020-03-19 VITALS — BP 122/79 | HR 82 | Temp 97.9°F | Ht 61.0 in | Wt 231.1 lb

## 2020-03-19 DIAGNOSIS — K640 First degree hemorrhoids: Secondary | ICD-10-CM | POA: Diagnosis not present

## 2020-03-19 LAB — BASIC METABOLIC PANEL
BUN/Creatinine Ratio: 15 (ref 12–28)
BUN: 17 mg/dL (ref 8–27)
CO2: 22 mmol/L (ref 20–29)
Calcium: 9.9 mg/dL (ref 8.7–10.3)
Chloride: 104 mmol/L (ref 96–106)
Creatinine, Ser: 1.15 mg/dL — ABNORMAL HIGH (ref 0.57–1.00)
GFR calc Af Amer: 60 mL/min/{1.73_m2} (ref >59)
GFR calc non Af Amer: 52 mL/min/{1.73_m2} — ABNORMAL LOW (ref >59)
Glucose: 105 mg/dL — ABNORMAL HIGH (ref 65–99)
Potassium: 5.2 mmol/L (ref 3.5–5.2)
Sodium: 139 mmol/L (ref 134–144)

## 2020-03-19 LAB — HEMOGLOBIN A1C
Est. average glucose Bld gHb Est-mCnc: 114 mg/dL
Hgb A1c MFr Bld: 5.6 % (ref 4.8–5.6)

## 2020-03-19 NOTE — Progress Notes (Signed)

## 2020-03-20 ENCOUNTER — Encounter: Payer: Self-pay | Admitting: Family Medicine

## 2020-03-20 ENCOUNTER — Ambulatory Visit: Payer: Managed Care, Other (non HMO) | Admitting: Family Medicine

## 2020-03-20 VITALS — BP 128/71 | HR 70 | Ht 61.0 in | Wt 231.4 lb

## 2020-03-20 DIAGNOSIS — N183 Chronic kidney disease, stage 3 unspecified: Secondary | ICD-10-CM

## 2020-03-20 DIAGNOSIS — F3341 Major depressive disorder, recurrent, in partial remission: Secondary | ICD-10-CM

## 2020-03-20 DIAGNOSIS — N1831 Chronic kidney disease, stage 3a: Secondary | ICD-10-CM | POA: Diagnosis not present

## 2020-03-20 DIAGNOSIS — I129 Hypertensive chronic kidney disease with stage 1 through stage 4 chronic kidney disease, or unspecified chronic kidney disease: Secondary | ICD-10-CM

## 2020-03-20 NOTE — Patient Instructions (Addendum)
Thank you for coming to the office today.  Keep up the great work overall.  Kidney function is improved as we discussed. Cr 1.15, still in the range of CKD-III but it is much better and I am very pleased.  [facility] results from your work biometric.  Please schedule a Follow-up Appointment to: Return in about 8 months (around 11/17/2020) for 8 month Annual Physical (can do [facility] orders if need).  If you have any other questions or concerns, please feel free to call the office or send a message through [place]. You may also schedule an earlier appointment if necessary.  Additionally, you may be receiving a survey about your experience at our office within a few days to 1 week by e-mail or mail. We value your feedback.  [name], DO [place]

## 2020-03-20 NOTE — Progress Notes (Signed)
Subjective:    Patient ID: Rachel Townsend, female    DOB: 02/23/1960, 60 y.o.   MRN: [id number]  Rachel Townsend is a 60 y.o. female presenting on 03/20/2020 for Chronic Kidney Disease   HPI   Lifestyle / Morbid Obesity BMI >43 - Less physically active - Weight increased - Goal to improve diet  CHRONIC HTN / CKD-III Reportsno concerns, not checking BP at home. Repeat chemistry shows improved Creatinine to 1.15, previous up to 1.4, likely due to improved hydration, limited diet soda and not taking Etolodac anymore. Still has some flare up at time with Arthritis. She is taking Arthritis Str Tylenol and has Physiological scientist at gym. Current Meds -Lisinopril 5mg  Reports good compliance, took meds today. Tolerating well, w/o complaints.  FOLLOW-UPInsomnia / OSAon CPAP Continuedtreat withCPAP for OSA Improved sleep with CPAP. She adheres to machine and uses it nightly avg>8hours nightly - Improved weight loss and more energy -No new concerns or symptoms - Insomnia is improved now  FOLLOW-UP DEPRESSION / FIBROMYALGIA: See prior notes for background information. - Today patient reportsno new concerns. Moodremains improvedmuch better - Still has generalized pain from fibromyalgia, but seems improved with better sleep - Continues Sertraline and Wellbutrin - Takes Gabapentin 900mg  (1.5 tabs 600mg ) BID No longer taking NSAID Etolodac   Health Maintenance: COVID Vaccine updated, booster pfizer  Last mammogram 11/2018 abnormal, had Ultrasound done 12/2018 Mammogramed updated 03/11/20 - birads 1 negative, repeat in 1 year. 03/2021  Last colonoscopy done by Dr Marius Ditch 02/20/20 - had polyps with tubular adenoma and hyperplastic, repeat colonoscopy in 7 years. 02/2027.    Depression screen [facility] 2/9 03/20/2020 11/18/2019 10/30/2018  Decreased Interest 1 1 0  Down, Depressed, Hopeless 1 1 1   PHQ - 2 Score 2 2 1   Altered sleeping 1 2 2   Tired, decreased energy 1 1 1   Change in  appetite 1 0 2  Feeling bad or failure about yourself  0 1 1  Trouble concentrating 0 0 0  Moving slowly or fidgety/restless 0 0 0  Suicidal thoughts 0 0 0  PHQ-9 Score 5 6 7   Difficult doing work/chores Not difficult at all Not difficult at all Not difficult at all  Some recent data might be hidden    Social History   Tobacco Use  . Smoking status: Former Smoker    Quit date: 11/23/1984    Years since quitting: 35.3  . Smokeless tobacco: Former Network engineer  . Vaping Use: Never used  Substance Use Topics  . Alcohol use: Yes    Alcohol/week: 10.0 standard drinks    Types: 10 Glasses of wine per week    Comment: weekly  . Drug use: No    Review of Systems Per HPI unless specifically indicated above     Objective:    BP 128/71   Pulse 70   Ht 5\' 1"  (1.549 m)   Wt 231 lb 6.4 oz (105 kg)   SpO2 99%   BMI 43.72 kg/m   Wt Readings from Last 3 Encounters:  03/20/20 231 lb 6.4 oz (105 kg)  03/19/20 231 lb 2 oz (104.8 kg)  03/06/20 230 lb (104.3 kg)    Physical Exam Vitals and nursing note reviewed.  Constitutional:      General: She is not in acute distress.    Appearance: She is well-developed and well-nourished. She is not diaphoretic.     Comments: Well-appearing, comfortable, cooperative  HENT:     Head: Normocephalic and atraumatic.  Mouth/Throat:     Mouth: Oropharynx is clear and moist.  Eyes:     General:        Right eye: No discharge.        Left eye: No discharge.     Conjunctiva/sclera: Conjunctivae normal.  Neck:     Thyroid: No thyromegaly.  Cardiovascular:     Rate and Rhythm: Normal rate and regular rhythm.     Pulses: Intact distal pulses.     Heart sounds: Normal heart sounds. No murmur heard.   Pulmonary:     Effort: Pulmonary effort is normal. No respiratory distress.     Breath sounds: Normal breath sounds. No wheezing or rales.  Musculoskeletal:        General: No edema. Normal range of motion.     Cervical back: Normal  range of motion and neck supple.  Lymphadenopathy:     Cervical: No cervical adenopathy.  Skin:    General: Skin is warm and dry.     Findings: No erythema or rash.  Neurological:     Mental Status: She is alert and oriented to person, place, and time.  Psychiatric:        Mood and Affect: Mood and affect normal.        Behavior: Behavior normal.     Comments: Well groomed, good eye contact, normal speech and thoughts    Results for orders placed or performed in visit on 03/06/20  Celiac Disease Panel  Result Value Ref Range   Endomysial IgA Negative Negative   Transglutaminase IgA <2 0 - 3 U/mL   IgA/Immunoglobulin A, Serum 196 87 - 352 mg/dL      Assessment & Plan:   Problem List Items Addressed This Visit    Major depressive disorder, recurrent, in partial remission ([place])    Improved, stable Complicated by fibromyalgia and insomnia, OSA - remains on CPAP - Failed various meds including TCA (Nortriptyline), SSRI (Lexapro, various doses of Zoloft), SNRI (Cymbalta, Venlafaxine) - Not established with Psychiatry / Therapist  Plan: 1. Continue current Sertraline 100mg  daily, continue Wellbutrin XL 300mg  daily 2. Consider other SSRI/SNRI options if need      Chronic kidney disease (CKD), stage III (moderate) ([facility])    Improved Creatinine to 1.1 Suspected factors with soda intake and also still regular dosing oral NSAID etolodac had reduced in past but still taking daily dose it seems  Plan Control HTN with med Monitor A1c, in normal vs PreDM range Remains OFF Etolodac and Soda      Benign hypertension with CKD (chronic kidney disease) stage III - Primary    Well-controlled HTN - Home BP readings not checking  CKD-III elevated creatinine on last check, trending up - likely with sodas and NSAIDs    Plan:  1. Continue current BP regimen - Lisinopril 5mg  daily 2. Encourage improved lifestyle - low sodium diet, regular exercise 3. May consider monitor BP outside office,  bring readings to next visit, if persistently >140/90 or new symptoms notify office sooner         No orders of the defined types were placed in this encounter.    Follow up plan: Return in about 8 months (around 11/17/2020) for 8 month Annual Physical (can do [facility] orders if need).   [name], DO [facility] 03/20/2020, 9:18 AM

## 2020-03-22 NOTE — Assessment & Plan Note (Signed)
Improved, stable Complicated by fibromyalgia and insomnia, OSA - remains on CPAP - Failed various meds including TCA (Nortriptyline), SSRI (Lexapro, various doses of Zoloft), SNRI (Cymbalta, Venlafaxine) - Not established with Psychiatry / Therapist  Plan: 1. Continue current Sertraline 100mg  daily, continue Wellbutrin XL 300mg  daily 2. Consider other SSRI/SNRI options if need

## 2020-03-22 NOTE — Assessment & Plan Note (Signed)
Well-controlled HTN - Home BP readings not checking  CKD-III elevated creatinine on last check, trending up - likely with sodas and NSAIDs    Plan:  1. Continue current BP regimen - Lisinopril 5mg  daily 2. Encourage improved lifestyle - low sodium diet, regular exercise 3. May consider monitor BP outside office, bring readings to next visit, if persistently >140/90 or new symptoms notify office sooner

## 2020-03-22 NOTE — Assessment & Plan Note (Signed)
Improved Creatinine to 1.1 Suspected factors with soda intake and also still regular dosing oral NSAID etolodac had reduced in past but still taking daily dose it seems  Plan Control HTN with med Monitor A1c, in normal vs PreDM range Remains OFF Etolodac and [employer]

## 2020-04-25 ENCOUNTER — Other Ambulatory Visit: Payer: Self-pay | Admitting: Family Medicine

## 2020-04-25 DIAGNOSIS — F331 Major depressive disorder, recurrent, moderate: Secondary | ICD-10-CM

## 2020-05-25 ENCOUNTER — Other Ambulatory Visit: Payer: Self-pay | Admitting: Family Medicine

## 2020-05-25 DIAGNOSIS — F331 Major depressive disorder, recurrent, moderate: Secondary | ICD-10-CM

## 2020-05-25 DIAGNOSIS — F5105 Insomnia due to other mental disorder: Secondary | ICD-10-CM

## 2020-05-25 DIAGNOSIS — F99 Mental disorder, not otherwise specified: Secondary | ICD-10-CM

## 2020-08-01 ENCOUNTER — Other Ambulatory Visit: Payer: Self-pay | Admitting: Family Medicine

## 2020-08-01 DIAGNOSIS — F331 Major depressive disorder, recurrent, moderate: Secondary | ICD-10-CM

## 2020-08-01 DIAGNOSIS — F99 Mental disorder, not otherwise specified: Secondary | ICD-10-CM

## 2020-08-01 DIAGNOSIS — F5105 Insomnia due to other mental disorder: Secondary | ICD-10-CM

## 2020-11-10 ENCOUNTER — Other Ambulatory Visit: Payer: Self-pay | Admitting: Family Medicine

## 2020-11-10 DIAGNOSIS — F5105 Insomnia due to other mental disorder: Secondary | ICD-10-CM

## 2020-11-10 DIAGNOSIS — F331 Major depressive disorder, recurrent, moderate: Secondary | ICD-10-CM

## 2020-11-10 DIAGNOSIS — F99 Mental disorder, not otherwise specified: Secondary | ICD-10-CM

## 2020-11-10 NOTE — Telephone Encounter (Signed)
Patient will need an office visit for further refills. Requested Prescriptions  Pending Prescriptions Disp Refills  . sertraline (ZOLOFT) 100 MG tablet [Pharmacy Med Name: Sertraline HCl 100 MG Oral Tablet] 30 tablet 0    Sig: TAKE 1 TABLET BY MOUTH  DAILY     Psychiatry:  Antidepressants - SSRI Failed - 11/10/2020  5:27 AM      Failed - Valid encounter within last 6 months    Recent Outpatient Visits          7 months ago Benign hypertension with CKD (chronic kidney disease) stage III   [facility] Olin Hauser, DO   11 months ago Annual physical exam   [place], DO   2 years ago Encounter for annual physical exam   [facility] [employer]key [employer], NP   2 years ago Annual physical exam   [place], DO   3 years ago Moderate episode of recurrent major depressive disorder [facility])   [place], DO             Passed - Completed PHQ-2 or PHQ-9 in the last 360 days

## 2020-11-20 ENCOUNTER — Other Ambulatory Visit: Payer: Self-pay

## 2020-11-20 ENCOUNTER — Encounter: Payer: Self-pay | Admitting: Family Medicine

## 2020-11-20 ENCOUNTER — Ambulatory Visit (INDEPENDENT_AMBULATORY_CARE_PROVIDER_SITE_OTHER): Payer: Managed Care, Other (non HMO) | Admitting: Family Medicine

## 2020-11-20 VITALS — BP 139/60 | HR 82 | Ht 61.0 in | Wt 235.4 lb

## 2020-11-20 DIAGNOSIS — N1831 Chronic kidney disease, stage 3a: Secondary | ICD-10-CM

## 2020-11-20 DIAGNOSIS — F5105 Insomnia due to other mental disorder: Secondary | ICD-10-CM

## 2020-11-20 DIAGNOSIS — Z6841 Body Mass Index (BMI) 40.0 and over, adult: Secondary | ICD-10-CM

## 2020-11-20 DIAGNOSIS — Z23 Encounter for immunization: Secondary | ICD-10-CM

## 2020-11-20 DIAGNOSIS — G4733 Obstructive sleep apnea (adult) (pediatric): Secondary | ICD-10-CM

## 2020-11-20 DIAGNOSIS — Z9989 Dependence on other enabling machines and devices: Secondary | ICD-10-CM

## 2020-11-20 DIAGNOSIS — F99 Mental disorder, not otherwise specified: Secondary | ICD-10-CM

## 2020-11-20 DIAGNOSIS — F3341 Major depressive disorder, recurrent, in partial remission: Secondary | ICD-10-CM | POA: Diagnosis not present

## 2020-11-20 DIAGNOSIS — I129 Hypertensive chronic kidney disease with stage 1 through stage 4 chronic kidney disease, or unspecified chronic kidney disease: Secondary | ICD-10-CM

## 2020-11-20 DIAGNOSIS — Z0000 Encounter for general adult medical examination without abnormal findings: Secondary | ICD-10-CM | POA: Diagnosis not present

## 2020-11-20 DIAGNOSIS — N183 Chronic kidney disease, stage 3 unspecified: Secondary | ICD-10-CM

## 2020-11-20 MED ORDER — SERTRALINE HCL 100 MG PO TABS: 100.0000 mg | ORAL_TABLET | Freq: Every day | ORAL | 3 refills | Status: DC

## 2020-11-20 NOTE — Assessment & Plan Note (Signed)
Well controlled, chronic OSA on CPAP - Continue current CPAP therapy, patient seems to be benefiting from therapy (still goal to improve weight)

## 2020-11-20 NOTE — Assessment & Plan Note (Signed)
Improved, stable Complicated by fibromyalgia and insomnia, OSA - remains on CPAP - Failed various meds including TCA (Nortriptyline), SSRI (Lexapro, various doses of Zoloft), SNRI (Cymbalta, Venlafaxine) - Not established with Psychiatry / Therapist  Plan: 1. Continue current Sertraline 100mg  daily, continue Wellbutrin XL 300mg  daily 2. Consider other SSRI/SNRI options if need

## 2020-11-20 NOTE — Assessment & Plan Note (Signed)
Weight up Encourage lifestyle diet exercise

## 2020-11-20 NOTE — Assessment & Plan Note (Signed)
Secondary to OSA, mood, pain On CPAP per Pulm/Sleep specialist

## 2020-11-20 NOTE — Patient Instructions (Addendum)
Thank you for coming to the office today.  Shingrix vaccine today, 2nd dose 2-6 months apart. Nurse visit.  Flu Shot today.  Future COVID19 Updated Booster Omicron variant coverage  [facility] orders printed.   Please schedule a Follow-up Appointment to: Return in about 1 year (around 11/20/2021) for 1 year Annual Physical with [facility] orders.  If you have any other questions or concerns, please feel free to call the office or send a message through [place]. You may also schedule an earlier appointment if necessary.  Additionally, you may be receiving a survey about your experience at our office within a few days to 1 week by e-mail or mail. We value your feedback.  [name], DO [place]

## 2020-11-20 NOTE — Assessment & Plan Note (Signed)
Well-controlled HTN - Home BP readings not checking  CKD-III elevated creatinine on last check, trending up - likely with sodas and NSAIDs    Plan:  1. Continue current BP regimen - Lisinopril 5mg  daily 2. Encourage improved lifestyle - low sodium diet, regular exercise 3. May consider monitor BP outside office, bring readings to next visit, if persistently >140/90 or new symptoms notify office sooner

## 2020-11-20 NOTE — Progress Notes (Signed)
Subjective:    Patient ID: Rachel Townsend, female    DOB: 02-09-1960, 60 y.o.   MRN: [id number]  Rachel Townsend is a 60 y.o. female presenting on 11/20/2020 for Annual Exam   HPI  Here for Annual Physical and Lab Review.  Lifestyle / Morbid Obesity BMI >44 - Less physically active - Weight increased - Goal to improve diet   CHRONIC HTN / CKD-III Reports no concerns, not checking BP at home. Current Meds - Lisinopril 5mg    Reports good compliance, took meds today. Tolerating well, w/o complaints.    FOLLOW-UP Insomnia / OSA on CPAP Continued treat with CPAP for OSA Improved sleep with CPAP. She adheres to machine and uses it nightly avg >8 hours nightly - Improved weight loss and more energy - No new concerns or symptoms - Insomnia is improved now   FOLLOW-UP DEPRESSION / FIBROMYALGIA: See prior notes for background information. - Today patient reports no new concerns. Mood remains improved much better - Still has generalized pain from fibromyalgia, but seems improved with better sleep - Continues Sertraline and Wellbutrin - Takes Gabapentin 900mg  (1.5 tabs 600mg ) BID No longer taking NSAID Etolodac   Upcoming next week, Orthopedics removal of plate. R wrist.    Health Maintenance: COVID Vaccine updated will need the new booster when ready  SHingrix dose #1 today  Flu Shot today  Last mammogram 11/2018 abnormal, had Ultrasound done 12/2018 Mammogramed updated 03/11/20 - birads 1 negative, repeat in 1 year. 03/2021   Last colonoscopy done by Dr Marius Ditch 02/20/20 - had polyps with tubular adenoma and hyperplastic, repeat colonoscopy in 7 years. 02/2027.   Depression screen [facility] 2/9 11/20/2020 03/20/2020 11/18/2019  Decreased Interest 0 1 1  Down, Depressed, Hopeless 0 1 1  PHQ - 2 Score 0 2 2  Altered sleeping 1 1 2   Tired, decreased energy 1 1 1   Change in appetite 0 1 0  Feeling bad or failure about yourself  0 0 1  Trouble concentrating 0 0 0  Moving slowly or  fidgety/restless 0 0 0  Suicidal thoughts 0 0 0  PHQ-9 Score 2 5 6   Difficult doing work/chores Not difficult at all Not difficult at all Not difficult at all  Some recent data might be hidden   GAD 7 : Generalized Anxiety Score 11/20/2020 11/18/2019 05/22/2017  Nervous, Anxious, on Edge 0 1 1  Control/stop worrying 0 1 1  Worry too much - different things 0 1 1  Trouble relaxing 0 1 1  Restless 0 0 0  Easily annoyed or irritable 0 0 0  Afraid - awful might happen 0 0 0  Total GAD 7 Score 0 4 4  Anxiety Difficulty Not difficult at all Not difficult at all Not difficult at all      Past Medical History:  Diagnosis Date   Allergy    Family history of adverse reaction to anesthesia    sister- PONV   Fibromyalgia    Hypertension    Pre-diabetes    Sleep apnea    uses CPAP   Stage III chronic kidney disease ([place])    Wrist pain    Past Surgical History:  Procedure Laterality Date   BIOPSY  02/20/2020   Procedure: BIOPSY;  Surgeon: Lin Landsman, MD;  Location: [place];  Service: Endoscopy;;   BREAST BIOPSY Left    core- neg   BREAST BIOPSY Left    stereo- neg   COLONOSCOPY WITH PROPOFOL N/A 02/20/2020  Procedure: COLONOSCOPY WITH PROPOFOL;  Surgeon: Lin Landsman, MD;  Location: [place];  Service: Endoscopy;  Laterality: N/A;   ECTOPIC PREGNANCY SURGERY     FOOT SURGERY Left    ORIF RADIAL FRACTURE Right 02/18/2019   Procedure: OPEN REDUCTION INTERNAL FIXATION (ORIF) RADIAL FRACTURE;  Surgeon: Earnestine Leys, MD;  Location: [facility];  Service: Orthopedics;  Laterality: Right;   POLYPECTOMY  02/20/2020   Procedure: POLYPECTOMY;  Surgeon: Lin Landsman, MD;  Location: [place];  Service: Endoscopy;;   Social History   Socioeconomic History   Marital status: Married    Spouse name: Not on file   Number of children: Not on file   Years of education: Not on file   Highest education level: Not on file  Occupational History    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[employer])  Tobacco Use   Smoking status: Former    Types: Cigarettes    Quit date: 11/23/1984    Years since quitting: 36.0   Smokeless tobacco: Former  Scientific laboratory technician Use: Never used  Substance and Sexual Activity   Alcohol use: Yes    Alcohol/week: 10.0 standard drinks    Types: 10 Glasses of wine per week    Comment: weekly   Drug use: No   Sexual activity: Yes    Birth control/protection: Injection  Other Topics Concern   Not on file  Social History Narrative   Not on file   Social Determinants of Health   Financial Resource Strain: Not on file  Food Insecurity: Not on file  Transportation Needs: Not on file  Physical Activity: Not on file  Stress: Not on file  Social Connections: Not on file  Intimate Partner Violence: Not on file   Family History  Problem Relation Age of Onset   Hypertension Mother    Diabetes Mother    Hyperlipidemia Mother    Cancer Mother    Breast cancer Mother 85   Hypertension Father    Hyperlipidemia Father    Breast cancer Sister    Current Outpatient Medications on File Prior to Visit  Medication Sig   acetaminophen (TYLENOL) 650 MG CR tablet Take 650 mg by mouth every 8 (eight) hours as needed.   atorvastatin (LIPITOR) 20 MG tablet Take 1 tablet (20 mg total) by mouth daily.   buPROPion (WELLBUTRIN XL) 300 MG 24 hr tablet TAKE 1 TABLET BY MOUTH  DAILY   gabapentin (NEURONTIN) 600 MG tablet TAKE 1 AND 1/2 TABLETS BY  MOUTH TWICE DAILY   lisinopril (ZESTRIL) 5 MG tablet TAKE 1 TABLET BY MOUTH  DAILY   loratadine (CLARITIN) 10 MG tablet Take 10 mg by mouth daily.    [DISCONTINUED] Icosapent Ethyl (VASCEPA) 1 g CAPS Take 2 capsules (2 g total) by mouth 2 (two) times daily.   No current facility-administered medications on file prior to visit.    Review of Systems  Constitutional:  Negative for activity change, appetite change, chills, diaphoresis, fatigue and fever.  HENT:  Negative for  congestion and hearing loss.   Eyes:  Negative for visual disturbance.  Respiratory:  Negative for cough, chest tightness, shortness of breath and wheezing.   Cardiovascular:  Negative for chest pain, palpitations and leg swelling.  Gastrointestinal:  Negative for abdominal pain, constipation, diarrhea, nausea and vomiting.  Genitourinary:  Negative for dysuria, frequency and hematuria.  Musculoskeletal:  Negative for arthralgias and neck pain.  Skin:  Negative for rash.  Neurological:  Negative for dizziness, weakness,  light-headedness, numbness and headaches.  Hematological:  Negative for adenopathy.  Psychiatric/Behavioral:  Negative for behavioral problems, dysphoric mood and sleep disturbance.   Per HPI unless specifically indicated above      Objective:    BP 139/60   Pulse 82   Ht 5\' 1"  (1.549 m)   Wt 235 lb 6.4 oz (106.8 kg)   SpO2 99%   BMI 44.48 kg/m   Wt Readings from Last 3 Encounters:  11/20/20 235 lb 6.4 oz (106.8 kg)  03/20/20 231 lb 6.4 oz (105 kg)  03/19/20 231 lb 2 oz (104.8 kg)    Physical Exam Vitals and nursing note reviewed.  Constitutional:      General: She is not in acute distress.    Appearance: She is well-developed. She is not diaphoretic.     Comments: Well-appearing, comfortable, cooperative  HENT:     Head: Normocephalic and atraumatic.  Eyes:     General:        Right eye: No discharge.        Left eye: No discharge.     Conjunctiva/sclera: Conjunctivae normal.     Pupils: Pupils are equal, round, and reactive to light.  Neck:     Thyroid: No thyromegaly.  Cardiovascular:     Rate and Rhythm: Normal rate and regular rhythm.     Pulses: Normal pulses.     Heart sounds: Normal heart sounds. No murmur heard. Pulmonary:     Effort: Pulmonary effort is normal. No respiratory distress.     Breath sounds: Normal breath sounds. No wheezing or rales.  Abdominal:     General: Bowel sounds are normal. There is no distension.     Palpations:  Abdomen is soft. There is no mass.     Tenderness: There is no abdominal tenderness.  Musculoskeletal:        General: No tenderness. Normal range of motion.     Cervical back: Normal range of motion and neck supple.     Right lower leg: No edema.     Left lower leg: No edema.     Comments: Upper / Lower Extremities: - Normal muscle tone, strength bilateral upper extremities 5/5, lower extremities 5/5  Lymphadenopathy:     Cervical: No cervical adenopathy.  Skin:    General: Skin is warm and dry.     Findings: No erythema or rash.  Neurological:     Mental Status: She is alert and oriented to person, place, and time.     Comments: Distal sensation intact to light touch all extremities  Psychiatric:        Mood and Affect: Mood normal.        Behavior: Behavior normal.        Thought Content: Thought content normal.     Comments: Well groomed, good eye contact, normal speech and thoughts     Results for orders placed or performed in visit on 03/06/20  Celiac Disease Panel  Result Value Ref Range   Endomysial IgA Negative Negative   Transglutaminase IgA <2 0 - 3 U/mL   IgA/Immunoglobulin A, Serum 196 87 - 352 mg/dL      Assessment & Plan:   Problem List Items Addressed This Visit     OSA on CPAP    Well controlled, chronic OSA on CPAP - Continue current CPAP therapy, patient seems to be benefiting from therapy (still goal to improve weight)      Morbid obesity with BMI of 40.0-44.9, adult ([place])  Weight up Encourage lifestyle diet exercise      Relevant Orders   Lipid panel   Hemoglobin A1c   Comprehensive metabolic panel   TSH   Major depressive disorder, recurrent, in partial remission (HCC)    Improved, stable Complicated by fibromyalgia and insomnia, OSA - remains on CPAP - Failed various meds including TCA (Nortriptyline), SSRI (Lexapro, various doses of Zoloft), SNRI (Cymbalta, Venlafaxine) - Not established with Psychiatry / Therapist  Plan: 1.  Continue current Sertraline 100mg  daily, continue Wellbutrin XL 300mg  daily 2. Consider other SSRI/SNRI options if need      Relevant Medications   sertraline (ZOLOFT) 100 MG tablet   Insomnia    Secondary to OSA, mood, pain On CPAP per Pulm/Sleep specialist      Relevant Medications   sertraline (ZOLOFT) 100 MG tablet   Chronic kidney disease (CKD), stage III (moderate) (HCC)   Benign hypertension with CKD (chronic kidney disease) stage III    Well-controlled HTN - Home BP readings not checking  CKD-III elevated creatinine on last check, trending up - likely with sodas and NSAIDs    Plan:  1. Continue current BP regimen - Lisinopril 5mg  daily 2. Encourage improved lifestyle - low sodium diet, regular exercise 3. May consider monitor BP outside office, bring readings to next visit, if persistently >140/90 or new symptoms notify office sooner      Other Visit Diagnoses     Annual physical exam    -  Primary   Relevant Orders   Lipid panel   CBC with Differential/Platelet   Hemoglobin A1c   Comprehensive metabolic panel   TSH   Needs flu shot       Relevant Orders   Flu Vaccine QUAD 31mo+IM (Fluarix, Fluzone & Alfiuria Quad PF) (Completed)   Need for shingles vaccine       Relevant Orders   Varicella-zoster vaccine IM (Completed)       Updated Health Maintenance information Shingrix #1 and Flu Shot Future COVID booster Reviewed recent lab results with patient Encouraged improvement to lifestyle with diet and exercise Goal of weight loss    Meds ordered this encounter  Medications   sertraline (ZOLOFT) 100 MG tablet    Sig: Take 1 tablet (100 mg total) by mouth daily.    Dispense:  90 tablet    Refill:  3    Add refills change to 90 day      Follow up plan: Return in about 1 year (around 11/20/2021) for 1 year Annual Physical with [facility] orders.  [name], [facility] 11/20/2020, 2:23 PM

## 2020-11-25 ENCOUNTER — Other Ambulatory Visit: Payer: Self-pay

## 2020-11-25 ENCOUNTER — Other Ambulatory Visit: Payer: Self-pay | Admitting: Specialist

## 2020-11-25 ENCOUNTER — Encounter
Admission: RE | Admit: 2020-11-25 | Discharge: 2020-11-25 | Disposition: A | Discharge status: Home / Self Care | Payer: Managed Care, Other (non HMO) | Source: Ambulatory Visit | Attending: Orthopedic Surgery | Admitting: Orthopedic Surgery

## 2020-11-25 DIAGNOSIS — N1831 Chronic kidney disease, stage 3a: Secondary | ICD-10-CM

## 2020-11-25 HISTORY — DX: Gastro-esophageal reflux disease without esophagitis: K21.9

## 2020-11-25 NOTE — Patient Instructions (Signed)
Your procedure is scheduled on:12-01-20 Tuesday Report to the Registration Desk on the 1st floor of the [place].Then proceed to the 2nd floor Surgery Desk in the [place] To find out your arrival time, please call [phone] between 1PM - 3PM on:11-30-20 Monday  REMEMBER: Instructions that are not followed completely may result in serious medical risk, up to and including death; or upon the discretion of your surgeon and anesthesiologist your surgery may need to be rescheduled.  Do not eat food after midnight the night before surgery.  No gum chewing, lozengers or hard candies.  You may however, drink CLEAR liquids up to 2 hours before you are scheduled to arrive for your surgery. Do not drink anything within 2 hours of your scheduled arrival time.  Clear liquids include: - water  - apple juice without pulp - gatorade (not RED, PURPLE, OR BLUE) - black coffee or tea (Do NOT add milk or creamers to the coffee or tea) Do NOT drink anything that is not on this list.  TAKE THESE MEDICATIONS THE MORNING OF SURGERY WITH A SIP OF WATER: -atorvastatin (LIPITOR) 20 MG tablet -buPROPion (WELLBUTRIN XL) 300 MG 24 hr tablet -gabapentin (NEURONTIN) 600 MG tablet -loratadine (CLARITIN) 10 MG tablet -sertraline (ZOLOFT) 100 MG tablet  One week prior to surgery: Stop Anti-inflammatories (NSAIDS) such as Advil, Aleve, Ibuprofen, Motrin, Naproxen, Naprosyn and Aspirin based products such as Excedrin, Goodys Powder, BC Powder.You may however, continue to take Tylenol if needed for pain up until the day of surgery.  Stop ANY OVER THE COUNTER supplements/vitamins NOW (11-25-20) until after surgery (Calcium Carb-Cholecalciferol (CALCIUM 600 + D PO), Cholecalciferol (DIALYVITE VITAMIN D 5000) 125 MCG (5000 UT) capsule, Coenzyme Q10 (COQ10) 100 MG CAPS, Magnesium Bisglycinate (MAG GLYCINATE PO), SUPER B COMPLEX/C PO)  No Alcohol for 24 hours before or after surgery.  No Smoking including  e-cigarettes for 24 hours prior to surgery.  No chewable tobacco products for at least 6 hours prior to surgery.  No nicotine patches on the day of surgery.  Do not use any "recreational" drugs for at least a week prior to your surgery.  Please be advised that the combination of cocaine and anesthesia may have negative outcomes, up to and including death. If you test positive for cocaine, your surgery will be cancelled.  On the morning of surgery brush your teeth with toothpaste and water, you may rinse your mouth with mouthwash if you wish. Do not swallow any toothpaste or mouthwash.  Use CHG Soap as directed on instruction sheet.  Do not wear jewelry, make-up, hairpins, clips or nail polish.  Do not wear lotions, powders, or perfumes.   Do not shave body from the neck down 48 hours prior to surgery just in case you cut yourself which could leave a site for infection.  Also, freshly shaved skin may become irritated if using the CHG soap.  Contact lenses, hearing aids and dentures may not be worn into surgery.  Do not bring valuables to the hospital. [facility] is not responsible for any missing/lost belongings or valuables.   Bring your C-PAP to the hospital with you   Notify your doctor if there is any change in your medical condition (cold, fever, infection).  Wear comfortable clothing (specific to your surgery type) to the hospital.  After surgery, you can help prevent lung complications by doing breathing exercises.  Take deep breaths and cough every 1-2 hours. Your doctor may order a device called an Chiropodist to  help you take deep breaths. When coughing or sneezing, hold a pillow firmly against your incision with both hands. This is called "splinting." Doing this helps protect your incision. It also decreases belly discomfort.  If you are being admitted to the hospital overnight, leave your suitcase in the car. After surgery it may be brought to your room.  If  you are being discharged the day of surgery, you will not be allowed to drive home. You will need a responsible adult (18 years or older) to drive you home and stay with you that night.   If you are taking public transportation, you will need to have a responsible adult (18 years or older) with you. Please confirm with your physician that it is acceptable to use public transportation.   Please call the [place] Dept. at [phone] if you have any questions about these instructions.  Surgery Visitation Policy:  Patients undergoing a surgery or procedure may have one family member or support person with them as long as that person is not COVID-19 positive or experiencing its symptoms.  That person may remain in the waiting area during the procedure and may rotate out with other people.  Inpatient Visitation:    Visiting hours are 7 a.m. to 8 p.m. Up to two visitors ages 16+ are allowed at one time in a patient room. The visitors may rotate out with other people during the day. Visitors must check out when they leave, or other visitors will not be allowed. One designated support person may remain overnight. The visitor must pass COVID-19 screenings, use hand sanitizer when entering and exiting the patient's room and wear a mask at all times, including in the patient's room. Patients must also wear a mask when staff or their visitor are in the room. Masking is required regardless of vaccination status.

## 2020-11-25 NOTE — H&P (Signed)
PREOPERATIVE H&P  Chief Complaint: T84.84XA Pain due to internal orthopedic prosth dev/grft, init  HPI: Rachel Townsend is a 60 y.o. female who presents for preoperative history and physical with a diagnosis of T84.84XA Pain due to internal orthopedic prosth dev/grft, init.  She is a year and a half post ORIF of the right distal radius for Colles' fracture.  She healed well.  She is having some pain with extended use of the wrist and arm.  X-rays show good healing of the fracture with no change in position of the hardware.  Some of the distal radius did settle and a portion of plate looks a little long.  She wishes to have the hardware removed.  Symptoms are rated as moderate to severe, and have been worsening.  This is significantly impairing activities of daily living.  She has elected for surgical management.   Past Medical History:  Diagnosis Date   Allergy    Family history of adverse reaction to anesthesia    sister- PONV   Fibromyalgia    GERD (gastroesophageal reflux disease)    Hypertension    Pre-diabetes    Sleep apnea    uses CPAP   Stage III chronic kidney disease ([place])    Wrist pain    Past Surgical History:  Procedure Laterality Date   BIOPSY  02/20/2020   Procedure: BIOPSY;  Surgeon: Lin Landsman, MD;  Location: [place];  Service: Endoscopy;;   BREAST BIOPSY Left    core- neg   BREAST BIOPSY Left    stereo- neg   COLONOSCOPY WITH PROPOFOL N/A 02/20/2020   Procedure: COLONOSCOPY WITH PROPOFOL;  Surgeon: Lin Landsman, MD;  Location: [place];  Service: Endoscopy;  Laterality: N/A;   ECTOPIC PREGNANCY SURGERY     FOOT SURGERY Left    ORIF RADIAL FRACTURE Right 02/18/2019   Procedure: OPEN REDUCTION INTERNAL FIXATION (ORIF) RADIAL FRACTURE;  Surgeon: Earnestine Leys, MD;  Location: [facility];  Service: Orthopedics;  Laterality: Right;   POLYPECTOMY  02/20/2020   Procedure: POLYPECTOMY;  Surgeon: Lin Landsman, MD;  Location:  [place];  Service: Endoscopy;;   WISDOM TOOTH EXTRACTION     Social History   Socioeconomic History   Marital status: Married    Spouse name: Not on file   Number of children: Not on file   Years of education: Not on file   Highest education level: Not on file  Occupational History   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[employer])  Tobacco Use   Smoking status: Former    Packs/day: 1.00    Years: 10.00    Pack years: 10.00    Types: Cigarettes    Quit date: 11/23/1984    Years since quitting: 36.0   Smokeless tobacco: Former  Scientific laboratory technician Use: Never used  Substance and Sexual Activity   Alcohol use: Yes    Alcohol/week: 10.0 standard drinks    Types: 10 Glasses of wine per week    Comment: weekly   Drug use: No   Sexual activity: Yes    Birth control/protection: Injection  Other Topics Concern   Not on file  Social History Narrative   Not on file   Social Determinants of Health   Financial Resource Strain: Not on file  Food Insecurity: Not on file  Transportation Needs: Not on file  Physical Activity: Not on file  Stress: Not on file  Social Connections: Not on file   Family History  Problem Relation  Age of Onset   Hypertension Mother    Diabetes Mother    Hyperlipidemia Mother    Cancer Mother    Breast cancer Mother 73   Hypertension Father    Hyperlipidemia Father    Breast cancer Sister    No Known Allergies Prior to Admission medications   Medication Sig Start Date End Date Taking? Authorizing Provider  acetaminophen (TYLENOL) 650 MG CR tablet Take 1,300 mg by mouth 2 (two) times daily as needed for pain.   Yes [provider]  Alum Hydroxide-Mag Carbonate (GAVISCON EXTRA STRENGTH) 160-105 MG CHEW Chew 2 tablets by mouth daily as needed (heartburn).   Yes [provider]  Artificial Saliva (DRY MOUTH DROPS) LOZG Use as directed 1 lozenge in the mouth or throat daily as needed (dry mouth).   Yes [provider]   atorvastatin (LIPITOR) 20 MG tablet Take 1 tablet (20 mg total) by mouth daily. Patient taking differently: Take 20 mg by mouth every morning. 01/09/20  Yes Karamalegos, Devonne Doughty, DO  buPROPion (WELLBUTRIN XL) 300 MG 24 hr tablet TAKE 1 TABLET BY MOUTH  DAILY Patient taking differently: Take 300 mg by mouth every morning. 04/25/20  Yes Karamalegos, Devonne Doughty, DO  Calcium Carb-Cholecalciferol (CALCIUM 600 + D PO) Take 1 tablet by mouth 2 (two) times daily.   Yes [provider]  Cholecalciferol (DIALYVITE VITAMIN D 5000) 125 MCG (5000 UT) capsule Take 5,000 Units by mouth daily.   Yes [provider]  Coenzyme Q10 (COQ10) 100 MG CAPS Take 100 mg by mouth daily.   Yes [provider]  gabapentin (NEURONTIN) 600 MG tablet TAKE 1 AND 1/2 TABLETS BY  MOUTH TWICE DAILY Patient taking differently: Take 600 mg by mouth 2 (two) times daily. 01/09/20  Yes Karamalegos, Alexander J, DO  lisinopril (ZESTRIL) 5 MG tablet TAKE 1 TABLET BY MOUTH  DAILY Patient taking differently: Take 5 mg by mouth every morning. 01/09/20  Yes Karamalegos, Devonne Doughty, DO  loratadine (CLARITIN) 10 MG tablet Take 10 mg by mouth every morning.   Yes [provider]  Magnesium Bisglycinate (MAG GLYCINATE PO) Take 2 tablets by mouth 2 (two) times daily.   Yes [provider]  sertraline (ZOLOFT) 100 MG tablet Take 1 tablet (100 mg total) by mouth daily. Patient taking differently: Take 100 mg by mouth every morning. 11/20/20  Yes Karamalegos, Devonne Doughty, DO  SUPER B COMPLEX/C PO Take 1 tablet by mouth daily.   Yes [provider]  Icosapent Ethyl (VASCEPA) 1 g CAPS Take 2 capsules (2 g total) by mouth 2 (two) times daily. 10/30/18 01/21/19  Mikey College, NP     Positive ROS: All other systems have been reviewed and were otherwise negative with the exception of those mentioned in the HPI and as above.  Physical Exam: General: Alert, no acute  distress Cardiovascular: No pedal edema. Heart is regular and without murmur.  Respiratory: No cyanosis, no use of accessory musculature. Lungs are clear. GI: No organomegaly, abdomen is soft and non-tender Skin: No lesions in the area of chief complaint Neurologic: Sensation intact distally Psychiatric: Patient is competent for consent with normal mood and affect Lymphatic: No axillary or cervical lymphadenopathy  MUSCULOSKELETAL: Wound is well-healed.  She has excellent range of motion in flexion and extension and rotation.  1 or 2 screw are palpable dorsally are slightly irritating.  Neurovascular status is good with no evidence of carpal tunnel problems.  Assessment: T84.84XA Pain due to internal  orthopedic prosth dev/grft, init  Plan: Plan for Procedure(s): HARDWARE REMOVAL (WRIST)  The risks benefits and alternatives were discussed with the patient including but not limited to the risks of nonoperative treatment, versus surgical intervention including infection, bleeding, nerve injury,  blood clots, cardiopulmonary complications, morbidity, mortality, among others, and they were willing to proceed.   Park Breed, MD [phone]   11/25/2020 11:22 AM

## 2020-11-26 ENCOUNTER — Encounter
Admission: RE | Admit: 2020-11-26 | Discharge: 2020-11-26 | Disposition: A | Discharge status: Home / Self Care | Payer: Managed Care, Other (non HMO) | Source: Ambulatory Visit | Attending: Specialist | Admitting: Specialist

## 2020-11-26 DIAGNOSIS — N1831 Chronic kidney disease, stage 3a: Secondary | ICD-10-CM | POA: Insufficient documentation

## 2020-11-26 DIAGNOSIS — I129 Hypertensive chronic kidney disease with stage 1 through stage 4 chronic kidney disease, or unspecified chronic kidney disease: Secondary | ICD-10-CM | POA: Insufficient documentation

## 2020-11-26 DIAGNOSIS — Z01810 Encounter for preprocedural cardiovascular examination: Secondary | ICD-10-CM

## 2020-11-26 DIAGNOSIS — Z01818 Encounter for other preprocedural examination: Secondary | ICD-10-CM | POA: Insufficient documentation

## 2020-11-26 LAB — BASIC METABOLIC PANEL
Anion gap: 9 (ref 5–15)
BUN: 17 mg/dL (ref 6–20)
CO2: 25 mmol/L (ref 22–32)
Calcium: 9.2 mg/dL (ref 8.9–10.3)
Chloride: 103 mmol/L (ref 98–111)
Creatinine, Ser: 1.31 mg/dL — ABNORMAL HIGH (ref 0.44–1.00)
GFR, Estimated: 47 mL/min — ABNORMAL LOW (ref >60)
Glucose, Bld: 95 mg/dL (ref 70–99)
Potassium: 3.9 mmol/L (ref 3.5–5.1)
Sodium: 137 mmol/L (ref 135–145)

## 2020-12-01 ENCOUNTER — Other Ambulatory Visit: Payer: Self-pay

## 2020-12-01 ENCOUNTER — Encounter: Payer: Self-pay | Admitting: Specialist

## 2020-12-01 ENCOUNTER — Ambulatory Visit: Payer: Managed Care, Other (non HMO) | Admitting: Certified Registered Nurse Anesthetist

## 2020-12-01 ENCOUNTER — Ambulatory Visit
Admission: RE | Admit: 2020-12-01 | Discharge: 2020-12-01 | Disposition: A | Discharge status: Home / Self Care | Payer: Managed Care, Other (non HMO) | Attending: Specialist | Admitting: Specialist

## 2020-12-01 ENCOUNTER — Encounter
Admission: RE | Disposition: A | Discharge status: Home / Self Care | Payer: Self-pay | Source: Home / Self Care | Attending: Specialist

## 2020-12-01 ENCOUNTER — Ambulatory Visit: Payer: Managed Care, Other (non HMO) | Admitting: Urgent Care

## 2020-12-01 ENCOUNTER — Ambulatory Visit: Payer: Managed Care, Other (non HMO)

## 2020-12-01 DIAGNOSIS — Z87891 Personal history of nicotine dependence: Secondary | ICD-10-CM | POA: Diagnosis not present

## 2020-12-01 DIAGNOSIS — Y792 Prosthetic and other implants, materials and accessory orthopedic devices associated with adverse incidents: Secondary | ICD-10-CM | POA: Diagnosis not present

## 2020-12-01 DIAGNOSIS — Z79899 Other long term (current) drug therapy: Secondary | ICD-10-CM | POA: Insufficient documentation

## 2020-12-01 DIAGNOSIS — T8484XA Pain due to internal orthopedic prosthetic devices, implants and grafts, initial encounter: Secondary | ICD-10-CM | POA: Insufficient documentation

## 2020-12-01 HISTORY — PX: HARDWARE REMOVAL: SHX979

## 2020-12-01 SURGERY — REMOVAL, HARDWARE
Anesthesia: General | Laterality: Right

## 2020-12-01 MED ORDER — CEFAZOLIN SODIUM-DEXTROSE 2-4 GM/100ML-% IV SOLN
2.0000 g | INTRAVENOUS | Status: AC
  Administered 2020-12-01: 2 g via INTRAVENOUS

## 2020-12-01 MED ORDER — OXYCODONE HCL 5 MG PO TABS
ORAL_TABLET | ORAL | Status: AC
  Filled 2020-12-01: qty 1

## 2020-12-01 MED ORDER — PROMETHAZINE HCL 25 MG/ML IJ SOLN: 6.2500 mg | INTRAMUSCULAR | Status: DC | PRN

## 2020-12-01 MED ORDER — LIDOCAINE HCL (PF) 2 % IJ SOLN
INTRAMUSCULAR | Status: AC
  Filled 2020-12-01: qty 5

## 2020-12-01 MED ORDER — GABAPENTIN 300 MG PO CAPS: 300.0000 mg | ORAL_CAPSULE | ORAL | Status: AC

## 2020-12-01 MED ORDER — FENTANYL CITRATE (PF) 100 MCG/2ML IJ SOLN: 25.0000 ug | INTRAMUSCULAR | Status: DC | PRN

## 2020-12-01 MED ORDER — CLINDAMYCIN PHOSPHATE 600 MG/50ML IV SOLN
INTRAVENOUS | Status: AC
  Filled 2020-12-01: qty 50

## 2020-12-01 MED ORDER — MELOXICAM 15 MG PO TABS: 15.0000 mg | ORAL_TABLET | Freq: Every day | ORAL | 3 refills | Status: DC

## 2020-12-01 MED ORDER — PHENYLEPHRINE HCL-NACL 20-0.9 MG/250ML-% IV SOLN
INTRAVENOUS | Status: AC
  Filled 2020-12-01: qty 250

## 2020-12-01 MED ORDER — ACETAMINOPHEN 10 MG/ML IV SOLN
INTRAVENOUS | Status: AC
  Filled 2020-12-01: qty 100

## 2020-12-01 MED ORDER — CHLORHEXIDINE GLUCONATE CLOTH 2 % EX PADS: 6.0000 | MEDICATED_PAD | Freq: Once | CUTANEOUS | Status: DC

## 2020-12-01 MED ORDER — HYDROCODONE-ACETAMINOPHEN 5-325 MG PO TABS: 1.0000 | ORAL_TABLET | Freq: Four times a day (QID) | ORAL | 0 refills | Status: DC | PRN

## 2020-12-01 MED ORDER — MELOXICAM 7.5 MG PO TABS
ORAL_TABLET | ORAL | Status: AC
  Administered 2020-12-01: 15 mg via ORAL
  Filled 2020-12-01: qty 2

## 2020-12-01 MED ORDER — HYDROCODONE-ACETAMINOPHEN 5-325 MG PO TABS: 1.0000 | ORAL_TABLET | Freq: Once | ORAL | Status: DC

## 2020-12-01 MED ORDER — CHLORHEXIDINE GLUCONATE 0.12 % MT SOLN: 15.0000 mL | Freq: Once | OROMUCOSAL | Status: AC

## 2020-12-01 MED ORDER — MIDAZOLAM HCL 2 MG/2ML IJ SOLN
INTRAMUSCULAR | Status: AC
  Filled 2020-12-01: qty 2

## 2020-12-01 MED ORDER — LIDOCAINE HCL (CARDIAC) PF 100 MG/5ML IV SOSY
PREFILLED_SYRINGE | INTRAVENOUS | Status: DC | PRN
  Administered 2020-12-01: 100 mg via INTRAVENOUS

## 2020-12-01 MED ORDER — FENTANYL CITRATE (PF) 100 MCG/2ML IJ SOLN
INTRAMUSCULAR | Status: DC | PRN
  Administered 2020-12-01 (×2): 50 ug via INTRAVENOUS

## 2020-12-01 MED ORDER — ONDANSETRON HCL 4 MG/2ML IJ SOLN
INTRAMUSCULAR | Status: DC | PRN
  Administered 2020-12-01: 4 mg via INTRAVENOUS

## 2020-12-01 MED ORDER — DEXAMETHASONE SODIUM PHOSPHATE 10 MG/ML IJ SOLN
INTRAMUSCULAR | Status: DC | PRN
  Administered 2020-12-01: 10 mg via INTRAVENOUS

## 2020-12-01 MED ORDER — CLINDAMYCIN PHOSPHATE 600 MG/50ML IV SOLN
600.0000 mg | INTRAVENOUS | Status: AC
  Administered 2020-12-01: 600 mg via INTRAVENOUS

## 2020-12-01 MED ORDER — FAMOTIDINE 20 MG PO TABS: 20.0000 mg | ORAL_TABLET | Freq: Once | ORAL | Status: AC

## 2020-12-01 MED ORDER — GABAPENTIN 400 MG PO CAPS: 400.0000 mg | ORAL_CAPSULE | Freq: Three times a day (TID) | ORAL | 3 refills | Status: DC

## 2020-12-01 MED ORDER — NEOMYCIN-POLYMYXIN B GU 40-200000 IR SOLN
Status: DC | PRN
  Administered 2020-12-01: 2 mL

## 2020-12-01 MED ORDER — FENTANYL CITRATE (PF) 100 MCG/2ML IJ SOLN
INTRAMUSCULAR | Status: AC
  Filled 2020-12-01: qty 2

## 2020-12-01 MED ORDER — ACETAMINOPHEN 10 MG/ML IV SOLN
INTRAVENOUS | Status: DC | PRN
  Administered 2020-12-01: 1000 mg via INTRAVENOUS

## 2020-12-01 MED ORDER — ORAL CARE MOUTH RINSE: 15.0000 mL | Freq: Once | OROMUCOSAL | Status: AC

## 2020-12-01 MED ORDER — MIDAZOLAM HCL 2 MG/2ML IJ SOLN
INTRAMUSCULAR | Status: DC | PRN
  Administered 2020-12-01: 2 mg via INTRAVENOUS

## 2020-12-01 MED ORDER — BUPIVACAINE HCL (PF) 0.5 % IJ SOLN
INTRAMUSCULAR | Status: AC
  Filled 2020-12-01: qty 30

## 2020-12-01 MED ORDER — LACTATED RINGERS IV SOLN: INTRAVENOUS | Status: DC

## 2020-12-01 MED ORDER — GLYCOPYRROLATE 0.2 MG/ML IJ SOLN
INTRAMUSCULAR | Status: DC | PRN
  Administered 2020-12-01: .2 mg via INTRAVENOUS

## 2020-12-01 MED ORDER — PHENYLEPHRINE HCL (PRESSORS) 10 MG/ML IV SOLN
INTRAVENOUS | Status: DC | PRN
  Administered 2020-12-01: 160 ug via INTRAVENOUS
  Administered 2020-12-01: 80 ug via INTRAVENOUS
  Administered 2020-12-01 (×3): 160 ug via INTRAVENOUS

## 2020-12-01 MED ORDER — NEOMYCIN-POLYMYXIN B GU 40-200000 IR SOLN
Status: AC
  Filled 2020-12-01: qty 2

## 2020-12-01 MED ORDER — ONDANSETRON HCL 4 MG/2ML IJ SOLN
INTRAMUSCULAR | Status: AC
  Filled 2020-12-01: qty 2

## 2020-12-01 MED ORDER — PROPOFOL 10 MG/ML IV BOLUS
INTRAVENOUS | Status: AC
  Filled 2020-12-01: qty 20

## 2020-12-01 MED ORDER — GABAPENTIN 300 MG PO CAPS
ORAL_CAPSULE | ORAL | Status: AC
  Administered 2020-12-01: 300 mg via ORAL
  Filled 2020-12-01: qty 1

## 2020-12-01 MED ORDER — MELOXICAM 7.5 MG PO TABS: 15.0000 mg | ORAL_TABLET | ORAL | Status: AC

## 2020-12-01 MED ORDER — HYDROCODONE-ACETAMINOPHEN 5-325 MG PO TABS
ORAL_TABLET | ORAL | Status: AC
  Filled 2020-12-01: qty 1

## 2020-12-01 MED ORDER — CHLORHEXIDINE GLUCONATE 0.12 % MT SOLN
OROMUCOSAL | Status: AC
  Administered 2020-12-01: 15 mL via OROMUCOSAL
  Filled 2020-12-01: qty 15

## 2020-12-01 MED ORDER — BUPIVACAINE HCL (PF) 0.5 % IJ SOLN
INTRAMUSCULAR | Status: DC | PRN
  Administered 2020-12-01: 27 mL

## 2020-12-01 MED ORDER — CEFAZOLIN SODIUM-DEXTROSE 2-4 GM/100ML-% IV SOLN
INTRAVENOUS | Status: AC
  Filled 2020-12-01: qty 100

## 2020-12-01 MED ORDER — FAMOTIDINE 20 MG PO TABS
ORAL_TABLET | ORAL | Status: AC
  Administered 2020-12-01: 20 mg via ORAL
  Filled 2020-12-01: qty 1

## 2020-12-01 MED ORDER — OXYCODONE HCL 5 MG PO TABS
5.0000 mg | ORAL_TABLET | Freq: Once | ORAL | Status: AC
  Administered 2020-12-01: 5 mg via ORAL

## 2020-12-01 MED ORDER — PROPOFOL 10 MG/ML IV BOLUS
INTRAVENOUS | Status: DC | PRN
  Administered 2020-12-01: 200 mg via INTRAVENOUS

## 2020-12-01 MED ORDER — 0.9 % SODIUM CHLORIDE (POUR BTL) OPTIME
TOPICAL | Status: DC | PRN
  Administered 2020-12-01: 500 mL

## 2020-12-01 SURGICAL SUPPLY — 37 items
APL PRP STRL LF DISP 70% ISPRP (MISCELLANEOUS) ×2
BNDG ESMARK 4X12 TAN STRL LF (GAUZE/BANDAGES/DRESSINGS) ×3 IMPLANT
CHLORAPREP W/TINT 26 (MISCELLANEOUS) ×4 IMPLANT
CUFF TOURN SGL QUICK 18X4 (TOURNIQUET CUFF) IMPLANT
CUFF TOURN SGL QUICK 24 (TOURNIQUET CUFF)
CUFF TRNQT CYL 24X4X16.5-23 (TOURNIQUET CUFF) IMPLANT
DRAPE FLUOR MINI C-ARM 54X84 (DRAPES) ×2 IMPLANT
ELECT REM PT RETURN 9FT ADLT (ELECTROSURGICAL) ×2
ELECTRODE REM PT RTRN 9FT ADLT (ELECTROSURGICAL) ×1 IMPLANT
GAUZE 4X4 16PLY ~~LOC~~+RFID DBL (SPONGE) ×2 IMPLANT
GAUZE SPONGE 4X4 12PLY STRL (GAUZE/BANDAGES/DRESSINGS) ×2 IMPLANT
GAUZE XEROFORM 1X8 LF (GAUZE/BANDAGES/DRESSINGS) ×2 IMPLANT
GLOVE SURG ORTHO LTX SZ8 (GLOVE) ×2 IMPLANT
GOWN STRL REUS W/ TWL XL LVL3 (GOWN DISPOSABLE) ×1 IMPLANT
GOWN STRL REUS W/TWL LRG LVL4 (GOWN DISPOSABLE) ×2 IMPLANT
GOWN STRL REUS W/TWL XL LVL3 (GOWN DISPOSABLE) ×2
KIT TURNOVER KIT A (KITS) ×2 IMPLANT
MANIFOLD NEPTUNE II (INSTRUMENTS) ×2 IMPLANT
NS IRRIG 500ML POUR BTL (IV SOLUTION) ×2 IMPLANT
PACK EXTREMITY ARMC (MISCELLANEOUS) ×2 IMPLANT
PADDING CAST 4IN STRL (MISCELLANEOUS) ×2
PADDING CAST BLEND 4X4 STRL (MISCELLANEOUS) ×2 IMPLANT
SOL PREP PVP 2OZ (MISCELLANEOUS) ×2
SOLUTION PREP PVP 2OZ (MISCELLANEOUS) ×1 IMPLANT
SPLINT CAST 1 STEP 3X12 (MISCELLANEOUS) ×1 IMPLANT
STAPLER SKIN PROX 35W (STAPLE) ×1 IMPLANT
STOCKINETTE BIAS CUT 4 980044 (GAUZE/BANDAGES/DRESSINGS) ×2 IMPLANT
STOCKINETTE BIAS CUT 6 980064 (GAUZE/BANDAGES/DRESSINGS) ×1 IMPLANT
STOCKINETTE STRL 6IN 960660 (GAUZE/BANDAGES/DRESSINGS) ×2 IMPLANT
SUT ETHILON 4-0 (SUTURE) ×2
SUT ETHILON 4-0 FS2 18XMFL BLK (SUTURE) ×1
SUT VIC AB 3-0 SH 27 (SUTURE) ×2
SUT VIC AB 3-0 SH 27X BRD (SUTURE) ×1 IMPLANT
SUT VIC AB 4-0 SH 27 (SUTURE) ×2
SUT VIC AB 4-0 SH 27XANBCTRL (SUTURE) IMPLANT
SUTURE ETHLN 4-0 FS2 18XMF BLK (SUTURE) ×1 IMPLANT
WATER STERILE IRR 500ML POUR (IV SOLUTION) ×1 IMPLANT

## 2020-12-01 NOTE — Anesthesia Procedure Notes (Signed)
Procedure Name: Intubation Date/Time: 12/01/2020 7:43 AM Performed by: Tollie Eth, CRNA Pre-anesthesia Checklist: Patient identified, Patient being monitored, Timeout performed, Emergency Drugs available and Suction available Patient Re-evaluated:Patient Re-evaluated prior to induction Oxygen Delivery Method: Circle system utilized Preoxygenation: Pre-oxygenation with 100% oxygen Induction Type: IV induction Ventilation: Mask ventilation without difficulty LMA: LMA inserted LMA Size: 3.5 Number of attempts: 1 Placement Confirmation: ETT inserted through vocal cords under direct vision, positive ETCO2 and breath sounds checked- equal and bilateral Tube secured with: Tape Dental Injury: Teeth and Oropharynx as per pre-operative assessment

## 2020-12-01 NOTE — Transfer of Care (Signed)
Immediate Anesthesia Transfer of Care Note  Patient: Rachel Townsend  Procedure(s) Performed: HARDWARE REMOVAL (WRIST) (Right)  Patient Location: PACU  Anesthesia Type:General  Level of Consciousness: drowsy  Airway & Oxygen Therapy: Patient Spontanous Breathing and Patient connected to face mask oxygen  Post-op Assessment: Report given to RN and Post -op Vital signs reviewed and stable  Post vital signs: Reviewed and stable  Last Vitals:  Vitals Value Taken Time  BP 106/63 12/01/20 0931  Temp    Pulse 77 12/01/20 0934  Resp 12 12/01/20 0934  SpO2 98 % 12/01/20 0934  Vitals shown include unvalidated device data.  Last Pain:  Vitals:   12/01/20 0628  TempSrc: Temporal  PainSc: 0-No pain         Complications: No notable events documented.

## 2020-12-01 NOTE — H&P (Signed)
THE PATIENT WAS SEEN PRIOR TO SURGERY TODAY.  HISTORY, ALLERGIES, HOME MEDICATIONS AND OPERATIVE PROCEDURE WERE REVIEWED. RISKS AND BENEFITS OF SURGERY DISCUSSED WITH PATIENT AGAIN.  NO CHANGES FROM INITIAL HISTORY AND PHYSICAL NOTED.    

## 2020-12-01 NOTE — Anesthesia Preprocedure Evaluation (Signed)
Anesthesia Evaluation  Patient identified by MRN, date of birth, ID band Patient awake    Reviewed: Allergy & Precautions, NPO status , Patient's Chart, lab work & pertinent test results  History of Anesthesia Complications Negative for: history of anesthetic complications  Airway Mallampati: III       Dental  (+) Dental Advidsory Given, Teeth Intact   Pulmonary neg shortness of breath, sleep apnea and Continuous Positive Airway Pressure Ventilation , neg COPD, neg recent URI, Not current smoker, former smoker,    Pulmonary exam normal        Cardiovascular hypertension, Pt. on medications (-) angina(-) Past MI and (-) CHF Normal cardiovascular exam(-) dysrhythmias (-) Valvular Problems/Murmurs     Neuro/Psych neg Seizures PSYCHIATRIC DISORDERS Depression  Neuromuscular disease (fibromyalgia)    GI/Hepatic Neg liver ROS, neg GERD  ,  Endo/Other  neg diabetesMorbid obesity  Renal/GU Renal InsufficiencyRenal disease     Musculoskeletal   Abdominal   Peds  Hematology   Anesthesia Other Findings Past Medical History: No date: Allergy No date: Family history of adverse reaction to anesthesia     Comment:  sister- PONV No date: Fibromyalgia No date: GERD (gastroesophageal reflux disease) No date: Hypertension No date: Pre-diabetes No date: Sleep apnea     Comment:  uses CPAP No date: Stage III chronic kidney disease (HCC) No date: Wrist pain   Reproductive/Obstetrics                             Anesthesia Physical  Anesthesia Plan  ASA: 3  Anesthesia Plan: General   Post-op Pain Management:    Induction: Intravenous  PONV Risk Score and Plan: 3 and Ondansetron, Dexamethasone and Midazolam  Airway Management Planned: LMA  Additional Equipment:   Intra-op Plan:   Post-operative Plan: Extubation in OR  Informed Consent: I have reviewed the patients History and Physical,  chart, labs and discussed the procedure including the risks, benefits and alternatives for the proposed anesthesia with the patient or authorized representative who has indicated his/her understanding and acceptance.       Plan Discussed with:   Anesthesia Plan Comments:         Anesthesia Quick Evaluation

## 2020-12-01 NOTE — Op Note (Signed)
12/01/2020  9:32 AM  PATIENT:  Rachel Townsend  60 y.o. female  PRE-OPERATIVE DIAGNOSIS:  T84.84XA Pain due to internal orthopedic prosth dev/grft, init right wrist  POST-OPERATIVE DIAGNOSIS:  Same  PROCEDURE:  HARDWARE REMOVAL (WRIST) right SURGEON:  Reef Achterberg E Kaydenn Mclear MD  ASST: None  ANESTHESIA:   General  EBL: None  TOURNIQUET TIME: 71 minutes  OPERATIVE FINDINGS: Solidly healed fracture with exuberant bone overlying the plate and screws  OPERATIVE PROCEDURE: The patient was brought to the operating room and placed in the supine position on the operating room table.  The right arm was prepped and draped in sterile fashion.  Esmarch was applied and tourniquet inflated to 250 mmHg.  Using loupe magnification the previous incision was reopened.  Dissection was carried out carefully through subcutaneous tissue using tenotomy scissors and cautery.  Flexor carpi radialis tendon was identified and dissection was carried out radial to this.  Dissection was carried out down to the pronator muscle which was released.  The plate and screws were identified by blunt and sharp dissection.  There was and incredible amount of new bone formation over and around the plate and screws.  Small osteotome was needed to remove all the excessive bone which was quite tedious.  Dental pick was root used to remove bone and tissue from the screw heads.  The 3 pegs and the distal portion of the plate were then removed without difficulty.  The 3 screws in the shaft were also removed after carefully removing all obstacles to removal.  The plate was seen to be embedded into the bone to the throughout its length.  A small osteotome was used both radially and ulnarly along the plate to gently lifted up out of its bed.  It was then removed without difficulty.  Radius itself was well-healed with very good bone.  The radiocarpal joint was examined and there was some small debris there which was removed and irrigated as well.   Fluoroscopy was brought in and all hardware was removed.  The radius was intact.  There was mild step-off at the distal radius from the fracture initially.  The wound was thoroughly irrigated.  The subcutaneous tissue was closed with 4-0 Vicryl and the skin was closed with staples.  Half percent Marcaine was placed in the wounds for postop anesthesia.  Dry sterile hand dressing with volar splint was applied.  Tourniquet was deflated with good return of blood flow to the hand.  Patient was then awakened and taken recovery in good condition.  Earnestine Leys, MD

## 2020-12-01 NOTE — Discharge Instructions (Signed)
AMBULATORY SURGERY  ?DISCHARGE INSTRUCTIONS ? ? ?The drugs that you were given will stay in your system until tomorrow so for the next 24 hours you should not: ? ?Drive an automobile ?Make any legal decisions ?Drink any alcoholic beverage ? ? ?You may resume regular meals tomorrow.  Today it is better to start with liquids and gradually work up to solid foods. ? ?You may eat anything you prefer, but it is better to start with liquids, then soup and crackers, and gradually work up to solid foods. ? ? ?Please notify your doctor immediately if you have any unusual bleeding, trouble breathing, redness and pain at the surgery site, drainage, fever, or pain not relieved by medication. ? ? ? ?Additional Instructions: ? ? ? ?Please contact your physician with any problems or Same Day Surgery at [phone], Monday through Friday 6 am to 4 pm, or [place] [facility] [place] Main number at [phone].  ?

## 2020-12-02 ENCOUNTER — Encounter: Payer: Self-pay | Admitting: Specialist

## 2020-12-02 NOTE — Anesthesia Postprocedure Evaluation (Signed)
Anesthesia Post Note  Patient: Rachel Townsend  Procedure(s) Performed: HARDWARE REMOVAL (WRIST) (Right)  Patient location during evaluation: PACU Anesthesia Type: General Level of consciousness: awake and alert Pain management: pain level controlled Vital Signs Assessment: post-procedure vital signs reviewed and stable Respiratory status: spontaneous breathing, nonlabored ventilation, respiratory function stable and patient connected to nasal cannula oxygen Cardiovascular status: blood pressure returned to baseline and stable Postop Assessment: no apparent nausea or vomiting Anesthetic complications: no   No notable events documented.   Last Vitals:  Vitals:   12/01/20 1007 12/01/20 1017  BP:  128/75  Pulse: 75 73  Resp: 15 15  Temp: (!) 36.1 C (!) 36.1 C  SpO2: 98% 96%    Last Pain:  Vitals:   12/01/20 1017  TempSrc: Temporal  PainSc: 4                  Martha Clan

## 2020-12-07 IMAGING — MG DIGITAL SCREENING BILAT W/ TOMO
6 of 10 series · 6 of 30 positions shown · non-contrast
Comparison: Previous exam(s).

CLINICAL DATA: Screening.

EXAM:
DIGITAL SCREENING BILATERAL MAMMOGRAM WITH TOMO AND CAD

[L CC synth-2D]
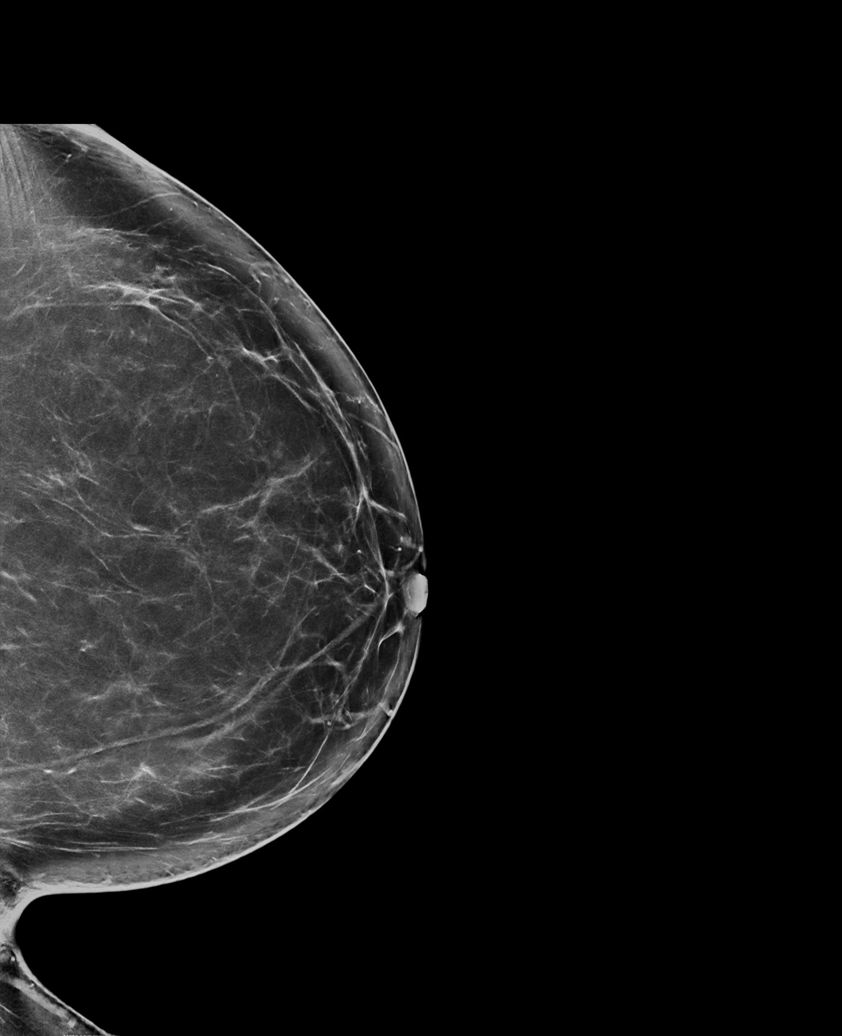

[R CC synth-2D]
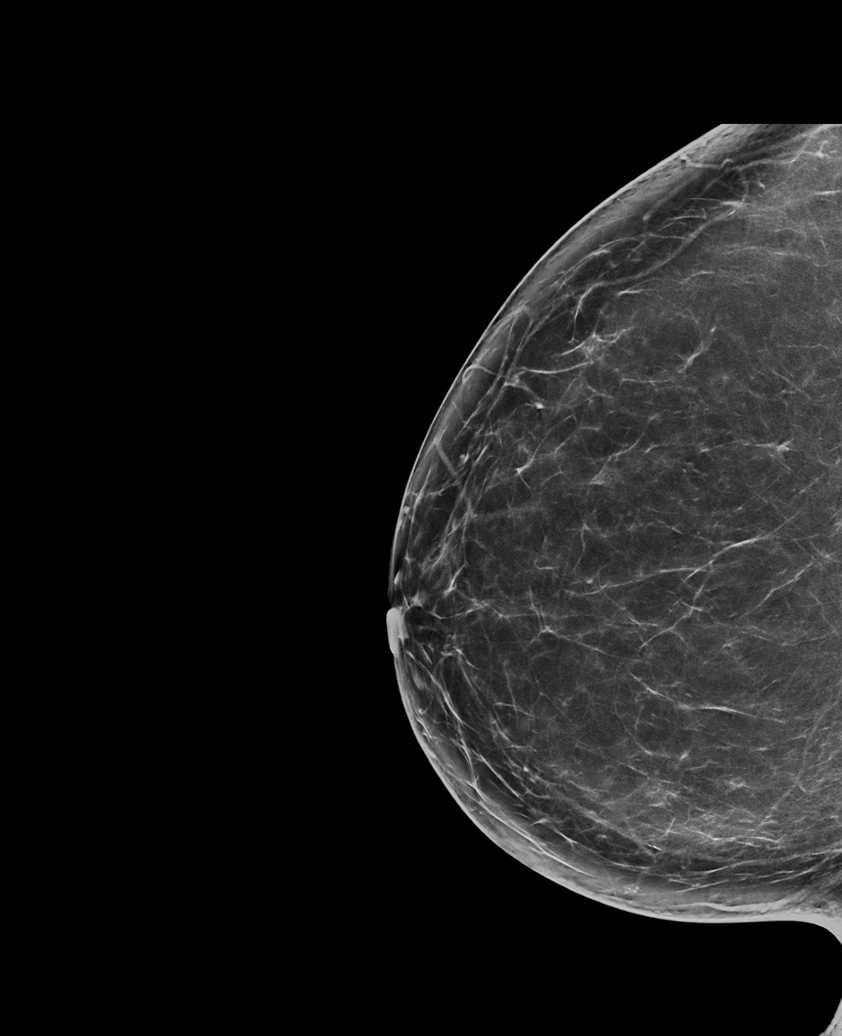

[R MLO synth-2D (1 of 2)]
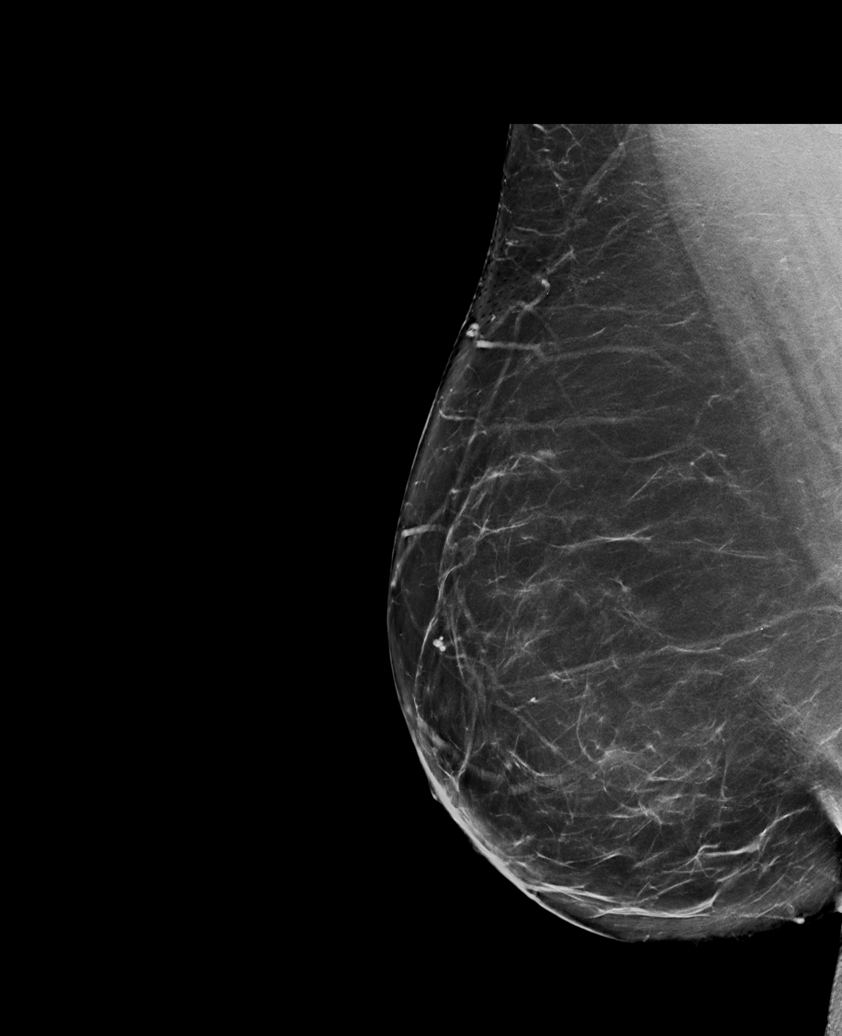

[L MLO synth-2D]
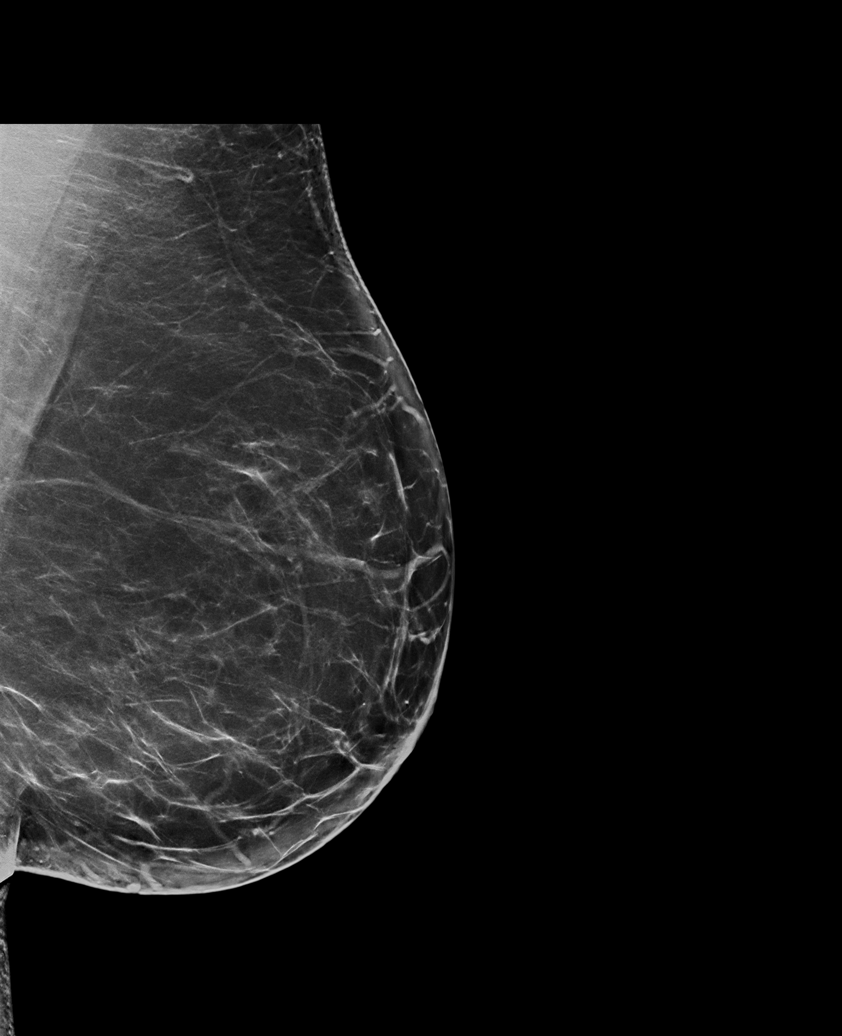

[R MLO synth-2D (2 of 2)]
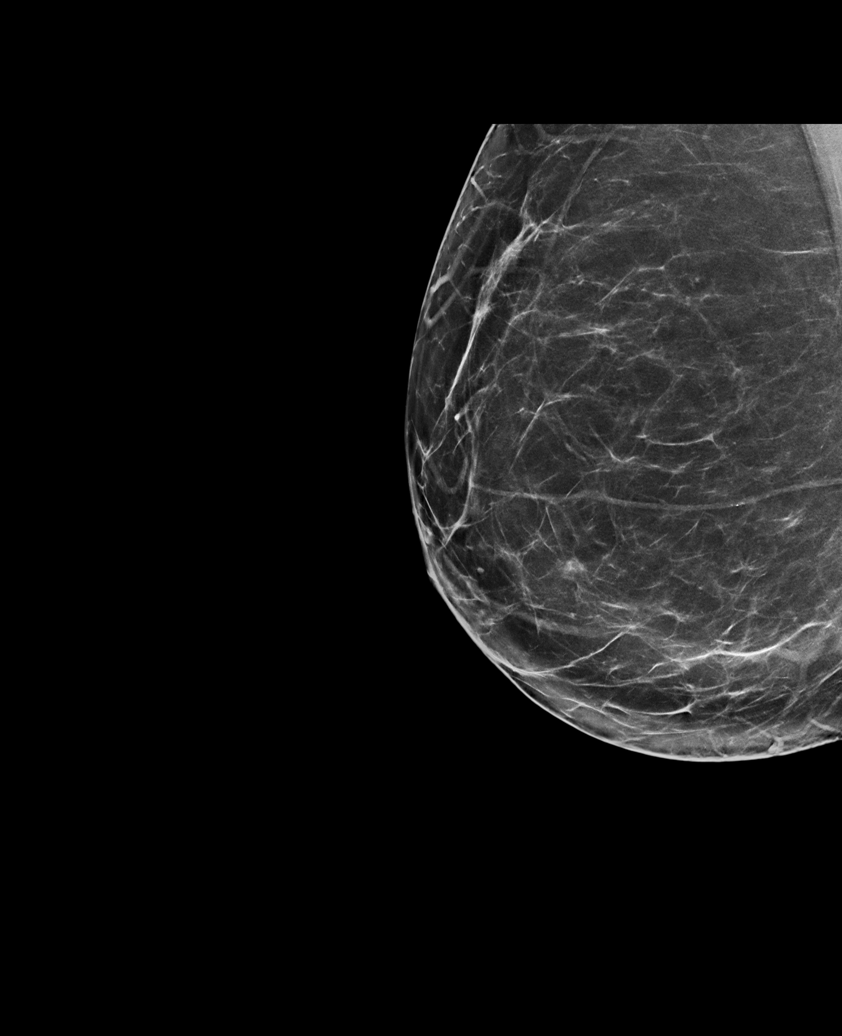

[R CC tomo · tomo slice 43/84.0]
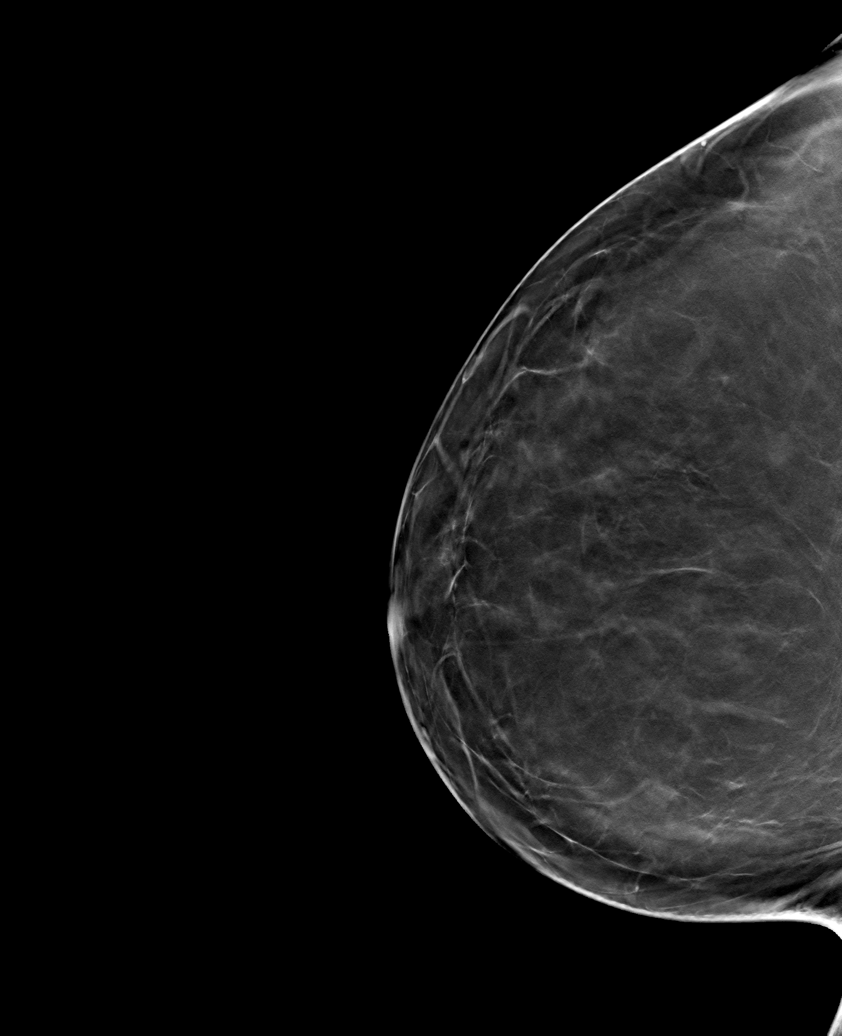

[6 of 30 positions shown; findings below may reference images not displayed]

ACR Breast Density Category b: There are scattered areas of
fibroglandular density.
FINDINGS: In the right breast, a possible asymmetry warrants further
evaluation. In the left breast, no findings suspicious for
malignancy. Images were processed with CAD.
IMPRESSION: Further evaluation is suggested for possible asymmetry in the right
breast.

RECOMMENDATION:
Diagnostic mammogram and possibly ultrasound of the right breast.
(Code:[id number])

The patient will be contacted regarding the findings, and additional
imaging will be scheduled.

BI-RADS CATEGORY  0: Incomplete. Need additional imaging evaluation
and/or prior mammograms for comparison.

## 2021-01-10 ENCOUNTER — Other Ambulatory Visit: Payer: Self-pay | Admitting: Family Medicine

## 2021-01-10 DIAGNOSIS — E782 Mixed hyperlipidemia: Secondary | ICD-10-CM

## 2021-01-10 DIAGNOSIS — F331 Major depressive disorder, recurrent, moderate: Secondary | ICD-10-CM

## 2021-01-10 DIAGNOSIS — M797 Fibromyalgia: Secondary | ICD-10-CM

## 2021-01-10 NOTE — Telephone Encounter (Signed)
Requested Prescriptions  Pending Prescriptions Disp Refills  . atorvastatin (LIPITOR) 20 MG tablet [Pharmacy Med Name: Atorvastatin Calcium 20 MG Oral Tablet] 90 tablet 3    Sig: TAKE 1 TABLET BY MOUTH  DAILY     Cardiovascular:  Antilipid - Statins Failed - 01/10/2021  5:33 AM      Failed - Total Cholesterol in normal range and within 360 days    Cholesterol, Total  Date Value Ref Range Status  10/24/2018 203 (H) 100 - 199 mg/dL Final   Cholesterol  Date Value Ref Range Status  09/05/2019 221 (A) 0 - 200 Final         Failed - LDL in normal range and within 360 days    LDL Chol Calc (NIH)  Date Value Ref Range Status  10/24/2018 118 (H) 0 - 99 mg/dL Final   LDL Cholesterol  Date Value Ref Range Status  09/05/2019 132  Final         Failed - HDL in normal range and within 360 days    HDL  Date Value Ref Range Status  09/05/2019 54 35 - 70 Final  10/24/2018 58 >39 mg/dL Final         Failed - Triglycerides in normal range and within 360 days    Triglycerides  Date Value Ref Range Status  09/05/2019 200 (A) 40 - 160 Final         Passed - Patient is not pregnant      Passed - Valid encounter within last 12 months    Recent Outpatient Visits          1 month ago Annual physical exam   [place], Devonne Doughty, DO   9 months ago Benign hypertension with CKD (chronic kidney disease) stage III   [place], Devonne Doughty, DO   1 year ago Annual physical exam   [facility] Olin Hauser, DO   2 years ago Encounter for annual physical exam   [facility] Merrilyn Puma, Jerrel Ivory, NP   3 years ago Annual physical exam   [place], Devonne Doughty, DO             Signed Prescriptions Disp Refills   gabapentin (NEURONTIN) 600 MG tablet 270 tablet 3    Sig: TAKE 1 AND 1/2 TABLETS BY  MOUTH TWICE DAILY     Neurology: Anticonvulsants - gabapentin  Passed - 01/10/2021  5:33 AM      Passed - Valid encounter within last 12 months    Recent Outpatient Visits          1 month ago Annual physical exam   [place], Devonne Doughty, DO   9 months ago Benign hypertension with CKD (chronic kidney disease) stage III   [place], Devonne Doughty, DO   1 year ago Annual physical exam   [facility] Olin Hauser, DO   2 years ago Encounter for annual physical exam   [facility] Mikey College, NP   3 years ago Annual physical exam   [facility] [place], Devonne Doughty, DO              buPROPion (WELLBUTRIN XL) 300 MG 24 hr tablet 90 tablet 3    Sig: TAKE 1 TABLET BY MOUTH  DAILY     Psychiatry: Antidepressants - bupropion Passed - 01/10/2021  5:33 AM      Passed - Completed PHQ-2 or PHQ-9 in the last 360 days      Passed - Last BP in normal range    BP Readings from Last 1 Encounters:  12/01/20 128/75         Passed - Valid encounter within last 6 months    Recent Outpatient Visits          1 month ago Annual physical exam   [place], DO   9 months ago Benign hypertension with CKD (chronic kidney disease) stage III   [place], DO   1 year ago Annual physical exam   [facility] Olin Hauser, DO   2 years ago Encounter for annual physical exam   [facility] Mikey College, NP   3 years ago Annual physical exam   [place], Devonne Doughty, DO

## 2021-01-10 NOTE — Telephone Encounter (Signed)
Requested Prescriptions  Pending Prescriptions Disp Refills  . gabapentin (NEURONTIN) 600 MG tablet [Pharmacy Med Name: Gabapentin 600 MG Oral Tablet] 270 tablet 3    Sig: TAKE 1 AND 1/2 TABLETS BY  MOUTH TWICE DAILY     Neurology: Anticonvulsants - gabapentin Passed - 01/10/2021  5:33 AM      Passed - Valid encounter within last 12 months    Recent Outpatient Visits          1 month ago Annual physical exam   [place], DO   9 months ago Benign hypertension with CKD (chronic kidney disease) stage III   [place], Devonne Doughty, DO   1 year ago Annual physical exam   [facility] Olin Hauser, DO   2 years ago Encounter for annual physical exam   [facility] Merrilyn Puma, Jerrel Ivory, NP   3 years ago Annual physical exam   [place], Devonne Doughty, DO             . atorvastatin (LIPITOR) 20 MG tablet [Pharmacy Med Name: Atorvastatin Calcium 20 MG Oral Tablet] 90 tablet 3    Sig: TAKE 1 TABLET BY MOUTH  DAILY     Cardiovascular:  Antilipid - Statins Failed - 01/10/2021  5:33 AM      Failed - Total Cholesterol in normal range and within 360 days    Cholesterol, Total  Date Value Ref Range Status  10/24/2018 203 (H) 100 - 199 mg/dL Final   Cholesterol  Date Value Ref Range Status  09/05/2019 221 (A) 0 - 200 Final         Failed - LDL in normal range and within 360 days    LDL Chol Calc (NIH)  Date Value Ref Range Status  10/24/2018 118 (H) 0 - 99 mg/dL Final   LDL Cholesterol  Date Value Ref Range Status  09/05/2019 132  Final         Failed - HDL in normal range and within 360 days    HDL  Date Value Ref Range Status  09/05/2019 54 35 - 70 Final  10/24/2018 58 >39 mg/dL Final         Failed - Triglycerides in normal range and within 360 days    Triglycerides  Date Value Ref Range Status  09/05/2019 200 (A) 40 - 160 Final          Passed - Patient is not pregnant      Passed - Valid encounter within last 12 months    Recent Outpatient Visits          1 month ago Annual physical exam   [place], DO   9 months ago Benign hypertension with CKD (chronic kidney disease) stage III   [place], Devonne Doughty, DO   1 year ago Annual physical exam   [facility] Olin Hauser, DO   2 years ago Encounter for annual physical exam   [facility] Merrilyn Puma, Jerrel Ivory, NP   3 years ago Annual physical exam   [facility], Devonne Doughty, DO             . buPROPion (WELLBUTRIN XL) 300 MG 24 hr tablet [Pharmacy Med Name: buPROPion HCl ER (XL) 300 MG Oral Tablet Extended Release 24 Hour] 90 tablet 3    Sig:  TAKE 1 TABLET BY MOUTH  DAILY     Psychiatry: Antidepressants - bupropion Passed - 01/10/2021  5:33 AM      Passed - Completed PHQ-2 or PHQ-9 in the last 360 days      Passed - Last BP in normal range    BP Readings from Last 1 Encounters:  12/01/20 128/75         Passed - Valid encounter within last 6 months    Recent Outpatient Visits          1 month ago Annual physical exam   [place], DO   9 months ago Benign hypertension with CKD (chronic kidney disease) stage III   [place], DO   1 year ago Annual physical exam   [facility] Olin Hauser, DO   2 years ago Encounter for annual physical exam   [facility] Mikey College, NP   3 years ago Annual physical exam   [place], Devonne Doughty, DO

## 2021-01-12 LAB — CBC WITH DIFFERENTIAL/PLATELET
Basophils Absolute: 0 x10E3/uL (ref 0.0–0.2)
Basos: 1 %
EOS (ABSOLUTE): 0.2 x10E3/uL (ref 0.0–0.4)
Eos: 3 %
Hematocrit: 42.3 % (ref 34.0–46.6)
Hemoglobin: 14.2 g/dL (ref 11.1–15.9)
Immature Grans (Abs): 0 x10E3/uL (ref 0.0–0.1)
Immature Granulocytes: 0 %
Lymphocytes Absolute: 1.8 x10E3/uL (ref 0.7–3.1)
Lymphs: 28 %
MCH: 30.3 pg (ref 26.6–33.0)
MCHC: 33.6 g/dL (ref 31.5–35.7)
MCV: 90 fL (ref 79–97)
Monocytes Absolute: 0.6 x10E3/uL (ref 0.1–0.9)
Monocytes: 9 %
Neutrophils Absolute: 3.7 x10E3/uL (ref 1.4–7.0)
Neutrophils: 59 %
Platelets: 291 x10E3/uL (ref 150–450)
RBC: 4.69 x10E6/uL (ref 3.77–5.28)
RDW: 13.8 % (ref 11.7–15.4)
WBC: 6.4 x10E3/uL (ref 3.4–10.8)

## 2021-01-12 LAB — COMPREHENSIVE METABOLIC PANEL WITH GFR
ALT: 32 IU/L (ref 0–32)
AST: 22 IU/L (ref 0–40)
Albumin/Globulin Ratio: 2 (ref 1.2–2.2)
Albumin: 4.3 g/dL (ref 3.8–4.8)
Alkaline Phosphatase: 110 IU/L (ref 44–121)
BUN/Creatinine Ratio: 15 (ref 12–28)
BUN: 17 mg/dL (ref 8–27)
Bilirubin Total: 0.4 mg/dL (ref 0.0–1.2)
CO2: 23 mmol/L (ref 20–29)
Calcium: 9.5 mg/dL (ref 8.7–10.3)
Chloride: 102 mmol/L (ref 96–106)
Creatinine, Ser: 1.13 mg/dL — ABNORMAL HIGH (ref 0.57–1.00)
Globulin, Total: 2.2 g/dL (ref 1.5–4.5)
Glucose: 100 mg/dL — ABNORMAL HIGH (ref 70–99)
Potassium: 4.9 mmol/L (ref 3.5–5.2)
Sodium: 138 mmol/L (ref 134–144)
Total Protein: 6.5 g/dL (ref 6.0–8.5)
eGFR: 55 mL/min/1.73 — ABNORMAL LOW

## 2021-01-12 LAB — TSH: TSH: 3.32 u[IU]/mL (ref 0.450–4.500)

## 2021-01-12 LAB — HEMOGLOBIN A1C
Est. average glucose Bld gHb Est-mCnc: 120 mg/dL
Hgb A1c MFr Bld: 5.8 % — ABNORMAL HIGH (ref 4.8–5.6)

## 2021-01-12 LAB — LIPID PANEL
Chol/HDL Ratio: 3.9 ratio (ref 0.0–4.4)
Cholesterol, Total: 213 mg/dL — ABNORMAL HIGH (ref 100–199)
HDL: 55 mg/dL
LDL Chol Calc (NIH): 132 mg/dL — ABNORMAL HIGH (ref 0–99)
Triglycerides: 146 mg/dL (ref 0–149)
VLDL Cholesterol Cal: 26 mg/dL (ref 5–40)

## 2021-01-31 ENCOUNTER — Other Ambulatory Visit: Payer: Self-pay | Admitting: Family Medicine

## 2021-01-31 DIAGNOSIS — I10 Essential (primary) hypertension: Secondary | ICD-10-CM

## 2021-02-02 NOTE — Telephone Encounter (Signed)
Per note from visit of 11/20/2020 pt is to follow up in 1 year 11/20/2021. Requested Prescriptions  Pending Prescriptions Disp Refills   lisinopril (ZESTRIL) 5 MG tablet [Pharmacy Med Name: Lisinopril 5 MG Oral Tablet] 90 tablet 3    Sig: TAKE 1 TABLET BY MOUTH  DAILY     Cardiovascular:  ACE Inhibitors Failed - 01/31/2021  1:20 PM      Failed - Cr in normal range and within 180 days    Creatinine, Ser  Date Value Ref Range Status  01/11/2021 1.13 (H) 0.57 - 1.00 mg/dL Final         Passed - K in normal range and within 180 days    Potassium  Date Value Ref Range Status  01/11/2021 4.9 3.5 - 5.2 mmol/L Final         Passed - Patient is not pregnant      Passed - Last BP in normal range    BP Readings from Last 1 Encounters:  12/01/20 128/75         Passed - Valid encounter within last 6 months    Recent Outpatient Visits          2 months ago Annual physical exam   [place], DO   10 months ago Benign hypertension with CKD (chronic kidney disease) stage III   [place], DO   1 year ago Annual physical exam   [facility] Olin Hauser, DO   2 years ago Encounter for annual physical exam   [facility] Mikey College, NP   3 years ago Annual physical exam   [place], Devonne Doughty, DO

## 2021-03-15 ENCOUNTER — Other Ambulatory Visit: Payer: Self-pay

## 2021-03-15 ENCOUNTER — Telehealth: Payer: Self-pay | Admitting: Family Medicine

## 2021-03-15 ENCOUNTER — Ambulatory Visit (INDEPENDENT_AMBULATORY_CARE_PROVIDER_SITE_OTHER): Payer: Managed Care, Other (non HMO)

## 2021-03-15 VITALS — Wt 235.0 lb

## 2021-03-15 DIAGNOSIS — Z23 Encounter for immunization: Secondary | ICD-10-CM | POA: Diagnosis not present

## 2021-03-15 NOTE — Telephone Encounter (Signed)
Pt is calling ask if she is able to get her shingles booster and is it in stock? CB- [phone]

## 2021-03-15 NOTE — Telephone Encounter (Signed)
She is coming today for her second inj of Shingrix.

## 2021-04-02 ENCOUNTER — Encounter: Payer: Self-pay | Admitting: Family Medicine

## 2021-04-02 ENCOUNTER — Ambulatory Visit: Payer: Managed Care, Other (non HMO) | Admitting: Family Medicine

## 2021-04-02 ENCOUNTER — Other Ambulatory Visit: Payer: Self-pay

## 2021-04-02 VITALS — BP 124/79 | HR 72 | Ht 61.0 in | Wt 238.0 lb

## 2021-04-02 DIAGNOSIS — M1711 Unilateral primary osteoarthritis, right knee: Secondary | ICD-10-CM

## 2021-04-02 DIAGNOSIS — M25561 Pain in right knee: Secondary | ICD-10-CM | POA: Diagnosis not present

## 2021-04-02 MED ORDER — LIDOCAINE HCL (PF) 1 % IJ SOLN
4.0000 mL | Freq: Once | INTRAMUSCULAR | Status: AC
  Administered 2021-04-02: 4 mL

## 2021-04-02 MED ORDER — METHYLPREDNISOLONE ACETATE 40 MG/ML IJ SUSP
40.0000 mg | Freq: Once | INTRAMUSCULAR | Status: AC
  Administered 2021-04-02: 40 mg via INTRA_ARTICULAR

## 2021-04-02 NOTE — Progress Notes (Signed)
? ?Subjective:  ? ? Patient ID: Rachel Townsend, female    DOB: [birth year], 61 y.o.   MRN: [id number] ? ?Rachel Townsend is a 61 y.o. female presenting on 04/02/2021 for Knee Pain ? ? ?HPI ? ?Right Knee acute on chronic pain ?Reports symptoms started R lateral aspect of knee, 1 week ago. She has had history of baker's cyst behind R knee in past. Feels swollen and full again. She did move or twist the wrong way and it hurt her. ?She has had cortisone injection in Left knee before ([facility], 3 years ago) ?No prior procedure or intervention on R knee ?Has bilateral osteoarthritis DJD Knees, typically has pain in Left knee more. ?Currently taking Gabapentin and Arthritis Str Tylenol. ? ? ?Depression screen [facility] 2/9 11/20/2020 03/20/2020 11/18/2019  ?Decreased Interest 0 1 1  ?Down, Depressed, Hopeless 0 1 1  ?PHQ - 2 Score 0 2 2  ?Altered sleeping 1 1 2   ?Tired, decreased energy 1 1 1   ?Change in appetite 0 1 0  ?Feeling bad or failure about yourself  0 0 1  ?Trouble concentrating 0 0 0  ?Moving slowly or fidgety/restless 0 0 0  ?Suicidal thoughts 0 0 0  ?PHQ-9 Score 2 5 6   ?Difficult doing work/chores Not difficult at all Not difficult at all Not difficult at all  ?Some recent data might be hidden  ? ? ?Social History  ? ?Tobacco Use  ? Smoking status: Former  ?  Packs/day: 1.00  ?  Years: 10.00  ?  Pack years: 10.00  ?  Types: Cigarettes  ?  Quit date: 11/23/1984  ?  Years since quitting: 36.3  ? Smokeless tobacco: Former  ?Vaping Use  ? Vaping Use: Never used  ?Substance Use Topics  ? Alcohol use: Yes  ?  Alcohol/week: 10.0 standard drinks  ?  Types: 10 Glasses of wine per week  ?  Comment: weekly  ? Drug use: No  ? ? ?Review of Systems ?Per HPI unless specifically indicated above ? ?   ?Objective:  ?  ?BP 124/79   Pulse 72   Ht 5\' 1"  (1.549 m)   Wt 238 lb (108 kg)   SpO2 100%   BMI 44.97 kg/m?   ?Wt Readings from Last 3 Encounters:  ?04/02/21 238 lb (108 kg)  ?03/15/21 235 lb (106.6 kg)  ?12/01/20 235 lb (106.6 kg)   ?  ?Physical Exam ?Vitals and nursing note reviewed.  ?Constitutional:   ?   General: She is not in acute distress. ?   Appearance: Normal appearance. She is well-developed. She is obese. She is not diaphoretic.  ?   Comments: Well-appearing, comfortable, cooperative  ?HENT:  ?   Head: Normocephalic and atraumatic.  ?Eyes:  ?   General:     ?   Right eye: No discharge.     ?   Left eye: No discharge.  ?   Conjunctiva/sclera: Conjunctivae normal.  ?Cardiovascular:  ?   Rate and Rhythm: Normal rate.  ?Pulmonary:  ?   Effort: Pulmonary effort is normal.  ?Musculoskeletal:  ?   Comments: Limited ROM flex and extend Right knee with bulky appearance some soft tissue edema anterior medial and posterior. No bruising or erythema  ?Skin: ?   General: Skin is warm and dry.  ?   Findings: No erythema or rash.  ?Neurological:  ?   Mental Status: She is alert and oriented to person, place, and time.  ?Psychiatric:     ?  Mood and Affect: Mood normal.     ?   Behavior: Behavior normal.     ?   Thought Content: Thought content normal.  ?   Comments: Well groomed, good eye contact, normal speech and thoughts  ? ? ? ?________________________________________________________ ?PROCEDURE NOTE ?Date: 04/02/21 ?Right Knee Cortisone injection ?Discussed benefits and risks (including pain, bleeding, infection, steroid flare). ?Verbal consent given by patient. ?Medication:  1 cc Depo-medrol 40mg  and 4 cc Lidocaine 1% without epi ?Time Out taken  ?Landmarks identified. Area cleansed with alcohol wipes. Using 21 gauge and 1, 1/2 inch needle, Right knee  space was injected (with above listed medication) via lateral approach cold spray used for superficial anesthetic. Sterile bandage placed. Patient tolerated procedure well without bleeding or paresthesias. No complications. ? ? ? ?Results for orders placed or performed during the hospital encounter of 11/26/20  ?Basic metabolic panel  ?Result Value Ref Range  ? Sodium 137 135 - 145 mmol/L  ?  Potassium 3.9 3.5 - 5.1 mmol/L  ? Chloride 103 98 - 111 mmol/L  ? CO2 25 22 - 32 mmol/L  ? Glucose, Bld 95 70 - 99 mg/dL  ? BUN 17 6 - 20 mg/dL  ? Creatinine, Ser 1.31 (H) 0.44 - 1.00 mg/dL  ? Calcium 9.2 8.9 - 10.3 mg/dL  ? GFR, Estimated 47 (L) >60 mL/min  ? Anion gap 9 5 - 15  ? ?   ?Assessment & Plan:  ? ?Problem List Items Addressed This Visit   ?None ?Visit Diagnoses   ? ? Acute pain of right knee    -  Primary  ? Relevant Medications  ? methylPREDNISolone acetate (DEPO-MEDROL) injection 40 mg (Completed)  ? lidocaine (PF) (XYLOCAINE) 1 % injection 4 mL (Completed)  ? Primary osteoarthritis of right knee      ? Relevant Medications  ? methylPREDNISolone acetate (DEPO-MEDROL) injection 40 mg (Completed)  ? lidocaine (PF) (XYLOCAINE) 1 % injection 4 mL (Completed)  ? ?  ?  ?Acute on chronic R lateral Knee pain and swelling without known injury or trauma ?Possible twisting injury recently, non impact ?Underlying advanced OA/DJD ?Question meniscus, no locking ?- No prior history of knee surgery, arthroscopy ?Not responding to conservative therapy ? ?Plan: ?1. R knee cortisone injection today, tolerated well ?2. Continue current therapy otherwise, may use topical voltaren and Tylenol, RICE therapy, brace/sleeve ?Defer X-rays ?Future reconsider ortho - note has already been advised in past that she would need joint replacement. ? ? ? ?Meds ordered this encounter  ?Medications  ? methylPREDNISolone acetate (DEPO-MEDROL) injection 40 mg  ? lidocaine (PF) (XYLOCAINE) 1 % injection 4 mL  ? ? ? ? ?Follow up plan: ?Return in about 4 weeks (around 04/30/2021), or if symptoms worsen or fail to improve. ? ?[name], DO ?[facility] ?04/02/2021, 8:44 AM ?

## 2021-04-02 NOTE — Patient Instructions (Addendum)
Thank you for coming to the office today. ? ?You received a RIGHT Knee Joint steroid injection today. ?- Lidocaine numbing medicine may ease the pain initially for a few hours until it wears off ?- As discussed, you may experience a "steroid flare" this evening or within 24-48 hours, anytime medicine is injected into an inflamed joint it can cause the pain to get worse temporarily ?- Everyone responds differently to these injections, it depends on the patient and the severity of the joint problem, it may provide anywhere from days to weeks, to months of relief. Ideal response is >6 months relief ?- Try to take it easy for next 1-2 days, avoid over activity and strain on joint (limit walking for knee) ?- Recommend the following: ?  - For swelling - rest, compression sleeve / ACE wrap, elevation, and ice packs as needed for first few days ?  - For pain in future may use heating pad or moist heat as needed ? ?Medication ?Restart the anti inflammatory as prescribed for 1 week then stop. ? ?If not quite resolving w/ injection at 2 week mark, we can return to Ortho. ? ? ?Please schedule a Follow-up Appointment to: Return in about 4 weeks (around 04/30/2021), or if symptoms worsen or fail to improve. ? ?If you have any other questions or concerns, please feel free to call the office or send a message through [place]. You may also schedule an earlier appointment if necessary. ? ?Additionally, you may be receiving a survey about your experience at our office within a few days to 1 week by e-mail or mail. We value your feedback. ? ?[name], DO ?[facility] ?

## 2021-04-06 ENCOUNTER — Ambulatory Visit: Payer: Self-pay | Admitting: *Deleted

## 2021-04-06 DIAGNOSIS — M1711 Unilateral primary osteoarthritis, right knee: Secondary | ICD-10-CM

## 2021-04-06 DIAGNOSIS — G8929 Other chronic pain: Secondary | ICD-10-CM

## 2021-04-06 NOTE — Telephone Encounter (Signed)
Per agent: ?"Pt was advised by Dr. Raliegh Ip if her right knee issue gets worse she may need to see Ortho/ pt called and stated her knee is worse / referral request was sent by me to the office / please advise if needed " ? ? ?Chief Complaint: knee pain ?Symptoms: pain radiates entire leg , new ?Frequency:  ?Pertinent Negatives: Patient denies redness warmth. ?Disposition: '[]'$ ED /'[]'$ Urgent Care (no appt availability in office) / '[]'$ Appointment(In office/virtual)/ '[]'$  [facility]/ '[]'$ Home Care/ '[]'$ Refused Recommended Disposition /'[]'$ [facility]/ '[x]'$  Follow-up with PCP ?Additional Notes: Pt advised to [facility] if worsens, requesting referral as mentioned at [place] 04/02/21 ?Reason for Disposition ? Knee pain is a chronic symptom (recurrent or ongoing AND present > 4 weeks) ? ?Answer Assessment - Initial Assessment Questions ?1. LOCATION and RADIATION: "Where is the pain located?"  ?    Right knee ?2. QUALITY: "What does the pain feel like?"  (e.g., sharp, dull, aching, burning) ?     ?3. SEVERITY: "How bad is the pain?" "What does it keep you from doing?"   (Scale 1-10; or mild, moderate, severe) ?  -  MILD (1-3): doesn't interfere with normal activities  ?  -  MODERATE (4-7): interferes with normal activities (e.g., work or school) or awakens from sleep, limping  ?  -  SEVERE (8-10): excruciating pain, unable to do any normal activities, unable to walk ?    3/10 at rest, with walking 11/10 ?4. ONSET: "When did the pain start?" "Does it come and go, or is it there all the time?" ?    *No Answer* ?5. RECURRENT: "Have you had this pain before?" If Yes, ask: "When, and what happened then?" ?    *No Answer* ?6. SETTING: "Has there been any recent work, exercise or other activity that involved that part of the body?"  ?    *No Answer* ?7. AGGRAVATING FACTORS: "What makes the knee pain worse?" (e.g., walking, climbing stairs, running) ?    *No Answer* ?8. ASSOCIATED SYMPTOMS: "Is there any swelling or redness of the knee?" ?     *No Answer* ?9. OTHER SYMPTOMS: "Do you have any other symptoms?" (e.g., chest pain, difficulty breathing, fever, calf pain) ?Radiates entire leg. ? ?Protocols used: Knee Pain-A-AH ? ?

## 2021-04-06 NOTE — Addendum Note (Signed)
Addended by: Olin Hauser on: 04/06/2021 04:50 PM ? ? Modules accepted: Orders ? ?

## 2021-04-06 NOTE — Telephone Encounter (Signed)
Referral sent to [facility] for Right Knee pain / arthritis ? ?[name], DO ?[facility] ?[facility] ?04/06/2021, 4:50 PM ? ?

## 2021-04-06 NOTE — Telephone Encounter (Signed)
Copied from [place] [phone]. Topic: Referral - Request for Referral ?>> Apr 06, 2021  8:40 AM Alanda Slim E wrote: ?Has patient seen PCP for this complaint? Yes ? ?*If NO, is insurance requiring patient see PCP for this issue before PCP can refer them? ?Referral for which specialty: Ortho  ?Preferred provider/office:  ?Reason for referral: Right knee issue is worsening / please advise asap ?

## 2021-04-19 ENCOUNTER — Telehealth: Payer: Self-pay | Admitting: Family Medicine

## 2021-04-19 NOTE — Telephone Encounter (Signed)
Could you please clarify more of what patient is requesting? ? ?I saw her recently for knee pain, she told me at that time that in the past she was told she would eventually need a knee replacement. ? ?We did a steroid injection, she has tried other therapy, seems like limited results. ? ?Referral was sent to [facility] for more management. ? ?And it sounds like they have diagnosed her with advanced osteoarthritis with cartilage loss. ? ?I am not sure what the advantage of a 2nd opinion is unless she prefers to go to a different office. ? ?Only other one in town would be [facility]. ? ?[name], DO ?[facility] ?[facility] ?04/19/2021, 12:35 PM ? ?

## 2021-04-19 NOTE — Telephone Encounter (Signed)
Pt went to [facility] for her knee pain..   After xrays at [facility], they advised her the knee does not have cartilage between knee cap and bone.and her knee is "just going to hurt"  Pt is still having pain in her knee and she would like to know if she needs a referral for a second opinion?  What does Dr Raliegh Ip think?  Because her knee continues to hurt and almost feels like now she has swelling behind the knee. ?Please advise, b/c she would like  2nd opinion. ?

## 2021-04-20 NOTE — Telephone Encounter (Signed)
She can certainly get a second opinion with Dr. Raliegh Ip possibly but he is out of the office the rest of the week. She can schedule an appt next week to discuss her concerns.  ?

## 2021-04-20 NOTE — Telephone Encounter (Signed)
Patient called in to inform Dr Raliegh Ip that she saw  [facility] and their recommendation was not satisfactory and she would like something done for her knee  so she would like to see someone else for a second opinion. Asking for a call back today please at Ph# [phone] ?

## 2021-05-03 ENCOUNTER — Ambulatory Visit: Payer: Managed Care, Other (non HMO) | Admitting: Family Medicine

## 2021-05-26 ENCOUNTER — Ambulatory Visit: Payer: Managed Care, Other (non HMO) | Attending: Sports Medicine

## 2021-05-26 DIAGNOSIS — M25561 Pain in right knee: Secondary | ICD-10-CM | POA: Insufficient documentation

## 2021-05-26 DIAGNOSIS — G8929 Other chronic pain: Secondary | ICD-10-CM | POA: Diagnosis present

## 2021-05-26 DIAGNOSIS — R262 Difficulty in walking, not elsewhere classified: Secondary | ICD-10-CM | POA: Diagnosis present

## 2021-05-26 NOTE — Therapy (Signed)
[facility] ?[address] ?[place], [place], [address] ?Phone: [phone]   Fax:  [phone] ? ?Physical Therapy Evaluation ? ?Patient Details  ?Name: Rachel Townsend ?MRN: [id number] ?Date of Birth: [birth year] ?Referring Provider (PT): Diamond Nickel, DO ? ? ?Encounter Date: 05/26/2021 ? ? PT End of Session - 05/26/21 1442   ? ? Visit Number 1   ? Number of Visits 17   ? Date for PT Re-Evaluation 07/22/21   ? Authorization Type 1   ? Authorization Time Period 10   ? PT Start Time 1443   ? PT Stop Time 1941   ? PT Time Calculation (min) 62 min   ? Activity Tolerance Patient tolerated treatment well   ? Behavior During Therapy [facility] for tasks assessed/performed   ? ?  ?  ? ?  ? ? ?Past Medical History:  ?Diagnosis Date  ? Allergy   ? Family history of adverse reaction to anesthesia   ? sister- PONV  ? Fibromyalgia   ? GERD (gastroesophageal reflux disease)   ? Hypertension   ? Pre-diabetes   ? Sleep apnea   ? uses CPAP  ? Stage III chronic kidney disease ([place])   ? Wrist pain   ? ? ?Past Surgical History:  ?Procedure Laterality Date  ? BIOPSY  02/20/2020  ? Procedure: BIOPSY;  Surgeon: Lin Landsman, MD;  Location: [place];  Service: Endoscopy;;  ? BREAST BIOPSY Left   ? core- neg  ? BREAST BIOPSY Left   ? stereo- neg  ? COLONOSCOPY WITH PROPOFOL N/A 02/20/2020  ? Procedure: COLONOSCOPY WITH PROPOFOL;  Surgeon: Lin Landsman, MD;  Location: [place];  Service: Endoscopy;  Laterality: N/A;  ? ECTOPIC PREGNANCY SURGERY    ? FOOT SURGERY Left   ? HARDWARE REMOVAL Right 12/01/2020  ? Procedure: HARDWARE REMOVAL (WRIST);  Surgeon: Earnestine Leys, MD;  Location: [facility];  Service: Orthopedics;  Laterality: Right;  ? ORIF RADIAL FRACTURE Right 02/18/2019  ? Procedure: OPEN REDUCTION INTERNAL FIXATION (ORIF) RADIAL FRACTURE;  Surgeon: Earnestine Leys, MD;  Location: [facility];  Service: Orthopedics;  Laterality: Right;  ?  POLYPECTOMY  02/20/2020  ? Procedure: POLYPECTOMY;  Surgeon: Lin Landsman, MD;  Location: [place];  Service: Endoscopy;;  ? WISDOM TOOTH EXTRACTION    ? ? ?There were no vitals filed for this visit. ? ? ? Subjective Assessment - 05/26/21 1450   ? ? Subjective R knee: 2-3/10 currently, 10/10 at worst for the past 3 months.   ? Pertinent History Chronic R knee pain.  Had no cartilage under both knee caps. Has worked on her feet all her life. Knees have bothered her off and on. Does not genu flect at church anymore. Got a cortisone shot L knee years ago and is fine. R knee bothered her when she rolled over in bed a couple of months ago when her knee cap clicked which started bothered her. Clicking of her knee cap sideways bothers her. Feels tightness behind her R knee. R knee does not extend all the way. Trying to not get a TKA. R knee is better than it was after the recent knee injection but the ROM is still limited. Also has fibromyalgia and arthritis all over the place.   ? Patient Stated Goals Be able to go to the zoo and walk the zoo.   ? Currently in Pain? Yes   ? Pain Score 3    ?  Pain Location Knee   ? Pain Orientation Right   ? Pain Descriptors / Indicators Tightness;Throbbing;Sharp   pressure  ? Pain Type Acute pain;Chronic pain   ? Pain Onset More than a month ago   ? Pain Frequency Occasional   ? Aggravating Factors  turning around/pivoting, opening a heavy door, stepping off a curb, bending her R knee. Going up steps with R LE. Walking around the grocery store. end range R leg extension.   ? Pain Relieving Factors rest, non-weight bearing with slight bend in R knee.   ? ?  ?  ? ?  ? ? ? ? ? [facility] PT Assessment - 05/26/21 1444   ? ?  ? Assessment  ? Medical Diagnosis Chronic knee pain, Right   ? Referring Provider (PT) Diamond Nickel, DO   ? Onset Date/Surgical Date 05/12/21   Date PT referral signed  ?  ? Precautions  ? Precaution Comments No known precautions   ?  ? Restrictions  ?  Other Position/Activity Restrictions No known restrictions.   ?  ? Balance Screen  ? Has the patient fallen in the past 6 months No   ? Has the patient had a decrease in activity level because of a fear of falling?  No   ? Is the patient reluctant to leave their home because of a fear of falling?  No   ?  ? Observation/Other Assessments  ? Focus on Therapeutic Outcomes (FOTO)  R knee FOTO 47   ?  ? Posture/Postural Control  ? Posture Comments B foot pronation L > R. R knee genu valgus, R lumbar lateral shift, decreased lordosis   ?  ? AROM  ? Right Knee Extension -16   with joint line pain  ? Right Knee Flexion 115   with posterior knee tightness.  ?  ? Strength  ? Right Hip Flexion 4-/5   ? Right Hip Extension 4-/5   ? Right Hip External Rotation  4/5   ? Right Hip ABduction 4/5   with R sciatic pain  ? Left Hip Flexion 4/5   ? Left Hip Extension 4-/5   ? Left Hip External Rotation 4/5   ? Left Hip ABduction 4/5   ? Right Knee Flexion 4+/5   ? Right Knee Extension 4+/5   with joint line pain  ? Left Knee Flexion 4+/5   ? Left Knee Extension 5/5   ?  ? Palpation  ? Palpation comment TTP R knee joint line, lateral and inferior patella. Laterally positioned patella L   ?  ? Ambulation/Gait  ? Gait Comments Decreased R knee extension during stance phase.   ? ?  ?  ? ?  ? ? ? ? ? ? ? ? ? ? ? ? ? ?Objective measurements completed on examination: See above findings.  ? ? ? ? ? ? ?No latex allergies ? ?Blood pressure L arm sitting, mechanically taken, normal cuff: 141/78, HR 70  ? ?TTP R knee joint line, lateral and inferior patella. Laterally positioned patella L ? ? ?Manual therapy  ?Seated gentle R knee distraction to decrease pressure  ? ?Seated STM R lateral hamstrings to decrease tension  ? ?Seated STM R vastus lateralis to decrease tension  ? ?Seated STM R lateral knee to decrease tension  ? ? ?Therapeutic exercise  ?Seated hip adduction isometrics folded pillow squeeze 10x2 with 5 second holds  ? ?Improved exercise  technique, movement at target joints, use of target muscles after  mod verbal, visual, tactile cues.  ? ? ?Response to treatment ?Pt tolerated session well without aggravation of symptoms.  ? ? ?Clinical impression ?Pt is a 62 year old female who came to physical therapy secondary to chronic R knee pain. She also presents with altered gait pattern and posture, limited R knee extension and flexion AROM, decreased bilateral hip strength, TTP R knee joint line, and lateral and inferior patella; slight laterally positioned patella, and difficulty with weight bearing tasks such as gait and stair negotiation secondary to pain. Pt will benefit from skilled physical therapy services to address the aforementioned deficits.  ? ? ? ? ?Access Code: [email or web address] ?URL: [email or web address] ?Date: 05/26/2021 ?Prepared by: Joneen Boers ? ?Exercises ?- Seated Hip Adduction Isometrics with Ball  - 1 x daily - 7 x weekly - 3 sets - 10 reps - 5 seconds hold ? ? ? ? ? ? ? ? ? ? ? ? ? ? ? ? ? ? ? ? ? ? PT Education - 05/26/21 1835   ? ? Education Details ther-ex, HEP, POC   ? Person(s) Educated Patient   ? Methods Explanation;Demonstration;Tactile cues;Verbal cues;Handout   ? Comprehension Verbalized understanding;Returned demonstration   ? ?  ?  ? ?  ? ? ? PT Short Term Goals - 05/26/21 1828   ? ?  ? PT SHORT TERM GOAL #1  ? Title Pt will be independent with her initial HEP to decrease pain, improve strength and function.   ? Baseline Pt has started her HEP (05/26/2021)   ? Time 3   ? Period Weeks   ? Status New   ? Target Date 06/17/21   ? ?  ?  ? ?  ? ? ? ? PT Long Term Goals - 05/26/21 1829   ? ?  ? PT LONG TERM GOAL #1  ? Title Pt will have a decrease in R knee pain to 5/10 or less at worst to promote ability to ambulate, negotiate stairs, turn, open a door more comfortably.   ? Baseline 10/10 R knee pain at worst for the past 3 months (05/26/2021)   ? Time 8   ? Period Weeks   ? Status New   ? Target Date 07/22/21   ?  ? PT  LONG TERM GOAL #2  ? Title Pt will improve R knee extension AROM to -5 degrees or more to promote ability to ambulate more comfortably.   ? Baseline Seated R knee extension AROM -16 degrees with joint line

## 2021-05-31 ENCOUNTER — Ambulatory Visit: Payer: Managed Care, Other (non HMO)

## 2021-06-03 ENCOUNTER — Ambulatory Visit: Payer: Managed Care, Other (non HMO) | Attending: Sports Medicine

## 2021-06-03 DIAGNOSIS — R262 Difficulty in walking, not elsewhere classified: Secondary | ICD-10-CM | POA: Insufficient documentation

## 2021-06-03 DIAGNOSIS — G8929 Other chronic pain: Secondary | ICD-10-CM | POA: Diagnosis present

## 2021-06-03 DIAGNOSIS — M25561 Pain in right knee: Secondary | ICD-10-CM | POA: Insufficient documentation

## 2021-06-03 NOTE — Therapy (Signed)
?OUTPATIENT PHYSICAL THERAPY TREATMENT NOTE ? ? ?Patient Name: Rachel Townsend ?MRN: [id number] ?DOB:April 30, 1960, 61 y.o., female ?Today's Date: 06/03/2021 ? ?PCP: [name], DO ?REFERRING PROVIDER: Diamond Nickel, DO ? ? PT End of Session - 06/03/21 1546   ? ? Visit Number 2   ? Number of Visits 17   ? Date for PT Re-Evaluation 07/22/21   ? Authorization Type 2   ? Authorization Time Period 10   ? PT Start Time 1546   ? PT Stop Time 1630   ? PT Time Calculation (min) 44 min   ? Activity Tolerance Patient tolerated treatment well   ? Behavior During Therapy [facility] for tasks assessed/performed   ? ?  ?  ? ?  ? ? ?Past Medical History:  ?Diagnosis Date  ? Allergy   ? Family history of adverse reaction to anesthesia   ? sister- PONV  ? Fibromyalgia   ? GERD (gastroesophageal reflux disease)   ? Hypertension   ? Pre-diabetes   ? Sleep apnea   ? uses CPAP  ? Stage III chronic kidney disease ([place])   ? Wrist pain   ? ?Past Surgical History:  ?Procedure Laterality Date  ? BIOPSY  02/20/2020  ? Procedure: BIOPSY;  Surgeon: Lin Landsman, MD;  Location: [place];  Service: Endoscopy;;  ? BREAST BIOPSY Left   ? core- neg  ? BREAST BIOPSY Left   ? stereo- neg  ? COLONOSCOPY WITH PROPOFOL N/A 02/20/2020  ? Procedure: COLONOSCOPY WITH PROPOFOL;  Surgeon: Lin Landsman, MD;  Location: [place];  Service: Endoscopy;  Laterality: N/A;  ? ECTOPIC PREGNANCY SURGERY    ? FOOT SURGERY Left   ? HARDWARE REMOVAL Right 12/01/2020  ? Procedure: HARDWARE REMOVAL (WRIST);  Surgeon: Earnestine Leys, MD;  Location: [facility];  Service: Orthopedics;  Laterality: Right;  ? ORIF RADIAL FRACTURE Right 02/18/2019  ? Procedure: OPEN REDUCTION INTERNAL FIXATION (ORIF) RADIAL FRACTURE;  Surgeon: Earnestine Leys, MD;  Location: [facility];  Service: Orthopedics;  Laterality: Right;  ? POLYPECTOMY  02/20/2020  ? Procedure: POLYPECTOMY;  Surgeon: Lin Landsman, MD;  Location: [place];  Service:  Endoscopy;;  ? WISDOM TOOTH EXTRACTION    ? ?Patient Active Problem List  ? Diagnosis Date Noted  ? History of colonic polyps   ? Chronic kidney disease (CKD), stage III (moderate) ([place]) 10/30/2018  ? De Quervain's tenosynovitis, right 08/08/2016  ? Hyperlipidemia 05/24/2016  ? OSA on CPAP 05/04/2016  ? Chronic fatigue 08/17/2015  ? Sciatica 05/14/2015  ? Morbid obesity with BMI of 40.0-44.9, adult ([place]) 03/02/2015  ? Fibromyalgia 07/16/2014  ? Major depressive disorder, recurrent, in partial remission ([place]) 07/16/2014  ? Insomnia 07/16/2014  ? Benign hypertension with CKD (chronic kidney disease) stage III 07/16/2014  ? ? ?REFERRING DIAG: Chronic knee pain, Right ? ?THERAPY DIAG:  ?Chronic pain of right knee ? ?Difficulty in walking, not elsewhere classified ? ?PERTINENT HISTORY: Chronic R knee pain. Had no cartilage under both knee caps. Has worked on her feet all her life. Knees have bothered her off and on. Does not genu flect at church anymore. Got a cortisone shot L knee years ago and is fine. R knee bothered her when she rolled over in bed a couple of months ago when her knee cap clicked which started bothered her. Clicking of her knee cap sideways bothers her. Feels tightness behind her R knee. R knee does not extend all the way. Trying to not get a  TKA. R knee is better than it was after the recent knee injection but the ROM is still limited. Also has fibromyalgia and arthritis all over the place. ? ?PRECAUTIONS: No known precautions ? ?SUBJECTIVE: R knee is ok. It reminds her that its there.  ? ?PAIN:  ?Are you having pain? Yes: NPRS scale: 1/10  R knee.  ? ? ? ? ?TODAY'S TREATMENT:  ?  ?Objective measurements completed on examination: See above findings.  ?  ?  ?  ?   ?No latex allergies ?  ?Blood pressure L arm sitting, mechanically taken, normal cuff: 141/78, HR 70  ?  ?TTP R knee joint line, lateral and inferior patella. Laterally positioned patella L ?  ?  ?Manual therapy ?Supine medial glide R  patella  ? ?Medial patellar taping  ? ?Supine medial rotation R leg grade 1 for pain control  ? Sciatic nerve symptoms but decreased pressure posterior knee.  ? ?  ?Therapeutic exercise  ?Supine quad set 10x5 seconds (slight relined position  ? Posterior knee pressure  ? ?S/L hip abduction  ? R 10x2 ? ? ?Seated hip ER  ? R 10x ? ? ?Seated hip adduction isometrics folded pillow squeeze 10x2 with 5 second holds  ? ?Seated hip abduction clamshell, hips less than 90 degrees flexion  ? Red 10x5 secodns (easy) ? Blue 10x5 seconds  ? ?  ?Improved exercise technique, movement at target joints, use of target muscles after mod verbal, visual, tactile cues.  ?  ?  ?Response to treatment ?Pt tolerated session well without aggravation of symptoms.  ?  ?  ?Clinical impression ?Worked on improving medial glide R patella, and improving glute med strength to promote femoral control, decrease genu valgus and improve R knee mechanics. Pt tolerated session well without aggravation of symptoms. Pt will benefit from continued skilled physical therapy services to decrease pain, improve strength and function.  ? ? ?  ?  ?   ?  ? ? ?PATIENT EDUCATION: ?Education details: ther-ex ?Person educated: Patient ?Education method: Explanation, Demonstration, Tactile cues, and Verbal cues ?Education comprehension: verbalized understanding and returned demonstration ? ? ?HOME EXERCISE PROGRAM: ?Access Code: [email or web address] ?URL: [email or web address] ?Date: 05/26/2021 ?Prepared by: Joneen Boers ?  ?Exercises ?- Seated Hip Adduction Isometrics with Ball  - 1 x daily - 7 x weekly - 3 sets - 10 reps - 5 seconds hold ? - Sidelying Hip Abduction  - 1 x daily - 7 x weekly - 2 sets - 10 reps ?- Seated Hip External Rotation AROM  - 1 x daily - 7 x weekly - 1 sets - 10 reps ? ? ? ? ?6 ? PT Short Term Goals - 05/26/21 1828   ? ?  ? PT SHORT TERM GOAL #1  ? Title Pt will be independent with her initial HEP to decrease pain, improve strength and function.    ? Baseline Pt has started her HEP (05/26/2021)   ? Time 3   ? Period Weeks   ? Status New   ? Target Date 06/17/21   ? ?  ?  ? ?  ? ? ? PT Long Term Goals - 05/26/21 1829   ? ?  ? PT LONG TERM GOAL #1  ? Title Pt will have a decrease in R knee pain to 5/10 or less at worst to promote ability to ambulate, negotiate stairs, turn, open a door more comfortably.   ? Baseline 10/10 R knee pain at worst for the past 3  months (05/26/2021)   ? Time 8   ? Period Weeks   ? Status New   ? Target Date 07/22/21   ?  ? PT LONG TERM GOAL #2  ? Title Pt will improve R knee extension AROM to -5 degrees or more to promote ability to ambulate more comfortably.   ? Baseline Seated R knee extension AROM -16 degrees with joint line pain. (05/26/2021)   ? Time 8   ? Period Weeks   ? Status New   ? Target Date 07/22/21   ?  ? PT LONG TERM GOAL #3  ? Title Pt will improve her bilateral hip extension and abduction strength by at least 1/2 MMT grade to promote ability to perform standing tasks more comfortably for her R knee.   ? Baseline Hip extension 4-/5 R and L, hip abduction 4/5 R and L (05/26/2021)   ? Time 8   ? Period Weeks   ? Status New   ? Target Date 07/22/21   ?  ? PT LONG TERM GOAL #4  ? Title Pt will improve her R knee FOTO score by at least 10 points as a demonstration of improved function.   ? Baseline R knee FOTO 47 (05/26/2021)   ? Time 8   ? Period Weeks   ? Status New   ? Target Date 07/22/21   ? ?  ?  ? ?  ? ? ? Plan - 06/03/21 1543   ? ? Clinical Impression Statement Worked on improving medial glide R patella, and improving glute med strength to promote femoral control, decrease genu valgus and improve R knee mechanics. Pt tolerated session well without aggravation of symptoms. Pt will benefit from continued skilled physical therapy services to decrease pain, improve strength and function.   ? Personal Factors and Comorbidities Comorbidity 2;Age;Past/Current Experience;Time since onset of injury/illness/exacerbation;Fitness    ? Comorbidities Fibromyalgia, HTN   ? Examination-Activity Limitations Stairs;Stand;Transfers;Squat;Locomotion Level;Carry;Lift   ? Stability/Clinical Decision Making --   knee pain seems to be worsening based

## 2021-06-08 ENCOUNTER — Ambulatory Visit: Payer: Managed Care, Other (non HMO)

## 2021-09-05 ENCOUNTER — Other Ambulatory Visit: Payer: Self-pay | Admitting: Family Medicine

## 2021-09-05 DIAGNOSIS — F5105 Insomnia due to other mental disorder: Secondary | ICD-10-CM

## 2021-09-06 NOTE — Telephone Encounter (Signed)
Requested Prescriptions  Pending Prescriptions Disp Refills  . sertraline (ZOLOFT) 100 MG tablet [Pharmacy Med Name: Sertraline HCl 100 MG Oral Tablet] 90 tablet 3    Sig: TAKE 1 TABLET BY MOUTH DAILY     Psychiatry:  Antidepressants - SSRI - sertraline Passed - 09/05/2021  5:15 AM      Passed - AST in normal range and within 360 days    AST  Date Value Ref Range Status  01/11/2021 22 0 - 40 IU/L Final         Passed - ALT in normal range and within 360 days    ALT  Date Value Ref Range Status  01/11/2021 32 0 - 32 IU/L Final         Passed - Completed PHQ-2 or PHQ-9 in the last 360 days      Passed - Valid encounter within last 6 months    Recent Outpatient Visits          5 months ago Acute pain of right knee   [place], DO   9 months ago Annual physical exam   [place], DO   1 year ago Benign hypertension with CKD (chronic kidney disease) stage III   [place], DO   1 year ago Annual physical exam   [facility] Olin Hauser, DO   2 years ago Encounter for annual physical exam   [facility] Merrilyn Puma, Jerrel Ivory, NP

## 2021-12-09 ENCOUNTER — Other Ambulatory Visit: Payer: Self-pay | Admitting: Family Medicine

## 2021-12-09 DIAGNOSIS — E782 Mixed hyperlipidemia: Secondary | ICD-10-CM

## 2021-12-09 NOTE — Telephone Encounter (Signed)
Last RF 01/10/21 #90 3 RF  Requested Prescriptions  Refused Prescriptions Disp Refills   atorvastatin (LIPITOR) 20 MG tablet [Pharmacy Med Name: Atorvastatin Calcium 20 MG Oral Tablet] 90 tablet 3    Sig: TAKE 1 TABLET BY MOUTH ONCE  DAILY     Cardiovascular:  Antilipid - Statins Failed - 12/09/2021  5:06 AM      Failed - Lipid Panel in normal range within the last 12 months    Cholesterol, Total  Date Value Ref Range Status  01/11/2021 213 (H) 100 - 199 mg/dL Final   LDL Chol Calc (NIH)  Date Value Ref Range Status  01/11/2021 132 (H) 0 - 99 mg/dL Final   HDL  Date Value Ref Range Status  01/11/2021 55 >39 mg/dL Final   Triglycerides  Date Value Ref Range Status  01/11/2021 146 0 - 149 mg/dL Final         Passed - Patient is not pregnant      Passed - Valid encounter within last 12 months    Recent Outpatient Visits           8 months ago Acute pain of right knee   [place], DO   1 year ago Annual physical exam   [facility] Olin Hauser, DO   1 year ago Benign hypertension with CKD (chronic kidney disease) stage III   [place], Devonne Doughty, DO   2 years ago Annual physical exam   [facility] Olin Hauser, DO   3 years ago Encounter for annual physical exam   [facility] Merrilyn Puma, Jerrel Ivory, NP       Future Appointments             In 1 month Parks Ranger, Devonne Doughty, [facility], [facility]

## 2022-01-01 ENCOUNTER — Other Ambulatory Visit: Payer: Self-pay | Admitting: Family Medicine

## 2022-01-01 DIAGNOSIS — E782 Mixed hyperlipidemia: Secondary | ICD-10-CM

## 2022-01-03 ENCOUNTER — Ambulatory Visit: Payer: Self-pay | Admitting: *Deleted

## 2022-01-03 ENCOUNTER — Encounter: Payer: Self-pay | Admitting: Family Medicine

## 2022-01-03 ENCOUNTER — Ambulatory Visit: Payer: Managed Care, Other (non HMO) | Admitting: Family Medicine

## 2022-01-03 VITALS — Ht 61.0 in | Wt 238.0 lb

## 2022-01-03 DIAGNOSIS — U071 COVID-19: Secondary | ICD-10-CM

## 2022-01-03 MED ORDER — NIRMATRELVIR/RITONAVIR (PAXLOVID) TABLET (RENAL DOSING): 2.0000 | ORAL_TABLET | Freq: Two times a day (BID) | ORAL | 0 refills | Status: AC

## 2022-01-03 NOTE — Progress Notes (Signed)
Virtual Visit via Telephone The purpose of this virtual visit is to provide medical care while limiting exposure to the novel coronavirus (COVID19) for both patient and office staff.  Consent was obtained for phone visit:  Yes.   Answered questions that patient had about telehealth interaction:  Yes.   I discussed the limitations, risks, security and privacy concerns of performing an evaluation and management service by telephone. I also discussed with the patient that there may be a patient responsible charge related to this service. The patient expressed understanding and agreed to proceed.  Patient Location: Home Provider Location: [facility] (Office)  Participants in virtual visit: - Patient: Rachel Townsend - CMA: Orinda Kenner, [place] - Provider: Dr Parks Ranger  ---------------------------------------------------------------------- Chief Complaint  Patient presents with   Covid Positive    S: Reviewed CMA documentation. I have called patient and gathered additional HPI as follows:  COVID-19 Infection Reports that symptoms started 1 day ago with feeling fatigued and tired, chills and felt cold, sinus congestion drainage. Not having coughing shortness of breath wheezing. She took OTC COVID Test at home and it was POSITIVE today 01/03/22 - Tried OTC Tylenol AS NEEDED Last COVID vaccine bivalent omicron 2022  Denies any known or suspected exposure to person with or possibly with COVID19.  Denies any fevers, sweats, cough, shortness of breath, abdominal pain, diarrhea  Past Medical History:  Diagnosis Date   Allergy    Family history of adverse reaction to anesthesia    sister- PONV   Fibromyalgia    GERD (gastroesophageal reflux disease)    Hypertension    Pre-diabetes    Sleep apnea    uses CPAP   Stage III chronic kidney disease (HCC)    Wrist pain    Social History   Tobacco Use   Smoking status: Former    Packs/day: 1.00    Years: 10.00     Total pack years: 10.00    Types: Cigarettes    Quit date: 11/23/1984    Years since quitting: 37.1   Smokeless tobacco: Former  Scientific laboratory technician Use: Never used  Substance Use Topics   Alcohol use: Yes    Alcohol/week: 10.0 standard drinks of alcohol    Types: 10 Glasses of wine per week    Comment: weekly   Drug use: No    Current Outpatient Medications:    acetaminophen (TYLENOL) 650 MG CR tablet, Take 1,300 mg by mouth 2 (two) times daily as needed for pain., Disp: , Rfl:    Alum Hydroxide-Mag Carbonate (GAVISCON EXTRA STRENGTH) 160-105 MG CHEW, Chew 2 tablets by mouth daily as needed (heartburn)., Disp: , Rfl:    Artificial Saliva (DRY MOUTH DROPS) LOZG, Use as directed 1 lozenge in the mouth or throat daily as needed (dry mouth)., Disp: , Rfl:    atorvastatin (LIPITOR) 20 MG tablet, TAKE 1 TABLET BY MOUTH  DAILY, Disp: 90 tablet, Rfl: 3   buPROPion (WELLBUTRIN XL) 300 MG 24 hr tablet, TAKE 1 TABLET BY MOUTH  DAILY, Disp: 90 tablet, Rfl: 3   Cholecalciferol (DIALYVITE VITAMIN D 5000) 125 MCG (5000 UT) capsule, Take 5,000 Units by mouth daily., Disp: , Rfl:    Coenzyme Q10 (COQ10) 100 MG CAPS, Take 100 mg by mouth daily., Disp: , Rfl:    gabapentin (NEURONTIN) 600 MG tablet, TAKE 1 AND 1/2 TABLETS BY  MOUTH TWICE DAILY, Disp: 270 tablet, Rfl: 3   lisinopril (ZESTRIL) 5 MG tablet, TAKE 1 TABLET BY  MOUTH  DAILY, Disp: 90 tablet, Rfl: 3   loratadine (CLARITIN) 10 MG tablet, Take 10 mg by mouth every morning., Disp: , Rfl:    Magnesium Bisglycinate (MAG GLYCINATE PO), Take 2 tablets by mouth 2 (two) times daily., Disp: , Rfl:    nirmatrelvir/ritonavir EUA, renal dosing, (PAXLOVID) 10 x 150 MG & 10 x '100MG'$  TABS, Take 2 tablets by mouth 2 (two) times daily for 5 days. (Take nirmatrelvir 150 mg one tablet twice daily for 5 days and ritonavir 100 mg one tablet twice daily for 5 days) Patient GFR is 55, Disp: 20 tablet, Rfl: 0   sertraline (ZOLOFT) 100 MG tablet, TAKE 1 TABLET BY MOUTH  DAILY, Disp: 90 tablet, Rfl: 3     11/20/2020    2:09 PM 03/20/2020    9:23 AM 11/18/2019    1:43 PM  Depression screen PHQ 2/9  Decreased Interest 0 1 1  Down, Depressed, Hopeless 0 1 1  PHQ - 2 Score 0 2 2  Altered sleeping '1 1 2  '$ Tired, decreased energy '1 1 1  '$ Change in appetite 0 1 0  Feeling bad or failure about yourself  0 0 1  Trouble concentrating 0 0 0  Moving slowly or fidgety/restless 0 0 0  Suicidal thoughts 0 0 0  PHQ-9 Score '2 5 6  '$ Difficult doing work/chores Not difficult at all Not difficult at all Not difficult at all       11/20/2020    2:10 PM 11/18/2019    2:07 PM 05/22/2017    8:33 AM  GAD 7 : Generalized Anxiety Score  Nervous, Anxious, on Edge 0 1 1  Control/stop worrying 0 1 1  Worry too much - different things 0 1 1  Trouble relaxing 0 1 1  Restless 0 0 0  Easily annoyed or irritable 0 0 0  Afraid - awful might happen 0 0 0  Total GAD 7 Score 0 4 4  Anxiety Difficulty Not difficult at all Not difficult at all Not difficult at all    -------------------------------------------------------------------------- O: No physical exam performed due to remote telephone encounter.  Lab results reviewed.  No results found for this or any previous visit (from the past 2160 hour(s)).  -------------------------------------------------------------------------- A&P:  Problem List Items Addressed This Visit   None Visit Diagnoses     COVID-19 virus infection    -  Primary   Relevant Medications   nirmatrelvir/ritonavir EUA, renal dosing, (PAXLOVID) 10 x 150 MG & 10 x '100MG'$  TABS      COVID19 positive Symptom 1st onset 01/02/22 Confirm home test positive 01/03/22 Mild to moderate symptoms currently. No red flags or dyspnea Risk factor age 24+, OSA, HYPERTENSION, Obesity   Start Paxlovid, GFR  < 60 renal dosing, GFR 55, 5 day course as prescribed. Reviewed med interaction checker without interactions Counseling provided Supportive care OTC  PRN Follow-up criteria given.   Meds ordered this encounter  Medications   nirmatrelvir/ritonavir EUA, renal dosing, (PAXLOVID) 10 x 150 MG & 10 x '100MG'$  TABS    Sig: Take 2 tablets by mouth 2 (two) times daily for 5 days. (Take nirmatrelvir 150 mg one tablet twice daily for 5 days and ritonavir 100 mg one tablet twice daily for 5 days) Patient GFR is 55    Dispense:  20 tablet    Refill:  0    Follow-up: - Return in 1 week if not improved  Patient verbalizes understanding with the above medical recommendations including the  limitation of remote medical advice.  Specific follow-up and call-back criteria were given for patient to follow-up or seek medical care more urgently if needed.   - Time spent in direct consultation with patient on phone: 10 minutes   [name], [facility] 01/03/2022, 11:43 AM

## 2022-01-03 NOTE — Patient Instructions (Addendum)
Thank you for coming to the office today.  Start COVID anti viral for 5 days Recommend quarantine isolate for 5 days while on med If fever free and improved can break quarantine after 5 days, and then wear mask for additional 5 days Caution of rebound covid or secondary infection Please follow-up notify us if need  Please schedule a Follow-up Appointment to: Return if symptoms worsen or fail to improve.  If you have any other questions or concerns, please feel free to call the office or send a message through [place]. You may also schedule an earlier appointment if necessary.  Additionally, you may be receiving a survey about your experience at our office within a few days to 1 week by e-mail or mail. We value your feedback.  [name], DO [place]

## 2022-01-03 NOTE — Telephone Encounter (Signed)
Requested medication (s) are due for refill today: yes  Requested medication (s) are on the active medication list: yes  Last refill: 01/10/21 #90/3  Future visit scheduled: had appt today  Notes to clinic:  Unable to refill per protocol due to failed labs, no updated results.      Requested Prescriptions  Pending Prescriptions Disp Refills   atorvastatin (LIPITOR) 20 MG tablet [Pharmacy Med Name: Atorvastatin Calcium 20 MG Oral Tablet] 90 tablet 3    Sig: TAKE 1 TABLET BY MOUTH ONCE  DAILY     Cardiovascular:  Antilipid - Statins Failed - 01/01/2022 11:00 AM      Failed - Lipid Panel in normal range within the last 12 months    Cholesterol, Total  Date Value Ref Range Status  01/11/2021 213 (H) 100 - 199 mg/dL Final   LDL Chol Calc (NIH)  Date Value Ref Range Status  01/11/2021 132 (H) 0 - 99 mg/dL Final   HDL  Date Value Ref Range Status  01/11/2021 55 >39 mg/dL Final   Triglycerides  Date Value Ref Range Status  01/11/2021 146 0 - 149 mg/dL Final         Passed - Patient is not pregnant      Passed - Valid encounter within last 12 months    Recent Outpatient Visits           Today COVID-19 virus infection   [place], DO   9 months ago Acute pain of right knee   [place], DO   1 year ago Annual physical exam   [facility] Olin Hauser, DO   1 year ago Benign hypertension with CKD (chronic kidney disease) stage III   [place], Devonne Doughty, DO   2 years ago Annual physical exam   [facility] Olin Hauser, DO       Future Appointments             In 1 week Parks Ranger, Devonne Doughty, DO [facility], [facility]

## 2022-01-03 NOTE — Telephone Encounter (Signed)
Summary: covid   Pt tested positive for covid this morning/ pt is experiencing chills, body aches, sore throat and headache / symptoms started last night / please advise pt on what to do since having covid         Chief Complaint: positive covid at home test today  Symptoms: headache, body aches, chills. Sore throat. "Feels terrible" Frequency: last night 01/02/22 Pertinent Negatives: Patient denies chest pain no difficulty breathing no fever no cough  Disposition: '[]'$ ED /'[]'$ Urgent Care (no appt availability in office) / '[x]'$ Appointment(In office/virtual)/ '[]'$  [place] [facility]/ '[]'$ Home Care/ '[]'$ Refused Recommended Disposition /'[]'$ [place] [facility]/ '[]'$  Follow-up with PCP Additional Notes:   Appt scheduled for today at 11:20 am. Does patient need to come in for visit for additional testing for flu/RSV? Patient reports she has not done a My Chart VV. Appt is for OV today . Do you want patient to stay in her car until ready for appt time or can she come in office?      Reason for Disposition  [1] HIGH RISK patient (e.g., weak immune system, age > 35 years, obesity with BMI 30 or higher, pregnant, chronic lung disease or other chronic medical condition) AND [2] COVID symptoms (e.g., cough, fever)  (Exceptions: Already seen by PCP and no new or worsening symptoms.)  Answer Assessment - Initial Assessment Questions 1. COVID-19 DIAGNOSIS: "How do you know that you have COVID?" (e.g., positive lab test or self-test, diagnosed by doctor or NP/PA, symptoms after exposure).     At home test this am positive covid  2. COVID-19 EXPOSURE: "Was there any known exposure to COVID before the symptoms began?" CDC Definition of close contact: within 6 feet (2 meters) for a total of 15 minutes or more over a 24-hour period.      na 3. ONSET: "When did the COVID-19 symptoms start?"      Yesterday 01/02/22 4. WORST SYMPTOM: "What is your worst symptom?" (e.g., cough, fever, shortness of breath, muscle  aches)     Headache, body aches, sore throat chills 5. COUGH: "Do you have a cough?" If Yes, ask: "How bad is the cough?"       no 6. FEVER: "Do you have a fever?" If Yes, ask: "What is your temperature, how was it measured, and when did it start?"     no 7. RESPIRATORY STATUS: "Describe your breathing?" (e.g., normal; shortness of breath, wheezing, unable to speak)      Normal  8. BETTER-SAME-WORSE: "Are you getting better, staying the same or getting worse compared to yesterday?"  If getting worse, ask, "In what way?"     Worse today  9. OTHER SYMPTOMS: "Do you have any other symptoms?"  (e.g., chills, fatigue, headache, loss of smell or taste, muscle pain, sore throat)     Headache, body aches, chills. Sore throat  10. HIGH RISK DISEASE: "Do you have any chronic medical problems?" (e.g., asthma, heart or lung disease, weak immune system, obesity, etc.)       na 11. VACCINE: "Have you had the COVID-19 vaccine?" If Yes, ask: "Which one, how many shots, when did you get it?"       na 12. PREGNANCY: "Is there any chance you are pregnant?" "When was your last menstrual period?"       na 13. O2 SATURATION MONITOR:  "Do you use an oxygen saturation monitor (pulse oximeter) at home?" If Yes, ask "What is your reading (oxygen level) today?" "What is your usual oxygen  saturation reading?" (e.g., 95%)       na  Protocols used: Coronavirus (SFSEL-95) Diagnosed or Suspected-A-AH

## 2022-01-07 ENCOUNTER — Other Ambulatory Visit: Payer: Self-pay | Admitting: Family Medicine

## 2022-01-07 DIAGNOSIS — F331 Major depressive disorder, recurrent, moderate: Secondary | ICD-10-CM

## 2022-01-07 NOTE — Telephone Encounter (Signed)
Requested medication (s) are due for refill today:   Yes  Requested medication (s) are on the active medication list:   Yes  Future visit scheduled:   Yes  01/12/2022   Last ordered: 01/10/2021 #90, 3 refills  Returned because labs due.   Provider to review for refills prior to upcoming appt.   Requested Prescriptions  Pending Prescriptions Disp Refills   buPROPion (WELLBUTRIN XL) 300 MG 24 hr tablet [Pharmacy Med Name: BUPROPION XL '300MG'$  TABLETS] 90 tablet 3    Sig: TAKE 1 TABLET BY MOUTH  DAILY     Psychiatry: Antidepressants - bupropion Failed - 01/07/2022 10:12 AM      Failed - Cr in normal range and within 360 days    Creatinine, Ser  Date Value Ref Range Status  01/11/2021 1.13 (H) 0.57 - 1.00 mg/dL Final         Failed - AST in normal range and within 360 days    AST  Date Value Ref Range Status  01/11/2021 22 0 - 40 IU/L Final         Failed - ALT in normal range and within 360 days    ALT  Date Value Ref Range Status  01/11/2021 32 0 - 32 IU/L Final         Failed - Completed PHQ-2 or PHQ-9 in the last 360 days      Passed - Last BP in normal range    BP Readings from Last 1 Encounters:  04/02/21 124/79         Passed - Valid encounter within last 6 months    Recent Outpatient Visits           4 days ago COVID-19 virus infection   [place], DO   9 months ago Acute pain of right knee   [place], DO   1 year ago Annual physical exam   [place], DO   1 year ago Benign hypertension with CKD (chronic kidney disease) stage III   [place], Devonne Doughty, DO   2 years ago Annual physical exam   [place], Devonne Doughty, DO       Future Appointments             In 5 days Parks Ranger, Devonne Doughty, [facility], [facility]

## 2022-01-12 ENCOUNTER — Encounter: Payer: Self-pay | Admitting: Family Medicine

## 2022-01-12 ENCOUNTER — Ambulatory Visit (INDEPENDENT_AMBULATORY_CARE_PROVIDER_SITE_OTHER): Payer: Managed Care, Other (non HMO) | Admitting: Family Medicine

## 2022-01-12 VITALS — BP 134/80 | HR 88 | Ht 61.0 in | Wt 229.0 lb

## 2022-01-12 DIAGNOSIS — Z23 Encounter for immunization: Secondary | ICD-10-CM | POA: Diagnosis not present

## 2022-01-12 DIAGNOSIS — E559 Vitamin D deficiency, unspecified: Secondary | ICD-10-CM | POA: Diagnosis not present

## 2022-01-12 DIAGNOSIS — Z6841 Body Mass Index (BMI) 40.0 and over, adult: Secondary | ICD-10-CM

## 2022-01-12 DIAGNOSIS — F3341 Major depressive disorder, recurrent, in partial remission: Secondary | ICD-10-CM

## 2022-01-12 DIAGNOSIS — Z1231 Encounter for screening mammogram for malignant neoplasm of breast: Secondary | ICD-10-CM

## 2022-01-12 DIAGNOSIS — E538 Deficiency of other specified B group vitamins: Secondary | ICD-10-CM

## 2022-01-12 DIAGNOSIS — Z0000 Encounter for general adult medical examination without abnormal findings: Secondary | ICD-10-CM | POA: Diagnosis not present

## 2022-01-12 DIAGNOSIS — N183 Chronic kidney disease, stage 3 unspecified: Secondary | ICD-10-CM

## 2022-01-12 DIAGNOSIS — M797 Fibromyalgia: Secondary | ICD-10-CM

## 2022-01-12 DIAGNOSIS — I129 Hypertensive chronic kidney disease with stage 1 through stage 4 chronic kidney disease, or unspecified chronic kidney disease: Secondary | ICD-10-CM

## 2022-01-12 DIAGNOSIS — G4733 Obstructive sleep apnea (adult) (pediatric): Secondary | ICD-10-CM

## 2022-01-12 NOTE — Patient Instructions (Addendum)
Thank you for coming to the office today.  Chronic Care Management Team will call you  Noreene Larsson (Nurse case manager) Elliot Gurney (LCSW social worker)  --------------------------------------  Last mammo Feb 2022, you are due anytime.  For Mammogram screening for breast cancer   Call the [place] below anytime to schedule your own appointment now that order has been placed.  [place] [facility] [address] [place] # [place], [place] [address] Phone: [phone]  Patients with severe exocrine pancreatic insufficiency have elevated levels of fecal fat. Fat malabsorption predisposes patients to deficiencies of the fat soluble vitamins A, D, E, and K [10,11]. Rarely, patients may also have vitamin B12 deficiency   Future reconsider return to GI for consultation about Pancreas.  Please schedule a Follow-up Appointment to: Return in about 6 months (around 07/14/2022) for 6 month follow-up HTN, OSA, Mood updates .  If you have any other questions or concerns, please feel free to call the office or send a message through [place]. You may also schedule an earlier appointment if necessary.  Additionally, you may be receiving a survey about your experience at our office within a few days to 1 week by e-mail or mail. We value your feedback.  [name], DO [place]

## 2022-01-12 NOTE — Progress Notes (Addendum)
Subjective:    Patient ID: Rachel Townsend, female    DOB: 07-Nov-1960, 61 y.o.   MRN: [id number]  Rachel Townsend is a 61 y.o. female presenting on 01/12/2022 for Annual Exam   HPI  Here for Annual Physical and Lab Review.   Lifestyle / Morbid Obesity BMI >43  More sedentary Lost 10 lbs in past 1 year. More sedentary overall, mostly caregiver for husband - Goal to improve diet  She has experienced worsening caregiver burden and impacting her mood and fibromyalgia   CHRONIC HTN / CKD-III Reports no concerns, not checking BP at home. Current Meds - Lisinopril '5mg'$    Reports good compliance, took meds today. Tolerating well, w/o complaints.    FOLLOW-UP Insomnia / OSA on CPAP Continued treat with CPAP for OSA Improved sleep with CPAP. She adheres to machine and uses it nightly avg >8 hours nightly - No new concerns or symptoms - Insomnia is improved now   FOLLOW-UP DEPRESSION / FIBROMYALGIA: See prior notes for background information. - Today patient reports no new concerns. Mood remains improved much better - Still has generalized pain from fibromyalgia, but seems improved with better sleep - Continues Sertraline and Wellbutrin - Takes Gabapentin '900mg'$  (1.5 tabs '600mg'$ ) BID No longer taking NSAID Etolodac   Upcoming next week, Orthopedics removal of plate. R wrist.   Recent COVID infection 12/4 treated Paxlovid   Health Maintenance:    Flu Shot today  Last mammogram 11/2018 abnormal, had Ultrasound done 12/2018 Mammogramed updated 03/11/20 - birads 1 negative, repeat when ready, she missed it this year, and plans to call to schedule now.   Last colonoscopy done by Dr Marius Ditch 02/20/20 - had polyps with tubular adenoma and hyperplastic, repeat colonoscopy in 7 years. 02/2027.  Last pap smear 2020, next due 5 years.     11/20/2020    2:09 PM 03/20/2020    9:23 AM 11/18/2019    1:43 PM  Depression screen PHQ 2/9  Decreased Interest 0 1 1  Down, Depressed, Hopeless 0 1 1   PHQ - 2 Score 0 2 2  Altered sleeping '1 1 2  '$ Tired, decreased energy '1 1 1  '$ Change in appetite 0 1 0  Feeling bad or failure about yourself  0 0 1  Trouble concentrating 0 0 0  Moving slowly or fidgety/restless 0 0 0  Suicidal thoughts 0 0 0  PHQ-9 Score '2 5 6  '$ Difficult doing work/chores Not difficult at all Not difficult at all Not difficult at all      11/20/2020    2:10 PM 11/18/2019    2:07 PM 05/22/2017    8:33 AM  GAD 7 : Generalized Anxiety Score  Nervous, Anxious, on Edge 0 1 1  Control/stop worrying 0 1 1  Worry too much - different things 0 1 1  Trouble relaxing 0 1 1  Restless 0 0 0  Easily annoyed or irritable 0 0 0  Afraid - awful might happen 0 0 0  Total GAD 7 Score 0 4 4  Anxiety Difficulty Not difficult at all Not difficult at all Not difficult at all      Past Medical History:  Diagnosis Date   Allergy    Family history of adverse reaction to anesthesia    sister- PONV   Fibromyalgia    GERD (gastroesophageal reflux disease)    Hypertension    Pre-diabetes    Sleep apnea    uses CPAP   Stage III chronic kidney disease (  [place])    Wrist pain    Past Surgical History:  Procedure Laterality Date   BIOPSY  02/20/2020   Procedure: BIOPSY;  Surgeon: Lin Landsman, MD;  Location: [facility];  Service: Endoscopy;;   BREAST BIOPSY Left    core- neg   BREAST BIOPSY Left    stereo- neg   COLONOSCOPY WITH PROPOFOL N/A 02/20/2020   Procedure: COLONOSCOPY WITH PROPOFOL;  Surgeon: Lin Landsman, MD;  Location: [facility];  Service: Endoscopy;  Laterality: N/A;   ECTOPIC PREGNANCY SURGERY     FOOT SURGERY Left    HARDWARE REMOVAL Right 12/01/2020   Procedure: HARDWARE REMOVAL (WRIST);  Surgeon: Earnestine Leys, MD;  Location: [facility];  Service: Orthopedics;  Laterality: Right;   ORIF RADIAL FRACTURE Right 02/18/2019   Procedure: OPEN REDUCTION INTERNAL FIXATION (ORIF) RADIAL FRACTURE;  Surgeon: Earnestine Leys, MD;  Location:  [facility];  Service: Orthopedics;  Laterality: Right;   POLYPECTOMY  02/20/2020   Procedure: POLYPECTOMY;  Surgeon: Lin Landsman, MD;  Location: [facility];  Service: Endoscopy;;   WISDOM TOOTH EXTRACTION     Social History   Socioeconomic History   Marital status: Married    Spouse name: Not on file   Number of children: Not on file   Years of education: Not on file   Highest education level: Not on file  Occupational History   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[employer])  Tobacco Use   Smoking status: Former    Packs/day: 1.00    Years: 10.00    Total pack years: 10.00    Types: Cigarettes    Quit date: 11/23/1984    Years since quitting: 37.1   Smokeless tobacco: Former  Scientific laboratory technician Use: Never used  Substance and Sexual Activity   Alcohol use: Yes    Alcohol/week: 10.0 standard drinks of alcohol    Types: 10 Glasses of wine per week    Comment: weekly   Drug use: No   Sexual activity: Yes    Birth control/protection: Injection  Other Topics Concern   Not on file  Social History Narrative   Not on file   Social Determinants of Health   Financial Resource Strain: Not on file  Food Insecurity: Not on file  Transportation Needs: Not on file  Physical Activity: Not on file  Stress: Not on file  Social Connections: Not on file  Intimate Partner Violence: Not on file   Family History  Problem Relation Age of Onset   Hypertension Mother    Diabetes Mother    Hyperlipidemia Mother    Cancer Mother    Breast cancer Mother 44   Hypertension Father    Hyperlipidemia Father    Breast cancer Sister    Current Outpatient Medications on File Prior to Visit  Medication Sig   acetaminophen (TYLENOL) 650 MG CR tablet Take 1,300 mg by mouth 2 (two) times daily as needed for pain.   Alum Hydroxide-Mag Carbonate (GAVISCON EXTRA STRENGTH) 160-105 MG CHEW Chew 2 tablets by mouth daily as needed (heartburn).   Artificial Saliva (DRY MOUTH DROPS) LOZG Use as  directed 1 lozenge in the mouth or throat daily as needed (dry mouth).   atorvastatin (LIPITOR) 20 MG tablet TAKE 1 TABLET BY MOUTH ONCE  DAILY   buPROPion (WELLBUTRIN XL) 300 MG 24 hr tablet TAKE 1 TABLET BY MOUTH DAILY   Cholecalciferol (DIALYVITE VITAMIN D 5000) 125 MCG (5000 UT) capsule Take 5,000 Units by mouth daily.  Coenzyme Q10 (COQ10) 100 MG CAPS Take 100 mg by mouth daily.   gabapentin (NEURONTIN) 600 MG tablet TAKE 1 AND 1/2 TABLETS BY  MOUTH TWICE DAILY   lisinopril (ZESTRIL) 5 MG tablet TAKE 1 TABLET BY MOUTH  DAILY   loratadine (CLARITIN) 10 MG tablet Take 10 mg by mouth every morning.   Magnesium Bisglycinate (MAG GLYCINATE PO) Take 2 tablets by mouth 2 (two) times daily.   sertraline (ZOLOFT) 100 MG tablet TAKE 1 TABLET BY MOUTH DAILY   [DISCONTINUED] Icosapent Ethyl (VASCEPA) 1 g CAPS Take 2 capsules (2 g total) by mouth 2 (two) times daily.   No current facility-administered medications on file prior to visit.    Review of Systems Per HPI unless specifically indicated above      Objective:    BP 134/80   Pulse 88   Ht '5\' 1"'$  (1.549 m)   Wt 229 lb (103.9 kg)   SpO2 100%   BMI 43.27 kg/m   Wt Readings from Last 3 Encounters:  01/12/22 229 lb (103.9 kg)  01/03/22 238 lb (108 kg)  04/02/21 238 lb (108 kg)    Physical Exam Vitals and nursing note reviewed.  Constitutional:      General: She is not in acute distress.    Appearance: She is well-developed. She is not diaphoretic.     Comments: Well-appearing, comfortable, cooperative  HENT:     Head: Normocephalic and atraumatic.  Eyes:     General:        Right eye: No discharge.        Left eye: No discharge.     Conjunctiva/sclera: Conjunctivae normal.  Neck:     Thyroid: No thyromegaly.  Cardiovascular:     Rate and Rhythm: Normal rate and regular rhythm.     Heart sounds: Normal heart sounds. No murmur heard. Pulmonary:     Effort: Pulmonary effort is normal. No respiratory distress.     Breath  sounds: Normal breath sounds. No wheezing or rales.  Musculoskeletal:        General: Normal range of motion.     Cervical back: Normal range of motion and neck supple.  Lymphadenopathy:     Cervical: No cervical adenopathy.  Skin:    General: Skin is warm and dry.     Findings: No erythema or rash.  Neurological:     Mental Status: She is alert and oriented to person, place, and time.  Psychiatric:        Behavior: Behavior normal.     Comments: Well groomed, good eye contact, normal speech and thoughts       Results for orders placed or performed during the hospital encounter of 15/17/61  Basic metabolic panel  Result Value Ref Range   Sodium 137 135 - 145 mmol/L   Potassium 3.9 3.5 - 5.1 mmol/L   Chloride 103 98 - 111 mmol/L   CO2 25 22 - 32 mmol/L   Glucose, Bld 95 70 - 99 mg/dL   BUN 17 6 - 20 mg/dL   Creatinine, Ser 1.31 (H) 0.44 - 1.00 mg/dL   Calcium 9.2 8.9 - 10.3 mg/dL   GFR, Estimated 47 (L) >60 mL/min   Anion gap 9 5 - 15      Assessment & Plan:   Problem List Items Addressed This Visit   None Visit Diagnoses     Annual physical exam    -  Primary   Relevant Orders   CBC with Differential/Platelet  Lipid panel   Hemoglobin A1c   Comprehensive metabolic panel   TSH   Needs flu shot       Relevant Orders   Flu Vaccine QUAD 42moIM (Fluarix, Fluzone & Alfiuria Quad PF)   Encounter for screening mammogram for malignant neoplasm of breast       Relevant Orders   MM 3D SCREEN BREAST BILATERAL   Vitamin D deficiency       Relevant Orders   VITAMIN D 25 Hydroxy (Vit-D Deficiency, Fractures)   Vitamin B12 nutritional deficiency       Relevant Orders   Vitamin B12      Updated Health Maintenance information Fasting labs ordered, [facility], given to patient today Encouraged improvement to lifestyle with diet and exercise Goal of weight loss  Recommend CCM referral if eligible RN case manager / LCSW for caregiver support with chronic health conditions  and emotional support  Referral sent to CARE COORDINATION insttead of CCM since not medicare  Orders Placed This Encounter  Procedures   MM 3D SCREEN BREAST BILATERAL    Standing Status:   Future    Standing Expiration Date:   01/13/2023    Order Specific Question:   Reason for Exam (SYMPTOM  OR DIAGNOSIS REQUIRED)    Answer:   Screening bilateral 3D Mammogram [place]    Order Specific Question:   Preferred imaging location?    Answer:   [facility]   CBC with Differential/Platelet   Lipid panel    Order Specific Question:   Has the patient fasted?    Answer:   Yes   Hemoglobin A1c   Comprehensive metabolic panel    Order Specific Question:   Has the patient fasted?    Answer:   Yes   TSH   Vitamin B12   VITAMIN D 25 Hydroxy (Vit-D Deficiency, Fractures)   AMB Referral to [facility])    Referral Priority:   Routine    Referral Type:   Consultation    Referral Reason:   Care Coordination    Number of Visits Requested:   1     No orders of the defined types were placed in this encounter.     Follow up plan: Return in about 6 months (around 07/14/2022) for 6 month follow-up HTN, OSA, Mood updates .  ANobie Putnam DO [facility] 01/12/2022, 4:21 PM

## 2022-01-14 NOTE — Addendum Note (Signed)
Addended by: Olin Hauser on: 01/14/2022 11:37 AM   Modules accepted: Orders

## 2022-01-15 LAB — CBC WITH DIFFERENTIAL/PLATELET
Basophils Absolute: 0 10*3/uL (ref 0.0–0.2)
Basos: 1 %
EOS (ABSOLUTE): 0.2 10*3/uL (ref 0.0–0.4)
Eos: 3 %
Hematocrit: 42.1 % (ref 34.0–46.6)
Hemoglobin: 13.6 g/dL (ref 11.1–15.9)
Immature Grans (Abs): 0 10*3/uL (ref 0.0–0.1)
Immature Granulocytes: 0 %
Lymphocytes Absolute: 1.5 10*3/uL (ref 0.7–3.1)
Lymphs: 27 %
MCH: 29.6 pg (ref 26.6–33.0)
MCHC: 32.3 g/dL (ref 31.5–35.7)
MCV: 92 fL (ref 79–97)
Monocytes Absolute: 0.8 10*3/uL (ref 0.1–0.9)
Monocytes: 13 %
Neutrophils Absolute: 3.1 10*3/uL (ref 1.4–7.0)
Neutrophils: 56 %
Platelets: 271 10*3/uL (ref 150–450)
RBC: 4.59 x10E6/uL (ref 3.77–5.28)
RDW: 13.4 % (ref 11.7–15.4)
WBC: 5.6 10*3/uL (ref 3.4–10.8)

## 2022-01-15 LAB — COMPREHENSIVE METABOLIC PANEL
ALT: 20 IU/L (ref 0–32)
AST: 18 IU/L (ref 0–40)
Albumin/Globulin Ratio: 2.2 (ref 1.2–2.2)
Albumin: 4.3 g/dL (ref 3.9–4.9)
Alkaline Phosphatase: 110 IU/L (ref 44–121)
BUN/Creatinine Ratio: 13 (ref 12–28)
BUN: 14 mg/dL (ref 8–27)
Bilirubin Total: 0.3 mg/dL (ref 0.0–1.2)
CO2: 23 mmol/L (ref 20–29)
Calcium: 9.6 mg/dL (ref 8.7–10.3)
Chloride: 99 mmol/L (ref 96–106)
Creatinine, Ser: 1.07 mg/dL — ABNORMAL HIGH (ref 0.57–1.00)
Globulin, Total: 2 g/dL (ref 1.5–4.5)
Glucose: 110 mg/dL — ABNORMAL HIGH (ref 70–99)
Potassium: 5 mmol/L (ref 3.5–5.2)
Sodium: 135 mmol/L (ref 134–144)
Total Protein: 6.3 g/dL (ref 6.0–8.5)
eGFR: 59 mL/min/{1.73_m2} — ABNORMAL LOW (ref >59)

## 2022-01-15 LAB — HEMOGLOBIN A1C
Est. average glucose Bld gHb Est-mCnc: 126 mg/dL
Hgb A1c MFr Bld: 6 % — ABNORMAL HIGH (ref 4.8–5.6)

## 2022-01-15 LAB — LIPID PANEL
Chol/HDL Ratio: 3.9 ratio (ref 0.0–4.4)
Cholesterol, Total: 197 mg/dL (ref 100–199)
HDL: 50 mg/dL (ref >39)
LDL Chol Calc (NIH): 121 mg/dL — ABNORMAL HIGH (ref 0–99)
Triglycerides: 148 mg/dL (ref 0–149)
VLDL Cholesterol Cal: 26 mg/dL (ref 5–40)

## 2022-01-15 LAB — TSH: TSH: 3.74 u[IU]/mL (ref 0.450–4.500)

## 2022-01-15 LAB — VITAMIN B12: Vitamin B-12: 342 pg/mL (ref 232–1245)

## 2022-01-15 LAB — VITAMIN D 25 HYDROXY (VIT D DEFICIENCY, FRACTURES): Vit D, 25-Hydroxy: 43.5 ng/mL (ref 30.0–100.0)

## 2022-01-27 ENCOUNTER — Telehealth: Payer: Self-pay | Admitting: *Deleted

## 2022-01-27 NOTE — Progress Notes (Signed)
  Care Coordination   Note   01/27/2022 Name: Rachel Townsend MRN: [id number] DOB: 01-Mar-1960  Rachel Townsend is a 61 y.o. year old female who sees Olin Hauser, DO for primary care. I reached out to [employer] by phone today to offer care coordination services.  [name] was given information about Care Coordination services today including:   The Care Coordination services include support from the care team which includes your Nurse Coordinator, Clinical Social Worker, or Pharmacist.  The Care Coordination team is here to help remove barriers to the health concerns and goals most important to you. Care Coordination services are voluntary, and the patient may decline or stop services at any time by request to their care team member.   Care Coordination Consent Status: Patient agreed to services and verbal consent obtained.   Follow up plan:  Telephone appointment with care coordination team member scheduled for:  02/02/2022 and 02/04/2022  Encounter Outcome:  Pt. Scheduled from referral   Julian Hy, [place] Direct Dial: [phone]

## 2022-02-02 ENCOUNTER — Encounter: Payer: Self-pay | Admitting: *Deleted

## 2022-02-02 ENCOUNTER — Ambulatory Visit: Payer: Self-pay | Admitting: *Deleted

## 2022-02-02 NOTE — Patient Instructions (Signed)
Visit Information  Thank you for taking time to visit with me today. Please don't hesitate to contact me if I can be of assistance to you before our next scheduled telephone appointment.  Following are the goals we discussed today:  Consider exercise programs, walking, etc.  Monitor carbohydrates and foods with increased cholesterol.   Our next appointment is by telephone on 2/9  Please call the care guide team at [phone] if you need to cancel or reschedule your appointment.   Please call the Suicide and Crisis Lifeline: 988 call the [place] National Suicide Prevention Lifeline: (734) 706-2976 or TTY: 815-685-3217 TTY 332-798-7699) to talk to a trained counselor call 1-800-273-TALK (toll free, 24 hour hotline) call 911 if you are experiencing a Mental Health or [place] or need someone to talk to.  The patient verbalized understanding of instructions, educational materials, and care plan provided today and agreed to receive a mailed copy of patient instructions, educational materials, and care plan.   The patient has been provided with contact information for the care management team and has been advised to call with any health related questions or concerns.   Valente David, RN, MSN, [place] Care Management Care Management Coordinator [phone]

## 2022-02-02 NOTE — Patient Outreach (Signed)
  Care Coordination   Initial Visit Note   02/02/2022 Name: Rachel Townsend MRN: [id number] DOB: 08/20/1960  Rachel Townsend is a 62 y.o. year old female who sees Olin Hauser, DO for primary care. I spoke with  Rachel Townsend by phone today.  [name]  Overwhelmed with management of her health, husband's health, and household.  Would like to do better with diet and exercise.     Goals Addressed             This Visit's Progress    Chronic condition management - HLD       Care Coordination Interventions: Provider established cholesterol goals reviewed Counseled on importance of regular laboratory monitoring as prescribed Provided HLD educational materials Reviewed role and benefits of statin for ASCVD risk reduction Reviewed importance of limiting foods high in cholesterol Reviewed exercise goals and target of 150 minutes per week Screening for signs and symptoms of depression related to chronic disease state Assessed social determinant of health barriers  Last seen by PCP on 12/18, advised that total cholesterol decreased to 197. Will plan to follow up in 6 months Scheduled visit with CSW on 1/5 and for mammogram on 1/16         SDOH assessments and interventions completed:  Yes  SDOH Interventions Today    Flowsheet Row Most Recent Value  SDOH Interventions   Food Insecurity Interventions Intervention Not Indicated  Housing Interventions Intervention Not Indicated  Transportation Interventions Intervention Not Indicated        Care Coordination Interventions:  Yes, provided   Follow up plan: Follow up call scheduled for 2/9    Encounter Outcome:  Pt. Visit Completed   Valente David, RN, MSN, [place] Care Management Care Management Coordinator [phone]

## 2022-02-04 ENCOUNTER — Ambulatory Visit: Payer: Self-pay | Admitting: *Deleted

## 2022-02-04 NOTE — Patient Outreach (Signed)
  Care Coordination   Initial Visit Note   02/04/2022 Name: Rachel Townsend MRN: [id number] DOB: 04-30-1960  Rachel Townsend is a 63 y.o. year old female who sees Olin Hauser, DO for primary care. I spoke with  [name] by phone today.  [name]  Managing anxiety    Goals Addressed             This Visit's Progress    Managing  my anxiety       Care Coordination Interventions: Emotional support provided in regards to financial concerns and caregiver stress Depression screen reviewed  Active listening / Reflection utilized  Caregiver stress acknowledged  Participation in counseling encouraged  Verbalization of feelings encouraged  [name]: increasing socialization with friends and family, hobbies, managing expectations  Discussed caregiver resources and support: Possible consult with Financial advisor         SDOH assessments and interventions completed:  Yes  SDOH Interventions Today    Flowsheet Row Most Recent Value  SDOH Interventions   Food Insecurity Interventions Intervention Not Indicated  Housing Interventions Intervention Not Indicated  Transportation Interventions Intervention Not Indicated        Care Coordination Interventions:  Yes, provided   Follow up plan: Follow up call scheduled for 02/18/22    Encounter Outcome:  Pt. Visit Completed

## 2022-02-04 NOTE — Patient Instructions (Signed)
Visit Information  Thank you for taking time to visit with me today. Please don't hesitate to contact me if I can be of assistance to you.   Following are the goals we discussed today:   Goals Addressed             This Visit's Progress    Managing  my anxiety       Care Coordination Interventions: Emotional support provided in regards to financial concerns and caregiver stress Depression screen reviewed  Active listening / Reflection utilized  Caregiver stress acknowledged  Participation in counseling encouraged  Verbalization of feelings encouraged  [name]: increasing socialization with friends and family, hobbies, managing expectations  Discussed caregiver resources and support: Possible consult with Financial advisor         Our next appointment is by telephone on 02/18/22 at [place]  Please call the care guide team at [phone] if you need to cancel or reschedule your appointment.   If you are experiencing a Mental Health or [place] or need someone to talk to, please call the Suicide and Crisis Lifeline: 988   Patient verbalizes understanding of instructions and care plan provided today and agrees to view in [place]. Active MyChart status and patient understanding of how to access instructions and care plan via MyChart confirmed with patient.     Telephone follow up appointment with care management team member scheduled for: 02/18/22  Elliot Gurney, [place] Worker  [facility] Care Management [phone]

## 2022-02-15 ENCOUNTER — Ambulatory Visit
Admission: RE | Admit: 2022-02-15 | Discharge: 2022-02-15 | Disposition: A | Discharge status: Home / Self Care | Payer: Managed Care, Other (non HMO) | Source: Ambulatory Visit | Attending: Family Medicine | Admitting: Family Medicine

## 2022-02-15 DIAGNOSIS — Z1231 Encounter for screening mammogram for malignant neoplasm of breast: Secondary | ICD-10-CM | POA: Diagnosis present

## 2022-02-18 ENCOUNTER — Ambulatory Visit: Payer: Self-pay | Admitting: *Deleted

## 2022-02-18 NOTE — Patient Outreach (Signed)
  Care Coordination   Follow Up Visit Note   02/18/2022 Name: Rachel Townsend MRN: [id number] DOB: 1960-09-19  Rachel Townsend is a 62 y.o. year old female who sees Rachel Hauser, DO for primary care. I spoke with  Rachel Townsend by phone today.  [name]  Financial management    Goals Addressed             This Visit's Progress    Managing  my anxiety       Care Coordination Interventions: Emotional support provided in regards to financial concerns and caregiver stress [name] [name], taking things day by day Active listening / Reflection utilized  Verbalization of feelings encouraged  [name]: continue to increase socialization with friends and family as previously discussed, hobbies and managing expectations           SDOH assessments and interventions completed:  No     Care Coordination Interventions:  Yes, provided   Follow up plan: No further intervention required.   Encounter Outcome:  Pt. Visit Completed

## 2022-02-18 NOTE — Patient Instructions (Signed)
Visit Information  Thank you for taking time to visit with me today. Please don't hesitate to contact me if I can be of assistance to you.   Following are the goals we discussed today:   Goals Addressed             This Visit's Progress    Managing  my anxiety       Care Coordination Interventions: Emotional support provided in regards to financial concerns and caregiver stress [name] [name], taking things day by day Active listening / Reflection utilized  Verbalization of feelings encouraged  [name]: continue to increase socialization with friends and family as previously discussed, hobbies and managing expectations            Please call the care guide team at [phone] if you need to cancel or reschedule your appointment.   If you are experiencing a Mental Health or [place] or need someone to talk to, please call the Suicide and Crisis Lifeline: 988   Patient verbalizes understanding of instructions and care plan provided today and agrees to view in [place]. Active MyChart status and patient understanding of how to access instructions and care plan via MyChart confirmed with patient.     Telephone follow up appointment with care management team member scheduled for: 03/11/22 with Alexis, [place] Worker  [facility] Care Management [phone]

## 2022-03-04 ENCOUNTER — Other Ambulatory Visit: Payer: Self-pay | Admitting: Family Medicine

## 2022-03-04 DIAGNOSIS — I10 Essential (primary) hypertension: Secondary | ICD-10-CM

## 2022-03-04 NOTE — Telephone Encounter (Signed)
Requested Prescriptions  Pending Prescriptions Disp Refills   lisinopril (ZESTRIL) 5 MG tablet [Pharmacy Med Name: Lisinopril 5 MG Oral Tablet] 90 tablet 1    Sig: TAKE 1 TABLET BY MOUTH DAILY     Cardiovascular:  ACE Inhibitors Failed - 03/04/2022  4:40 AM      Failed - Cr in normal range and within 180 days    Creatinine, Ser  Date Value Ref Range Status  01/14/2022 1.07 (H) 0.57 - 1.00 mg/dL Final         Passed - K in normal range and within 180 days    Potassium  Date Value Ref Range Status  01/14/2022 5.0 3.5 - 5.2 mmol/L Final         Passed - Patient is not pregnant      Passed - Last BP in normal range    BP Readings from Last 1 Encounters:  01/12/22 134/80         Passed - Valid encounter within last 6 months    Recent Outpatient Visits           1 month ago Annual physical exam   [facility] Olin Hauser, DO   2 months ago COVID-19 virus infection   [place], DO   11 months ago Acute pain of right knee   [place], DO   1 year ago Annual physical exam   [facility] Olin Hauser, DO   1 year ago Benign hypertension with CKD (chronic kidney disease) stage III   [place], [place]

## 2022-03-11 ENCOUNTER — Ambulatory Visit: Payer: Self-pay | Admitting: *Deleted

## 2022-03-11 NOTE — Patient Outreach (Signed)
  Care Coordination   Follow Up Visit Note   03/11/2022 Name: Rachel Townsend MRN: [id number] DOB: 08-26-1960  Rachel Townsend is a 62 y.o. year old female who sees Olin Hauser, DO for primary care. I spoke with  [name] by phone today.  [name]  Report she feels things have "settled down" and she is now able to manage health well.  Denies any urgent concerns, encouraged to contact this care manager with questions.      Goals Addressed             This Visit's Progress    COMPLETED: Chronic condition management - HLD   On track    Care Coordination Interventions: Provider established cholesterol goals reviewed Counseled on importance of regular laboratory monitoring as prescribed Provided HLD educational materials Reviewed role and benefits of statin for ASCVD risk reduction Reviewed importance of limiting foods high in cholesterol Reviewed exercise goals and target of 150 minutes per week Screening for signs and symptoms of depression related to chronic disease state Assessed social determinant of health barriers  Last seen by PCP on 12/18, advised that total cholesterol decreased to 197. Will plan to follow up in 6 months - reminded to call to schedule follow up for June Scheduled visit with CSW on 1/5 and for mammogram on 1/16 - both appointments completed, no issues reported         SDOH assessments and interventions completed:  No     Care Coordination Interventions:  Yes, provided   Follow up plan: No further intervention required.   Encounter Outcome:  Pt. Visit Completed   Valente David, RN, MSN, [place] Care Management Care Management Coordinator [phone]

## 2022-03-31 ENCOUNTER — Other Ambulatory Visit: Payer: Self-pay

## 2022-03-31 ENCOUNTER — Telehealth: Payer: Self-pay | Admitting: Family Medicine

## 2022-03-31 DIAGNOSIS — I10 Essential (primary) hypertension: Secondary | ICD-10-CM

## 2022-03-31 MED ORDER — LISINOPRIL 5 MG PO TABS: 5.0000 mg | ORAL_TABLET | Freq: Every day | ORAL | 1 refills | Status: DC

## 2022-03-31 NOTE — Telephone Encounter (Signed)
Patient is calling in because she wants her prescriptions sent to the [facility] and not through the mail order service.

## 2022-03-31 NOTE — Telephone Encounter (Signed)
I have spoken with the patient.

## 2022-04-05 ENCOUNTER — Other Ambulatory Visit: Payer: Self-pay | Admitting: Internal Medicine

## 2022-04-05 DIAGNOSIS — F331 Major depressive disorder, recurrent, moderate: Secondary | ICD-10-CM

## 2022-04-06 NOTE — Telephone Encounter (Signed)
Requested Prescriptions  Pending Prescriptions Disp Refills   buPROPion (WELLBUTRIN XL) 300 MG 24 hr tablet [Pharmacy Med Name: BUPROPION XL '300MG'$  TABLETS] 90 tablet 0    Sig: TAKE 1 TABLET BY MOUTH DAILY     Psychiatry: Antidepressants - bupropion Failed - 04/05/2022  5:13 PM      Failed - Cr in normal range and within 360 days    Creatinine, Ser  Date Value Ref Range Status  01/14/2022 1.07 (H) 0.57 - 1.00 mg/dL Final         Passed - AST in normal range and within 360 days    AST  Date Value Ref Range Status  01/14/2022 18 0 - 40 IU/L Final         Passed - ALT in normal range and within 360 days    ALT  Date Value Ref Range Status  01/14/2022 20 0 - 32 IU/L Final         Passed - Completed PHQ-2 or PHQ-9 in the last 360 days      Passed - Last BP in normal range    BP Readings from Last 1 Encounters:  01/12/22 134/80         Passed - Valid encounter within last 6 months    Recent Outpatient Visits           2 months ago Annual physical exam   [place], DO   3 months ago COVID-19 virus infection   [place], DO   1 year ago Acute pain of right knee   [place], DO   1 year ago Annual physical exam   [facility] Olin Hauser, DO   2 years ago Benign hypertension with CKD (chronic kidney disease) stage III   [place], [place]

## 2022-06-14 ENCOUNTER — Other Ambulatory Visit: Payer: Self-pay | Admitting: Family Medicine

## 2022-06-14 DIAGNOSIS — F5105 Insomnia due to other mental disorder: Secondary | ICD-10-CM

## 2022-06-14 DIAGNOSIS — M797 Fibromyalgia: Secondary | ICD-10-CM

## 2022-06-14 DIAGNOSIS — E782 Mixed hyperlipidemia: Secondary | ICD-10-CM

## 2022-06-14 MED ORDER — GABAPENTIN 600 MG PO TABS: 900.0000 mg | ORAL_TABLET | Freq: Two times a day (BID) | ORAL | 0 refills | Status: DC

## 2022-06-14 NOTE — Telephone Encounter (Signed)
Medication Refill - Medication:   gabapentin (NEURONTIN) 600 MG tablet,   sertraline (ZOLOFT) 100 MG tablet    atorvastatin (LIPITOR) 20 MG tablet      Has the patient contacted their pharmacy? No.  Preferred Pharmacy (with phone number or street name):  [id number] - [place], [place] - [address] AT [facility] [address] [place]  [address] [place] [address]  Phone: [phone] Fax: [phone]  Hours: Not open 24 hours       Has the patient been seen for an appointment in the last year OR does the patient have an upcoming appointment? Yes.    Agent: Please be advised that RX refills may take up to 3 business days. We ask that you follow-up with your pharmacy.

## 2022-06-14 NOTE — Telephone Encounter (Signed)
Request for Lipitor and Zoloft are too soon.  Requested Prescriptions  Pending Prescriptions Disp Refills   gabapentin (NEURONTIN) 600 MG tablet 270 tablet 0    Sig: Take 1.5 tablets (900 mg total) by mouth 2 (two) times daily.     Neurology: Anticonvulsants - gabapentin Failed - 06/14/2022  1:11 PM      Failed - Cr in normal range and within 360 days    Creatinine, Ser  Date Value Ref Range Status  01/14/2022 1.07 (H) 0.57 - 1.00 mg/dL Final         Passed - Completed PHQ-2 or PHQ-9 in the last 360 days      Passed - Valid encounter within last 12 months    Recent Outpatient Visits           5 months ago Annual physical exam   [place] [facility] Smitty Cords, DO   5 months ago COVID-19 virus infection   [place] [facility] [place], Netta Neat, DO   1 year ago Acute pain of right knee   [place] [facility] Smitty Cords, DO   1 year ago Annual physical exam   [place] [facility] Smitty Cords, DO   2 years ago Benign hypertension with CKD (chronic kidney disease) stage III   [place] [facility] [place], Netta Neat, DO               sertraline (ZOLOFT) 100 MG tablet 90 tablet 3    Sig: Take 1 tablet (100 mg total) by mouth daily.     Psychiatry:  Antidepressants - SSRI - sertraline Passed - 06/14/2022  1:11 PM      Passed - AST in normal range and within 360 days    AST  Date Value Ref Range Status  01/14/2022 18 0 - 40 IU/L Final         Passed - ALT in normal range and within 360 days    ALT  Date Value Ref Range Status  01/14/2022 20 0 - 32 IU/L Final         Passed - Completed PHQ-2 or PHQ-9 in the last 360 days      Passed - Valid encounter within last 6 months    Recent Outpatient Visits           5 months ago Annual physical exam   [place] [facility] [place], Netta Neat, DO   5  months ago COVID-19 virus infection   [facility] [place], Netta Neat, DO   1 year ago Acute pain of right knee   [place] [facility] Smitty Cords, DO   1 year ago Annual physical exam   [place] Cords, DO   2 years ago Benign hypertension with CKD (chronic kidney disease) stage III   [place] [facility] [place], Netta Neat, DO               atorvastatin (LIPITOR) 20 MG tablet 90 tablet 3    Sig: Take 1 tablet (20 mg total) by mouth daily.     Cardiovascular:  Antilipid - Statins Failed - 06/14/2022  1:11 PM      Failed - Lipid Panel in normal range within the last 12 months    Cholesterol, Total  Date Value Ref Range Status  01/14/2022 197 100 - 199 mg/dL Final   LDL Chol Calc (NIH)  Date Value Ref Range Status  01/14/2022 121 (H) 0 - 99 mg/dL Final   HDL  Date Value Ref Range Status  01/14/2022 50 >39 mg/dL Final   Triglycerides  Date Value Ref Range Status  01/14/2022 148 0 - 149 mg/dL Final         Passed - Patient is not pregnant      Passed - Valid encounter within last 12 months    Recent Outpatient Visits           5 months ago Annual physical exam   [place] [facility], Netta Neat, DO   5 months ago COVID-19 virus infection   [place] [facility] [place], Netta Neat, DO   1 year ago Acute pain of right knee   [place] [facility] Smit[facility] Cords, DO   1 year ago Annual physical exam   [place] [facility] Smitty Cords, DO   2 years ago Benign hypertension with CKD (chronic kidney disease) stage III    [facility] [place], Netta Neat, [place]

## 2022-07-04 ENCOUNTER — Other Ambulatory Visit: Payer: Self-pay | Admitting: Family Medicine

## 2022-07-04 DIAGNOSIS — F331 Major depressive disorder, recurrent, moderate: Secondary | ICD-10-CM

## 2022-07-05 NOTE — Telephone Encounter (Signed)
Requested Prescriptions  Pending Prescriptions Disp Refills   buPROPion (WELLBUTRIN XL) 300 MG 24 hr tablet [Pharmacy Med Name: BUPROPION XL 300MG  TABLETS] 90 tablet 0    Sig: TAKE 1 TABLET BY MOUTH DAILY     Psychiatry: Antidepressants - bupropion Failed - 07/04/2022  3:16 AM      Failed - Cr in normal range and within 360 days    Creatinine, Ser  Date Value Ref Range Status  01/14/2022 1.07 (H) 0.57 - 1.00 mg/dL Final         Passed - AST in normal range and within 360 days    AST  Date Value Ref Range Status  01/14/2022 18 0 - 40 IU/L Final         Passed - ALT in normal range and within 360 days    ALT  Date Value Ref Range Status  01/14/2022 20 0 - 32 IU/L Final         Passed - Completed PHQ-2 or PHQ-9 in the last 360 days      Passed - Last BP in normal range    BP Readings from Last 1 Encounters:  01/12/22 134/80         Passed - Valid encounter within last 6 months    Recent Outpatient Visits           5 months ago Annual physical exam   [place] [facility] Smitty Cords, DO   6 months ago COVID-19 virus infection   [place] [facility] [place], Netta Neat, DO   1 year ago Acute pain of right knee   [place] [facility] Smitty Cords, DO   1 year ago Annual physical exam   [place] [facility] Smitty Cords, DO   2 years ago Benign hypertension with CKD (chronic kidney disease) stage III   [place] [facility] [place], Netta Neat, [place]

## 2022-07-29 LAB — LIPID PANEL
Cholesterol: 199 (ref 0–200)
HDL: 50 (ref 35–70)
LDL Cholesterol: 119
Triglycerides: 172 — AB (ref 40–160)

## 2022-07-29 LAB — HEMOGLOBIN A1C: Hemoglobin A1C: 6

## 2022-07-29 LAB — BASIC METABOLIC PANEL: Glucose: 100

## 2022-09-12 ENCOUNTER — Other Ambulatory Visit: Payer: Self-pay | Admitting: Family Medicine

## 2022-09-12 DIAGNOSIS — M797 Fibromyalgia: Secondary | ICD-10-CM

## 2022-09-16 ENCOUNTER — Other Ambulatory Visit: Payer: Self-pay | Admitting: Family Medicine

## 2022-09-16 DIAGNOSIS — F5105 Insomnia due to other mental disorder: Secondary | ICD-10-CM

## 2022-09-16 NOTE — Telephone Encounter (Signed)
OV needed for additional refills, 30 day supply given until OV can be made.  Requested Prescriptions  Pending Prescriptions Disp Refills   sertraline (ZOLOFT) 100 MG tablet [Pharmacy Med Name: SERTRALINE 100MG  TABLETS] 30 tablet 0    Sig: TAKE 1 TABLET BY MOUTH DAILY     Psychiatry:  Antidepressants - SSRI - sertraline Failed - 09/16/2022  3:19 AM      Failed - Valid encounter within last 6 months    Recent Outpatient Visits           8 months ago Annual physical exam   [place] [facility] Smit[facility] Cords, DO   8 months ago COVID-19 virus infection   [place] [facility] [place], Netta Neat, DO   1 year ago Acute pain of right knee   [place] [facility] Smitty Cords, DO   1 year ago Annual physical exam   [place] [facility] Smit[facility] Cords, DO   2 years ago Benign hypertension with CKD (chronic kidney disease) stage III   [place] [facility] [place], Netta Neat, DO              Passed - AST in normal range and within 360 days    AST  Date Value Ref Range Status  01/14/2022 18 0 - 40 IU/L Final         Passed - ALT in normal range and within 360 days    ALT  Date Value Ref Range Status  01/14/2022 20 0 - 32 IU/L Final         Passed - Completed PHQ-2 or PHQ-9 in the last 360 days

## 2022-09-26 ENCOUNTER — Other Ambulatory Visit: Payer: Self-pay | Admitting: Family Medicine

## 2022-09-26 DIAGNOSIS — I10 Essential (primary) hypertension: Secondary | ICD-10-CM

## 2022-09-27 NOTE — Telephone Encounter (Signed)
Appt-10/11/22 Requested Prescriptions  Pending Prescriptions Disp Refills   lisinopril (ZESTRIL) 5 MG tablet [Pharmacy Med Name: LISINOPRIL 5MG  TABLETS] 90 tablet 1    Sig: TAKE 1 TABLET(5 MG) BY MOUTH DAILY     Cardiovascular:  ACE Inhibitors Failed - 09/26/2022  3:16 AM      Failed - Cr in normal range and within 180 days    Creatinine, Ser  Date Value Ref Range Status  01/14/2022 1.07 (H) 0.57 - 1.00 mg/dL Final         Failed - K in normal range and within 180 days    Potassium  Date Value Ref Range Status  01/14/2022 5.0 3.5 - 5.2 mmol/L Final         Failed - Valid encounter within last 6 months    Recent Outpatient Visits           8 months ago Annual physical exam   [place] [facility] Smitty Cords, DO   8 months ago COVID-19 virus infection   [place] [facility] [place], Netta Neat, DO   1 year ago Acute pain of right knee   [place] [facility] Smitty Cords, DO   1 year ago Annual physical exam   [place] [facility] Smitty Cords, DO   2 years ago Benign hypertension with CKD (chronic kidney disease) stage III   [place] [facility] [place], Netta Neat, DO       Future Appointments             In 2 weeks Althea Charon, Netta Neat, DO [place] [facility], [facility]            Passed - Patient is not pregnant      Passed - Last BP in normal range    BP Readings from Last 1 Encounters:  01/12/22 134/80

## 2022-10-03 ENCOUNTER — Other Ambulatory Visit: Payer: Self-pay | Admitting: Family Medicine

## 2022-10-03 DIAGNOSIS — F331 Major depressive disorder, recurrent, moderate: Secondary | ICD-10-CM

## 2022-10-11 ENCOUNTER — Other Ambulatory Visit: Payer: Self-pay | Admitting: Family Medicine

## 2022-10-11 ENCOUNTER — Ambulatory Visit: Payer: Managed Care, Other (non HMO) | Admitting: Family Medicine

## 2022-10-11 ENCOUNTER — Encounter: Payer: Self-pay | Admitting: Family Medicine

## 2022-10-11 VITALS — BP 132/76 | HR 69 | Ht 61.0 in | Wt 238.6 lb

## 2022-10-11 DIAGNOSIS — E559 Vitamin D deficiency, unspecified: Secondary | ICD-10-CM

## 2022-10-11 DIAGNOSIS — G4733 Obstructive sleep apnea (adult) (pediatric): Secondary | ICD-10-CM

## 2022-10-11 DIAGNOSIS — Z6841 Body Mass Index (BMI) 40.0 and over, adult: Secondary | ICD-10-CM

## 2022-10-11 DIAGNOSIS — N183 Chronic kidney disease, stage 3 unspecified: Secondary | ICD-10-CM

## 2022-10-11 DIAGNOSIS — Z0000 Encounter for general adult medical examination without abnormal findings: Secondary | ICD-10-CM

## 2022-10-11 DIAGNOSIS — Z23 Encounter for immunization: Secondary | ICD-10-CM | POA: Diagnosis not present

## 2022-10-11 DIAGNOSIS — N1831 Chronic kidney disease, stage 3a: Secondary | ICD-10-CM

## 2022-10-11 DIAGNOSIS — F3341 Major depressive disorder, recurrent, in partial remission: Secondary | ICD-10-CM

## 2022-10-11 DIAGNOSIS — I129 Hypertensive chronic kidney disease with stage 1 through stage 4 chronic kidney disease, or unspecified chronic kidney disease: Secondary | ICD-10-CM

## 2022-10-11 DIAGNOSIS — R7303 Prediabetes: Secondary | ICD-10-CM | POA: Insufficient documentation

## 2022-10-11 DIAGNOSIS — F5105 Insomnia due to other mental disorder: Secondary | ICD-10-CM

## 2022-10-11 DIAGNOSIS — E538 Deficiency of other specified B group vitamins: Secondary | ICD-10-CM

## 2022-10-11 DIAGNOSIS — F99 Mental disorder, not otherwise specified: Secondary | ICD-10-CM

## 2022-10-11 NOTE — Patient Instructions (Addendum)
Thank you for coming to the office today.  --------------------------------------   Call insurance find cost and coverage of the following - check the following: - Drug Tier, Preferred List, On Formulary - All will require a "Prior Authorization" from [place] first, before you can find out the cost - Find out if there is "Step Therapy" (other medicines required before you can try these)  Once you pick the one you want to try, let me know - we can get a sample ready IF we have it in stock. Then try it - and before running out of medicine, contact me back to order your Rx so we have time to get it processed.  For Weight Loss / Obesity only  Zepbound (Tirzepatide) starting dose 2.5mg  weekly inj, best on the market for wt loss.  NWGNFA (same as Ozempic) weekly injection - start 0.25mg  weekly, 1 dose per pen, single use, auto-injector   3 benefits - 1 significantly reduced A1c sugar, and may be able to reduce or stop metformin in future - 2 reduced appetite and weight loss with good results - 3 cardiovascular risk reduction, less likely to have heart attack/stroke    For Weight Loss / Obesity only  Contrave - oral medication, appetite suppression has wellbutrin/bupropion and naltrexone in it and it can also help with appetite, it is ordered through a speciality pharmacy. - $99 per month  Free sample 7 day, 1 pill per day for 1 week  Alternative options  Semaglutide injection (mixed Ozempic) from [facility] 0.25mg  weekly for 4 weeks then increase to 0.5mg  weekly It comes in a vial and a needle syringe, you need to draw up the shot and self admin it weekly Cost is about $200 per month Call them to check pricing and availability  [facility] Address: [address] [place], [place] [address] Phone: [phone]  --------------------------------  Doctors office in [place], does not accept insurance. Call to schedule / Walk in to schedule They can do the  weekly weight loss injections (same as Ozempic) Pay per visit and per shot. It may be approximately $60-75+ per visit/shot, approximately $200-300 range per month depending  [facility] Address: [address] [place], [place] [address] Phone: [phone]   Please schedule a Follow-up Appointment to: Return in about 3 months (around 01/10/2023) for 3 month Annual Physical ([facility] orders).  If you have any other questions or concerns, please feel free to call the office or send a message through MyChart. You may also schedule an earlier appointment if necessary.  Additionally, you may be receiving a survey about your experience at our office within a few days to 1 week by e-mail or mail. We value your feedback.  Saralyn Pilar, DO [facility], [place]

## 2022-10-11 NOTE — Progress Notes (Unsigned)
Subjective:    Patient ID: Rachel Townsend, female    DOB: [birth year], 62 y.o.   MRN: [id number]  Rachel Townsend is a 62 y.o. female presenting on 10/11/2022 for Hypertension   HPI  Lifestyle / Morbid Obesity BMI >45  Diet limited with her husband at home more often eats more sweets She has done well with Keto diet and Weight Watchers and Curves Weight up again 10 lbs, attributed to diet Last A1c 6.0 Cholesterol improved  More sedentary   She has experienced caregiver burden and impacting her mood and fibromyalgia   CHRONIC HTN / CKD-III Reports no concerns, not checking BP at home. Current Meds - Lisinopril 5mg    Reports good compliance, took meds today. Tolerating well, w/o complaints.    FOLLOW-UP Insomnia / OSA on CPAP Continued treat with CPAP for OSA Improved sleep with CPAP. She adheres to machine and uses it nightly avg >8 hours nightly - No new concerns or symptoms - Insomnia is improved now   FOLLOW-UP DEPRESSION / FIBROMYALGIA: See prior notes for background information. - Today patient reports no new concerns. Mood remains improved much better - Still has generalized pain from fibromyalgia, but seems improved with better sleep - Continues Sertraline and Wellbutrin - Takes Gabapentin 900mg  (1.5 tabs 600mg ) BID No longer taking NSAID Etolodac     Health Maintenance:    Flu Shot today  Last mammogram 11/2018 abnormal, had Ultrasound done 12/2018 Mammogramed updated 03/11/20 - birads 1 negative, repeat when ready, she missed it this year, and plans to call to schedule now.   Last colonoscopy done by Dr Allegra Lai 02/20/20 - had polyps with tubular adenoma and hyperplastic, repeat colonoscopy in 7 years. 02/2027.   Last pap smear 2020, next due 5 years.      10/11/2022   10:40 AM 02/04/2022    9:43 AM 11/20/2020    2:09 PM  Depression screen PHQ 2/9  Decreased Interest 1 0 0  Down, Depressed, Hopeless 1 1 0  PHQ - 2 Score 2 1 0  Altered sleeping 1  1  Tired,  decreased energy 1  1  Change in appetite 2  0  Feeling bad or failure about yourself  0  0  Trouble concentrating 1  0  Moving slowly or fidgety/restless 0  0  Suicidal thoughts 0  0  PHQ-9 Score 7  2  Difficult doing work/chores Not difficult at all  Not difficult at all    Social History   Tobacco Use   Smoking status: Former    Current packs/day: 0.00    Average packs/day: 1 pack/day for 10.0 years (10.0 ttl pk-yrs)    Types: Cigarettes    Start date: 11/24/1974    Quit date: 11/23/1984    Years since quitting: 37.9   Smokeless tobacco: Former  Building services engineer status: Never Used  Substance Use Topics   Alcohol use: Yes    Alcohol/week: 10.0 standard drinks of alcohol    Types: 10 Glasses of wine per week    Comment: weekly   Drug use: No    Review of Systems Per HPI unless specifically indicated above     Objective:    BP 132/76   Pulse 69   Ht 5\' 1"  (1.549 m)   Wt 238 lb 9.6 oz (108.2 kg)   SpO2 97%   BMI 45.08 kg/m   Wt Readings from Last 3 Encounters:  10/11/22 238 lb 9.6 oz (108.2 kg)  01/12/22 229  lb (103.9 kg)  01/03/22 238 lb (108 kg)    Physical Exam Vitals and nursing note reviewed.  Constitutional:      General: She is not in acute distress.    Appearance: Normal appearance. She is well-developed. She is obese. She is not diaphoretic.     Comments: Well-appearing, comfortable, cooperative  HENT:     Head: Normocephalic and atraumatic.  Eyes:     General:        Right eye: No discharge.        Left eye: No discharge.     Conjunctiva/sclera: Conjunctivae normal.  Cardiovascular:     Rate and Rhythm: Normal rate.  Pulmonary:     Effort: Pulmonary effort is normal.  Skin:    General: Skin is warm and dry.     Findings: No erythema or rash.  Neurological:     Mental Status: She is alert and oriented to person, place, and time.  Psychiatric:        Mood and Affect: Mood normal.        Behavior: Behavior normal.        Thought  Content: Thought content normal.     Comments: Well groomed, good eye contact, normal speech and thoughts       Results for orders placed or performed in visit on 01/12/22  CBC with Differential/Platelet  Result Value Ref Range   WBC 5.6 3.4 - 10.8 x10E3/uL   RBC 4.59 3.77 - 5.28 x10E6/uL   Hemoglobin 13.6 11.1 - 15.9 g/dL   Hematocrit 40.9 81.1 - 46.6 %   MCV 92 79 - 97 fL   MCH 29.6 26.6 - 33.0 pg   MCHC 32.3 31.5 - 35.7 g/dL   RDW 91.4 78.2 - 95.6 %   Platelets 271 150 - 450 x10E3/uL   Neutrophils 56 Not Estab. %   Lymphs 27 Not Estab. %   Monocytes 13 Not Estab. %   Eos 3 Not Estab. %   Basos 1 Not Estab. %   Neutrophils Absolute 3.1 1.4 - 7.0 x10E3/uL   Lymphocytes Absolute 1.5 0.7 - 3.1 x10E3/uL   Monocytes Absolute 0.8 0.1 - 0.9 x10E3/uL   EOS (ABSOLUTE) 0.2 0.0 - 0.4 x10E3/uL   Basophils Absolute 0.0 0.0 - 0.2 x10E3/uL   Immature Granulocytes 0 Not Estab. %   Immature Grans (Abs) 0.0 0.0 - 0.1 x10E3/uL  Lipid panel  Result Value Ref Range   Cholesterol, Total 197 100 - 199 mg/dL   Triglycerides 213 0 - 149 mg/dL   HDL 50 >08 mg/dL   VLDL Cholesterol Cal 26 5 - 40 mg/dL   LDL Chol Calc (NIH) 657 (H) 0 - 99 mg/dL   Chol/HDL Ratio 3.9 0.0 - 4.4 ratio  Hemoglobin A1c  Result Value Ref Range   Hgb A1c MFr Bld 6.0 (H) 4.8 - 5.6 %   Est. average glucose Bld gHb Est-mCnc 126 mg/dL  Comprehensive metabolic panel  Result Value Ref Range   Glucose 110 (H) 70 - 99 mg/dL   BUN 14 8 - 27 mg/dL   Creatinine, Ser 8.46 (H) 0.57 - 1.00 mg/dL   eGFR 59 (L) >96 EX/BMW/4.13   BUN/Creatinine Ratio 13 12 - 28   Sodium 135 134 - 144 mmol/L   Potassium 5.0 3.5 - 5.2 mmol/L   Chloride 99 96 - 106 mmol/L   CO2 23 20 - 29 mmol/L   Calcium 9.6 8.7 - 10.3 mg/dL   Total Protein 6.3 6.0 - 8.5  g/dL   Albumin 4.3 3.9 - 4.9 g/dL   Globulin, Total 2.0 1.5 - 4.5 g/dL   Albumin/Globulin Ratio 2.2 1.2 - 2.2   Bilirubin Total 0.3 0.0 - 1.2 mg/dL   Alkaline Phosphatase 110 44 - 121 IU/L    AST 18 0 - 40 IU/L   ALT 20 0 - 32 IU/L  TSH  Result Value Ref Range   TSH 3.740 0.450 - 4.500 uIU/mL  Vitamin B12  Result Value Ref Range   Vitamin B-12 342 232 - 1,245 pg/mL  VITAMIN D 25 Hydroxy (Vit-D Deficiency, Fractures)  Result Value Ref Range   Vit D, 25-Hydroxy 43.5 30.0 - 100.0 ng/mL      Assessment & Plan:   Problem List Items Addressed This Visit     Benign hypertension with CKD (chronic kidney disease) stage III - Primary    Well-controlled HTN - Home BP readings not checking  CKD-III elevated creatinine on last check, trending up - likely with sodas and NSAIDs    Plan:  1. Continue current BP regimen - Lisinopril 5mg  daily 2. Encourage improved lifestyle - low sodium diet, regular exercise 3. May consider monitor BP outside office, bring readings to next visit, if persistently >140/90 or new symptoms notify office sooner      Chronic kidney disease (CKD), stage III (moderate) (HCC)   Insomnia    Secondary to OSA, mood, pain On CPAP per Pulm/Sleep specialist      Major depressive disorder, recurrent, in partial remission (HCC)   Morbid obesity with body mass index (BMI) of 45.0 to 49.9 in adult [facility])    Review medication management options per AVS, she will follow up pending decision      OSA on CPAP    Well controlled, chronic OSA on CPAP - Continue current CPAP therapy, patient seems to be benefiting from therapy (still goal to improve weight)      Pre-diabetes   Other Visit Diagnoses     Need for influenza vaccination       Relevant Orders   Flu vaccine trivalent PF, 6mos and older(Flulaval,Afluria,Fluarix,Fluzone)       Orders Placed This Encounter  Procedures   Flu vaccine trivalent PF, 6mos and older(Flulaval,Afluria,Fluarix,Fluzone)    No orders of the defined types were placed in this encounter.     Follow up plan: Return in about 3 months (around 01/10/2023) for 3 month Annual Physical ([facility] orders).    Saralyn Pilar, DO [facility] 10/11/2022, 10:53 AM

## 2022-10-12 NOTE — Assessment & Plan Note (Signed)
Review medication management options per AVS, she will follow up pending decision

## 2022-10-12 NOTE — Assessment & Plan Note (Signed)
Well-controlled HTN - Home BP readings not checking  CKD-III elevated creatinine on last check, trending up - likely with sodas and NSAIDs    Plan:  1. Continue current BP regimen - Lisinopril 5mg  daily 2. Encourage improved lifestyle - low sodium diet, regular exercise 3. May consider monitor BP outside office, bring readings to next visit, if persistently >140/90 or new symptoms notify office sooner

## 2022-10-12 NOTE — Assessment & Plan Note (Signed)
Well controlled, chronic OSA on CPAP - Continue current CPAP therapy, patient seems to be benefiting from therapy (still goal to improve weight)

## 2022-10-12 NOTE — Assessment & Plan Note (Signed)
Secondary to OSA, mood, pain On CPAP per Pulm/Sleep specialist

## 2022-10-17 ENCOUNTER — Other Ambulatory Visit: Payer: Self-pay | Admitting: Family Medicine

## 2022-10-17 DIAGNOSIS — F5105 Insomnia due to other mental disorder: Secondary | ICD-10-CM

## 2022-10-24 ENCOUNTER — Telehealth: Payer: Self-pay | Admitting: Family Medicine

## 2022-10-24 ENCOUNTER — Other Ambulatory Visit: Payer: Self-pay | Admitting: Family Medicine

## 2022-10-24 DIAGNOSIS — F5105 Insomnia due to other mental disorder: Secondary | ICD-10-CM

## 2022-10-24 NOTE — Telephone Encounter (Signed)
Patient requesting a 90 day supply

## 2022-10-24 NOTE — Telephone Encounter (Signed)
Medication Refill - Medication:  sertraline (ZOLOFT) 100 MG tablet  Has the patient contacted their pharmacy? Yes.   Pt states she needs a 90 day and not 30.  Only 30 sent in and pharmacy will not fill the Rx Preferred Pharmacy (with phone number or street name):  [id number] - [place], [place] - [address] AT [facility] OF [address]  Fill the Rx Has the patient been seen for an appointment in the last year OR does the patient have an upcoming appointment? Yes.    Agent: Please be advised that RX refills may take up to 3 business days. We ask that you follow-up with your pharmacy.

## 2022-10-25 NOTE — Telephone Encounter (Signed)
Requested Prescriptions  Pending Prescriptions Disp Refills   sertraline (ZOLOFT) 100 MG tablet [Pharmacy Med Name: SERTRALINE 100MG  TABLETS] 30 tablet 0    Sig: TAKE 1 TABLET BY MOUTH DAILY     Psychiatry:  Antidepressants - SSRI - sertraline Passed - 10/24/2022  9:02 AM      Passed - AST in normal range and within 360 days    AST  Date Value Ref Range Status  01/14/2022 18 0 - 40 IU/L Final         Passed - ALT in normal range and within 360 days    ALT  Date Value Ref Range Status  01/14/2022 20 0 - 32 IU/L Final         Passed - Completed PHQ-2 or PHQ-9 in the last 360 days      Passed - Valid encounter within last 6 months    Recent Outpatient Visits           2 weeks ago Benign hypertension with CKD (chronic kidney disease) stage III   [place] [facility] [place], Netta Neat, DO   9 months ago Annual physical exam   [place] [facility] Smitty Cords, DO   9 months ago COVID-19 virus infection   [place] [facility] [place], Netta Neat, DO   1 year ago Acute pain of right knee   [facility] Smitty Cords, DO   1 year ago Annual physical exam   [place] [facility] Smitty Cords, DO       Future Appointments             In 2 months Althea Charon, Netta Neat, DO [place] [facility], [facility] [place]

## 2022-11-03 ENCOUNTER — Telehealth: Payer: Self-pay | Admitting: Family Medicine

## 2022-11-03 ENCOUNTER — Other Ambulatory Visit: Payer: Self-pay | Admitting: Family Medicine

## 2022-11-03 DIAGNOSIS — F331 Major depressive disorder, recurrent, moderate: Secondary | ICD-10-CM

## 2022-11-03 DIAGNOSIS — E782 Mixed hyperlipidemia: Secondary | ICD-10-CM

## 2022-11-03 MED ORDER — ATORVASTATIN CALCIUM 20 MG PO TABS
20.0000 mg | ORAL_TABLET | Freq: Every day | ORAL | 1 refills | Status: DC
Start: 2022-11-03 — End: 2023-01-11

## 2022-11-03 NOTE — Telephone Encounter (Signed)
Medication Refill - Medication: atorvastatin (LIPITOR) 20 MG tablet   Has the patient contacted their pharmacy? Yes.   (Agent: If no, request that the patient contact the pharmacy for the refill. If patient does not wish to contact the pharmacy document the reason why and proceed with request.) (Agent: If yes, when and what did the pharmacy advise?)  Preferred Pharmacy (with phone number or street name):  [facility] #[id number] [place], [place] - [address] AT [facility] OF [address] [facility]  [address] [place] [address]  Phone: [phone] Fax: [phone]   Has the patient been seen for an appointment in the last year OR does the patient have an upcoming appointment? Yes.    Agent: Please be advised that RX refills may take up to 3 business days. We ask that you follow-up with your pharmacy.

## 2022-11-07 ENCOUNTER — Other Ambulatory Visit: Payer: Self-pay | Admitting: Family Medicine

## 2022-11-07 DIAGNOSIS — F331 Major depressive disorder, recurrent, moderate: Secondary | ICD-10-CM

## 2022-11-08 NOTE — Telephone Encounter (Signed)
Requested Prescriptions  Pending Prescriptions Disp Refills   buPROPion (WELLBUTRIN XL) 300 MG 24 hr tablet [Pharmacy Med Name: BUPROPION XL 300MG  TABLETS] 90 tablet 0    Sig: TAKE 1 TABLET BY MOUTH DAILY     Psychiatry: Antidepressants - bupropion Failed - 11/07/2022  8:16 AM      Failed - Cr in normal range and within 360 days    Creatinine, Ser  Date Value Ref Range Status  01/14/2022 1.07 (H) 0.57 - 1.00 mg/dL Final         Passed - AST in normal range and within 360 days    AST  Date Value Ref Range Status  01/14/2022 18 0 - 40 IU/L Final         Passed - ALT in normal range and within 360 days    ALT  Date Value Ref Range Status  01/14/2022 20 0 - 32 IU/L Final         Passed - Completed PHQ-2 or PHQ-9 in the last 360 days      Passed - Last BP in normal range    BP Readings from Last 1 Encounters:  10/11/22 132/76         Passed - Valid encounter within last 6 months    Recent Outpatient Visits           4 weeks ago Benign hypertension with CKD (chronic kidney disease) stage III   [place] [facility] Smitty Cords, DO   10 months ago Annual physical exam   [place] [facility] Smitty Cords, DO   10 months ago COVID-19 virus infection   [facility] [place], Netta Neat, DO   1 year ago Acute pain of right knee   [facility] Smitty Cords, DO   1 year ago Annual physical exam   [facility] Smitty Cords, DO       Future Appointments             In 2 months Althea Charon, Netta Neat, DO [place] [facility], [facility]

## 2022-11-08 NOTE — Telephone Encounter (Addendum)
Patient states the last refilled she picked up was on  6/24 and she has a week left of medication. She was advised by pharmacy to call PCP office. Informed patient please allow 48-72 hour turn around time     [facility]ORE #[id number] - [place], [place] - [address] AT [facility] OF [address] [facility] Phone: [phone]  Fax: [phone]

## 2022-11-15 ENCOUNTER — Other Ambulatory Visit: Payer: Self-pay | Admitting: Family Medicine

## 2022-11-15 DIAGNOSIS — F331 Major depressive disorder, recurrent, moderate: Secondary | ICD-10-CM

## 2022-11-15 NOTE — Telephone Encounter (Signed)
Request is too soon, last refill 10/04/22 for 90 days.  Requested Prescriptions  Pending Prescriptions Disp Refills   buPROPion (WELLBUTRIN XL) 300 MG 24 hr tablet 90 tablet 0    Sig: Take 1 tablet (300 mg total) by mouth daily.     Psychiatry: Antidepressants - bupropion Failed - 11/15/2022 10:04 AM      Failed - Cr in normal range and within 360 days    Creatinine, Ser  Date Value Ref Range Status  01/14/2022 1.07 (H) 0.57 - 1.00 mg/dL Final         Passed - AST in normal range and within 360 days    AST  Date Value Ref Range Status  01/14/2022 18 0 - 40 IU/L Final         Passed - ALT in normal range and within 360 days    ALT  Date Value Ref Range Status  01/14/2022 20 0 - 32 IU/L Final         Passed - Completed PHQ-2 or PHQ-9 in the last 360 days      Passed - Last BP in normal range    BP Readings from Last 1 Encounters:  10/11/22 132/76         Passed - Valid encounter within last 6 months    Recent Outpatient Visits           1 month ago Benign hypertension with CKD (chronic kidney disease) stage III   [facility] [place], Netta Neat, DO   10 months ago Annual physical exam   [facility] Smitty Cords, DO   10 months ago COVID-19 virus infection   [facility], Netta Neat, DO   1 year ago Acute pain of right knee   [facility] Smitty Cords, DO   1 year ago Annual physical exam   [facility] Smitty Cords, DO       Future Appointments             In 1 month Althea Charon, Netta Neat, DO [facility], [facility]

## 2022-11-15 NOTE — Telephone Encounter (Signed)
Medication Refill - Medication: buPROPion (WELLBUTRIN XL) 300 MG 24 hr tablet [Pharmacy Med Name: BUPROPION XL 300MG  TABLETS]   ( only have 3 days left)   Has the patient contacted their pharmacy? Yes.     Preferred Pharmacy (with phone number or street name):  [facility] #[id number] [place], [place] - [address] AT [facility] OF [address] [facility]  [address] [place], [place] [address]  Phone:  [phone]  Fax:  [phone]  DEA #:  [id number]     Has the patient been seen for an appointment in the last year OR does the patient have an upcoming appointment? Yes.    Agent: Please be advised that RX refills may take up to 3 business days. We ask that you follow-up with your pharmacy.

## 2022-11-16 ENCOUNTER — Ambulatory Visit: Payer: Self-pay | Admitting: *Deleted

## 2022-11-16 DIAGNOSIS — F331 Major depressive disorder, recurrent, moderate: Secondary | ICD-10-CM

## 2022-11-16 MED ORDER — BUPROPION HCL ER (XL) 300 MG PO TB24
300.0000 mg | ORAL_TABLET | Freq: Every day | ORAL | 3 refills | Status: DC
Start: 2022-11-16 — End: 2023-11-01

## 2022-11-16 NOTE — Telephone Encounter (Signed)
I spoke to Azucena Kuba, Pharmacologist at [facility].  He states they never received the prescription on 10/04/22. He states the last one they received was in June 2024.  Prescription pended. Please resubmit if appropriate.

## 2022-11-16 NOTE — Telephone Encounter (Signed)
  Chief Complaint: Needing wellbutrin refill but it's being denied because it's being requested too soon.   She got her last refill on July 25, 2022 but the pharmacy is saying the last refill was 10/04/2022 for 90 days so she is requesting too soon.    Symptoms: Pt. Is upset that she is having trouble getting this refilled.   Pharmacy and office are telling her different things.    Frequency: Only has 2 days left then will be out. Pertinent Negatives: Patient denies picking up an rx for Wellbutrin in Sept. 3. Disposition: [] ED /[] Urgent Care (no appt availability in office) / [] Appointment(In office/virtual)/ []  [place] [facility]/ [] Home Care/ [] Refused Recommended Disposition /[] [place] [facility]/ [x]  Follow-up with PCP Additional Notes: Confirmed the pharmacy was correct which it is.   She is needing a refill of the Wellbutrin.   Can Dr. Althea Charon please figure this out for her and send in a refill.    I let her know we would see what we could do to get this straightened out for her.   She thanked me for my help but was frustrated with the whole problem.   "If I can't get it refilled then I'll just stop taking it, I guess".

## 2022-11-16 NOTE — Addendum Note (Signed)
Addended by: Smitty Cords on: 11/16/2022 04:05 PM   Modules accepted: Orders

## 2022-11-16 NOTE — Telephone Encounter (Signed)
Reason for Disposition  [1] Caller has URGENT medicine question about med that PCP or specialist prescribed AND [2] triager unable to answer question    She is not requesting the Wellbutrin too soon because the last time she got it was July 25, 2022 not in Sept. Like the pharmacy is saying.  Answer Assessment - Initial Assessment Questions 1. NAME of MEDICINE: "What medicine(s) are you calling about?"     Wellbutrin 2. QUESTION: "What is your question?" (e.g., double dose of medicine, side effect)     I'm requesting my Wellbutrin refill.   I got it in June 24.   That's the last time I have picked up Wellbutrin.   The pharmacy says I got a refill on Sept 3, 2024 but I did not pick up any Wellbutrin in Sept.     I've not gotten any rx since June.   The pharmacy calls me when it's time for a refill.   They said they don't have any refills for me when I called them.    I only have 2 days left of pills.       Then the pharmacy said it was denied.    Jerrye Noble said it was refilled in Sept.    It's being denied because I'm requesting it to soon. This is so aggravating.   I don't know what the problem is.   I confirmed with her it was [facility] and she said,  "Yes".    I never got Wellbutrin in Sept.  3. PRESCRIBER: "Who prescribed the medicine?" Reason: if prescribed by specialist, call should be referred to that group.     Dr. Althea Charon.   4. SYMPTOMS: "Do you have any symptoms?" If Yes, ask: "What symptoms are you having?"  "How bad are the symptoms (e.g., mild, moderate, severe)     Upset that she only has 2 days of pills left and can't get this refilled. 5. PREGNANCY:  "Is there any chance that you are pregnant?" "When was your last menstrual period?"     N/A  Protocols used: Medication Question Call-A-AH

## 2022-11-16 NOTE — Telephone Encounter (Signed)
Signed order to refill Wellbutrin XL 300

## 2022-11-16 NOTE — Addendum Note (Signed)
Addended by: Shirley Muscat on: 11/16/2022 02:39 PM   Modules accepted: Orders

## 2022-11-17 ENCOUNTER — Other Ambulatory Visit: Payer: Self-pay | Admitting: Family Medicine

## 2022-11-17 DIAGNOSIS — I10 Essential (primary) hypertension: Secondary | ICD-10-CM

## 2022-11-17 DIAGNOSIS — M797 Fibromyalgia: Secondary | ICD-10-CM

## 2022-11-18 NOTE — Telephone Encounter (Signed)
Requested Prescriptions  Pending Prescriptions Disp Refills   gabapentin (NEURONTIN) 600 MG tablet [Pharmacy Med Name: GABAPENTIN 600MG  TABLETS] 270 tablet 0    Sig: TAKE 1 AND 1/2 TABLETS(900 MG) BY MOUTH TWICE DAILY     Neurology: Anticonvulsants - gabapentin Failed - 11/17/2022  6:56 PM      Failed - Cr in normal range and within 360 days    Creatinine, Ser  Date Value Ref Range Status  01/14/2022 1.07 (H) 0.57 - 1.00 mg/dL Final         Passed - Completed PHQ-2 or PHQ-9 in the last 360 days      Passed - Valid encounter within last 12 months    Recent Outpatient Visits           1 month ago Benign hypertension with CKD (chronic kidney disease) stage III   [place] [facility], Netta Neat, DO   10 months ago Annual physical exam   [place] [facility] Smitty Cords, DO   10 months ago COVID-19 virus infection   [facility] [place], Netta Neat, DO   1 year ago Acute pain of right knee   [facility] Smitty Cords, DO   1 year ago Annual physical exam   [place] [facility] Smitty Cords, DO       Future Appointments             In 1 month Althea Charon, Netta Neat, DO [facility], [facility]             lisinopril (ZESTRIL) 5 MG tablet [Pharmacy Med Name: LISINOPRIL 5MG  TABLETS] 90 tablet 0    Sig: TAKE 1 TABLET(5 MG) BY MOUTH DAILY     Cardiovascular:  ACE Inhibitors Failed - 11/17/2022  6:56 PM      Failed - Cr in normal range and within 180 days    Creatinine, Ser  Date Value Ref Range Status  01/14/2022 1.07 (H) 0.57 - 1.00 mg/dL Final         Failed - K in normal range and within 180 days    Potassium  Date Value Ref Range Status  01/14/2022 5.0 3.5 - 5.2 mmol/L Final         Passed - Patient is not pregnant      Passed - Last BP in normal range    BP Readings from  Last 1 Encounters:  10/11/22 132/76         Passed - Valid encounter within last 6 months    Recent Outpatient Visits           1 month ago Benign hypertension with CKD (chronic kidney disease) stage III   [place] [facility] Smitty Cords, DO   10 months ago Annual physical exam   [place] Smitty Cords, DO   10 months ago COVID-19 virus infection   [place] [facility], Netta Neat, DO   1 year ago Acute pain of right knee   [facility] Smitty Cords, DO   1 year ago Annual physical exam   [place] [facility] Smitty Cords, DO       Future Appointments             In 1 month Althea Charon, Netta Neat, DO  [place], [place]

## 2022-12-21 LAB — CBC WITH DIFFERENTIAL/PLATELET
Basophils Absolute: 0.1 10*3/uL (ref 0.0–0.2)
Basos: 1 %
EOS (ABSOLUTE): 0.2 10*3/uL (ref 0.0–0.4)
Eos: 4 %
Hematocrit: 42 % (ref 34.0–46.6)
Hemoglobin: 13.9 g/dL (ref 11.1–15.9)
Immature Grans (Abs): 0 10*3/uL (ref 0.0–0.1)
Immature Granulocytes: 0 %
Lymphocytes Absolute: 1.6 10*3/uL (ref 0.7–3.1)
Lymphs: 28 %
MCH: 30.6 pg (ref 26.6–33.0)
MCHC: 33.1 g/dL (ref 31.5–35.7)
MCV: 93 fL (ref 79–97)
Monocytes Absolute: 0.5 10*3/uL (ref 0.1–0.9)
Monocytes: 8 %
Neutrophils Absolute: 3.5 10*3/uL (ref 1.4–7.0)
Neutrophils: 59 %
Platelets: 291 10*3/uL (ref 150–450)
RBC: 4.54 x10E6/uL (ref 3.77–5.28)
RDW: 13.6 % (ref 11.7–15.4)
WBC: 5.9 10*3/uL (ref 3.4–10.8)

## 2022-12-21 LAB — LIPID PANEL
Chol/HDL Ratio: 4.3 {ratio} (ref 0.0–4.4)
Cholesterol, Total: 249 mg/dL — ABNORMAL HIGH (ref 100–199)
HDL: 58 mg/dL (ref >39)
LDL Chol Calc (NIH): 144 mg/dL — ABNORMAL HIGH (ref 0–99)
Triglycerides: 261 mg/dL — ABNORMAL HIGH (ref 0–149)
VLDL Cholesterol Cal: 47 mg/dL — ABNORMAL HIGH (ref 5–40)

## 2022-12-21 LAB — COMPREHENSIVE METABOLIC PANEL
ALT: 23 [IU]/L (ref 0–32)
AST: 19 [IU]/L (ref 0–40)
Albumin: 4.4 g/dL (ref 3.9–4.9)
Alkaline Phosphatase: 95 [IU]/L (ref 44–121)
BUN/Creatinine Ratio: 14 (ref 12–28)
BUN: 16 mg/dL (ref 8–27)
Bilirubin Total: 0.4 mg/dL (ref 0.0–1.2)
CO2: 22 mmol/L (ref 20–29)
Calcium: 9.8 mg/dL (ref 8.7–10.3)
Chloride: 100 mmol/L (ref 96–106)
Creatinine, Ser: 1.12 mg/dL — ABNORMAL HIGH (ref 0.57–1.00)
Globulin, Total: 2.2 g/dL (ref 1.5–4.5)
Glucose: 101 mg/dL — ABNORMAL HIGH (ref 70–99)
Potassium: 4.5 mmol/L (ref 3.5–5.2)
Sodium: 136 mmol/L (ref 134–144)
Total Protein: 6.6 g/dL (ref 6.0–8.5)
eGFR: 55 mL/min/{1.73_m2} — ABNORMAL LOW (ref >59)

## 2022-12-21 LAB — TSH: TSH: 3.73 u[IU]/mL (ref 0.450–4.500)

## 2022-12-21 LAB — VITAMIN D 25 HYDROXY (VIT D DEFICIENCY, FRACTURES): Vit D, 25-Hydroxy: 43.1 ng/mL (ref 30.0–100.0)

## 2022-12-21 LAB — HEMOGLOBIN A1C
Est. average glucose Bld gHb Est-mCnc: 123 mg/dL
Hgb A1c MFr Bld: 5.9 % — ABNORMAL HIGH (ref 4.8–5.6)

## 2022-12-21 LAB — VITAMIN B12: Vitamin B-12: 216 pg/mL — ABNORMAL LOW (ref 232–1245)

## 2023-01-11 ENCOUNTER — Ambulatory Visit: Payer: Managed Care, Other (non HMO) | Admitting: Family Medicine

## 2023-01-11 ENCOUNTER — Encounter: Payer: Self-pay | Admitting: Family Medicine

## 2023-01-11 VITALS — BP 124/80 | HR 68 | Ht 61.0 in | Wt 242.0 lb

## 2023-01-11 DIAGNOSIS — Z8249 Family history of ischemic heart disease and other diseases of the circulatory system: Secondary | ICD-10-CM

## 2023-01-11 DIAGNOSIS — F5105 Insomnia due to other mental disorder: Secondary | ICD-10-CM

## 2023-01-11 DIAGNOSIS — F331 Major depressive disorder, recurrent, moderate: Secondary | ICD-10-CM | POA: Diagnosis not present

## 2023-01-11 DIAGNOSIS — G4733 Obstructive sleep apnea (adult) (pediatric): Secondary | ICD-10-CM

## 2023-01-11 DIAGNOSIS — N183 Chronic kidney disease, stage 3 unspecified: Secondary | ICD-10-CM

## 2023-01-11 DIAGNOSIS — E782 Mixed hyperlipidemia: Secondary | ICD-10-CM

## 2023-01-11 DIAGNOSIS — I129 Hypertensive chronic kidney disease with stage 1 through stage 4 chronic kidney disease, or unspecified chronic kidney disease: Secondary | ICD-10-CM

## 2023-01-11 DIAGNOSIS — Z6841 Body Mass Index (BMI) 40.0 and over, adult: Secondary | ICD-10-CM

## 2023-01-11 DIAGNOSIS — Z0000 Encounter for general adult medical examination without abnormal findings: Secondary | ICD-10-CM

## 2023-01-11 DIAGNOSIS — F99 Mental disorder, not otherwise specified: Secondary | ICD-10-CM

## 2023-01-11 DIAGNOSIS — M797 Fibromyalgia: Secondary | ICD-10-CM

## 2023-01-11 DIAGNOSIS — Z1231 Encounter for screening mammogram for malignant neoplasm of breast: Secondary | ICD-10-CM

## 2023-01-11 DIAGNOSIS — I10 Essential (primary) hypertension: Secondary | ICD-10-CM

## 2023-01-11 MED ORDER — ATORVASTATIN CALCIUM 20 MG PO TABS: 20.0000 mg | ORAL_TABLET | Freq: Every day | ORAL | 3 refills | Status: DC

## 2023-01-11 MED ORDER — GABAPENTIN 600 MG PO TABS: 900.0000 mg | ORAL_TABLET | Freq: Two times a day (BID) | ORAL | 3 refills | Status: DC

## 2023-01-11 MED ORDER — LISINOPRIL 5 MG PO TABS: 5.0000 mg | ORAL_TABLET | Freq: Every day | ORAL | 3 refills | Status: DC

## 2023-01-11 NOTE — Patient Instructions (Addendum)
Thank you for coming to the office today.  Let me know in future if when ready to pursue other options for the chronic pain  Keep on the oral B12, we can consider injections if when ready.  You have been referred for a Coronary Calcium Score Cardiac CT Scan. This is a screening test for patients aged 63-50+ with cardiovascular risk factors or who are healthy but would be interested in Cardiovascular Screening for heart disease. Even if there is a family history of heart disease, this imaging can be useful. Typically it can be done every 5+ years or at a different timeline we agree on  The scan will look at the chest and mainly focus on the heart and identify early signs of calcium build up or blockages within the heart arteries. It is not 100% accurate for identifying blockages or heart disease, but it is useful to help [place] predict who may have some early changes or be at risk in the future for a heart attack or cardiovascular problem.  The results are reviewed by a Cardiologist and they will document the results. It should become available on MyChart. Typically the results are divided into percentiles based on other patients of the same demographic and age. So it will compare your risk to others similar to you. If you have a higher score >99 or higher percentile >75%tile, it is recommended to consider Statin cholesterol therapy and or referral to Cardiologist. I will try to help explain your results and if we have questions we can contact the Cardiologist.  You will be contacted for scheduling. Usually it is done at any imaging facility through [facility], [facility] or [facility].  The cost is $99 flat fee total and it does not go through insurance, so no authorization is required.    For Weight Loss / Obesity only  Contrave - oral medication, appetite suppression has wellbutrin/bupropion and naltrexone in it and it can also help with appetite, it is ordered  through a speciality pharmacy. - $99 per month  Free sample 7 day, 1 pill per day for 1 week  Alternative options  Semaglutide injection (mixed Ozempic) from [facility] 0.25mg  weekly for 4 weeks then increase to 0.5mg  weekly It comes in a vial and a needle syringe, you need to draw up the shot and self admin it weekly Cost is about $200 per month Call them to check pricing and availability  [facility] Address: [address] [place], [place] [address] Phone: [phone]  --------------------------------  Doctors office in [place], does not accept insurance. Call to schedule / Walk in to schedule They can do the weekly weight loss injections (same as Ozempic) Pay per visit and per shot. It may be approximately $60-75+ per visit/shot, approximately $200-300 range per month depending  [facility] Address: [address], [place], [place] [address] Phone: [phone]  [facility] Online Virtual Doctor Team With insurance $79/mo + copay for medications Without insurance $79/mo + $99/mo to include medication (Semaglutide) Without insurance $79/mo + $199/mo to include medication (Tirzepatide) [email or web address]   Please schedule a Follow-up Appointment to: Return in about 6 months (around 07/12/2023) for 6 month PreDM A1c, B12, Fibro/Pain updates.  If you have any other questions or concerns, please feel free to call the office or send a message through MyChart. You may also schedule an earlier appointment if necessary.  Additionally, you may be receiving a survey about your experience  at our office within a few days to 1 week by e-mail or mail. We value your feedback.  Saralyn Pilar, DO [facility], [place]

## 2023-01-11 NOTE — Progress Notes (Signed)
Subjective:    Patient ID: Rachel Townsend, female    DOB: 1960/11/13, 62 y.o.   MRN: [id number]  Rachel Townsend is a 62 y.o. female presenting on 01/11/2023 for Medical Management of Chronic Issues (Follow up)   HPI  Discussed the use of AI scribe software for clinical note transcription with the patient, who gave verbal consent to proceed.        Lifestyle / Morbid Obesity BMI >45   CHRONIC HTN / CKD-III Reports no concerns, not checking BP at home. Cr 1.1 range, stable Current Meds - Lisinopril 5mg    Limited to 1 soda per day, plenty of water Reports good compliance, took meds today. Tolerating well, w/o complaints.    FOLLOW-UP Insomnia / OSA on CPAP Continued treat with CPAP for OSA Improved sleep with CPAP. She adheres to machine and uses it nightly avg >8 hours nightly - No new concerns or symptoms - Insomnia is improved now   FOLLOW-UP DEPRESSION / FIBROMYALGIA: See prior notes for background information. - Today patient reports no new concerns. Mood remains improved much better - Still has generalized pain from fibromyalgia, but seems improved with better sleep - Continues Sertraline and Wellbutrin - Takes Gabapentin 900mg  (1.5 tabs 600mg ) BID No longer taking NSAID Etolodac   Vimtain B12 Admits fatigue 3 weeks ago lab showed mild low 216. She is now taking Super B Complex since that time.  Action Tremors Worse with fine motor. No significant concern. Worse if tired or fine motion with sewing.  HYPERLIPIDEMIA: - Reports no concerns. Last lipid panel 01/2023, LDL 140s - Currently taking Atorvastatin 20mg , tolerating well without side effects or myalgias    Health Maintenance:  Mammogram last done 01/2022, next due 02/2023, order in [facility] [place]  Vaccines updated  Last Colonoscopy 02/20/2020 Dr Allegra Lai AGI - repeat 7 years, 2029     01/11/2023   10:31 AM 10/11/2022   10:40 AM 02/04/2022    9:43 AM  Depression screen PHQ 2/9  Decreased Interest 0 1 0   Down, Depressed, Hopeless  1 1  PHQ - 2 Score 0 2 1  Altered sleeping 0 1   Tired, decreased energy 2 1   Change in appetite 3 2   Feeling bad or failure about yourself  0 0   Trouble concentrating 0 1   Moving slowly or fidgety/restless 0 0   Suicidal thoughts 0 0   PHQ-9 Score 5 7   Difficult doing work/chores  Not difficult at all        01/11/2023   10:32 AM 10/11/2022   10:40 AM 11/20/2020    2:10 PM 11/18/2019    2:07 PM  GAD 7 : Generalized Anxiety Score  Nervous, Anxious, on Edge 0 1 0 1  Control/stop worrying 0 1 0 1  Worry too much - different things 0 1 0 1  Trouble relaxing 0 1 0 1  Restless 0 1 0 0  Easily annoyed or irritable 0 1 0 0  Afraid - awful might happen 0 1 0 0  Total GAD 7 Score 0 7 0 4  Anxiety Difficulty   Not difficult at all Not difficult at all     Past Medical History:  Diagnosis Date   Allergy    Family history of adverse reaction to anesthesia    sister- PONV   Fibromyalgia    GERD (gastroesophageal reflux disease)    Hypertension    Pre-diabetes    Sleep apnea  uses CPAP   Stage III chronic kidney disease (HCC)    Wrist pain    Past Surgical History:  Procedure Laterality Date   BIOPSY  02/20/2020   Procedure: BIOPSY;  Surgeon: Toney Reil, MD;  Location: [facility];  Service: Endoscopy;;   BREAST BIOPSY Left    core- neg   BREAST BIOPSY Left    stereo- neg   COLONOSCOPY WITH PROPOFOL N/A 02/20/2020   Procedure: COLONOSCOPY WITH PROPOFOL;  Surgeon: Toney Reil, MD;  Location: [facility];  Service: Endoscopy;  Laterality: N/A;   ECTOPIC PREGNANCY SURGERY     FOOT SURGERY Left    HARDWARE REMOVAL Right 12/01/2020   Procedure: HARDWARE REMOVAL (WRIST);  Surgeon: Deeann Saint, MD;  Location: [facility];  Service: Orthopedics;  Laterality: Right;   ORIF RADIAL FRACTURE Right 02/18/2019   Procedure: OPEN REDUCTION INTERNAL FIXATION (ORIF) RADIAL FRACTURE;  Surgeon: Deeann Saint, MD;   Location: [facility];  Service: Orthopedics;  Laterality: Right;   POLYPECTOMY  02/20/2020   Procedure: POLYPECTOMY;  Surgeon: Toney Reil, MD;  Location: [facility];  Service: Endoscopy;;   WISDOM TOOTH EXTRACTION     Social History   Socioeconomic History   Marital status: Married    Spouse name: Not on file   Number of children: Not on file   Years of education: Not on file   Highest education level: Some college, no degree  Occupational History   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[employer])  Tobacco Use   Smoking status: Former    Current packs/day: 0.00    Average packs/day: 1 pack/day for 10.0 years (10.0 ttl pk-yrs)    Types: Cigarettes    Start date: 11/24/1974    Quit date: 11/23/1984    Years since quitting: 38.1   Smokeless tobacco: Former  Building services engineer status: Never Used  Substance and Sexual Activity   Alcohol use: Yes    Alcohol/week: 10.0 standard drinks of alcohol    Types: 10 Glasses of wine per week    Comment: weekly   Drug use: No   Sexual activity: Yes    Birth control/protection: Injection  Other Topics Concern   Not on file  Social History Narrative   Not on file   Social Determinants of Health   Financial Resource Strain: Low Risk  (01/07/2023)   Overall Financial Resource Strain (CARDIA)    Difficulty of Paying Living Expenses: Not very hard  Food Insecurity: No Food Insecurity (01/07/2023)   Hunger Vital Sign    Worried About Running Out of Food in the Last Year: Never true    Ran Out of Food in the Last Year: Never true  Transportation Needs: No Transportation Needs (01/07/2023)   PRAPARE - Administrator, Civil Service (Medical): No    Lack of Transportation (Non-Medical): No  Physical Activity: Unknown (01/07/2023)   Exercise Vital Sign    Days of Exercise per Week: 0 days    Minutes of Exercise per Session: Not on file  Stress: Stress Concern Present (01/07/2023)   [employer] of Occupational Health -  Occupational Stress Questionnaire    Feeling of Stress : To some extent  Social Connections: Moderately Integrated (01/07/2023)   Social Connection and Isolation Panel [NHANES]    Frequency of Communication with Friends and Family: Once a week    Frequency of Social Gatherings with Friends and Family: Once a week    Attends Religious Services: More than 4 times  per year    Active Member of Clubs or Organizations: Yes    Attends Engineer, structural: More than 4 times per year    Marital Status: Married  Catering manager Violence: Not on file   Family History  Problem Relation Age of Onset   Hypertension Mother    Diabetes Mother    Hyperlipidemia Mother    Cancer Mother    Breast cancer Mother 57   Hypertension Father    Hyperlipidemia Father    Breast cancer Sister    Current Outpatient Medications on File Prior to Visit  Medication Sig   acetaminophen (TYLENOL) 650 MG CR tablet Take 1,300 mg by mouth 2 (two) times daily as needed for pain.   Alum Hydroxide-Mag Carbonate (GAVISCON EXTRA STRENGTH) 160-105 MG CHEW Chew 2 tablets by mouth daily as needed (heartburn).   Artificial Saliva (DRY MOUTH DROPS) LOZG Use as directed 1 lozenge in the mouth or throat daily as needed (dry mouth).   B Complex-C (B-COMPLEX WITH VITAMIN C) tablet Take 1 tablet by mouth daily.   BIOTIN PO Take 1 Capful by mouth daily.   buPROPion (WELLBUTRIN XL) 300 MG 24 hr tablet Take 1 tablet (300 mg total) by mouth daily.   Cholecalciferol (DIALYVITE VITAMIN D 5000) 125 MCG (5000 UT) capsule Take 5,000 Units by mouth daily.   Coenzyme Q10 (COQ10) 100 MG CAPS Take 100 mg by mouth daily.   loratadine (CLARITIN) 10 MG tablet Take 10 mg by mouth every morning.   Magnesium Bisglycinate (MAG GLYCINATE PO) Take 2 tablets by mouth 2 (two) times daily.   niacin (VITAMIN B3) 500 MG tablet Take 500 mg by mouth at bedtime.   sertraline (ZOLOFT) 100 MG tablet TAKE 1 TABLET BY MOUTH DAILY   [DISCONTINUED] Icosapent  Ethyl (VASCEPA) 1 g CAPS Take 2 capsules (2 g total) by mouth 2 (two) times daily.   No current facility-administered medications on file prior to visit.    Review of Systems Per HPI unless specifically indicated above     Objective:    BP 124/80   Pulse 68   Ht 5\' 1"  (1.549 m)   Wt 242 lb (109.8 kg)   BMI 45.73 kg/m   Wt Readings from Last 3 Encounters:  01/11/23 242 lb (109.8 kg)  10/11/22 238 lb 9.6 oz (108.2 kg)  01/12/22 229 lb (103.9 kg)    Physical Exam Vitals and nursing note reviewed.  Constitutional:      General: She is not in acute distress.    Appearance: She is well-developed. She is not diaphoretic.     Comments: Well-appearing, comfortable, cooperative  HENT:     Head: Normocephalic and atraumatic.  Eyes:     General:        Right eye: No discharge.        Left eye: No discharge.     Conjunctiva/sclera: Conjunctivae normal.     Pupils: Pupils are equal, round, and reactive to light.  Neck:     Thyroid: No thyromegaly.  Cardiovascular:     Rate and Rhythm: Normal rate and regular rhythm.     Pulses: Normal pulses.     Heart sounds: Normal heart sounds. No murmur heard. Pulmonary:     Effort: Pulmonary effort is normal. No respiratory distress.     Breath sounds: Normal breath sounds. No wheezing or rales.  Abdominal:     General: Bowel sounds are normal. There is no distension.     Palpations: Abdomen is  soft. There is no mass.     Tenderness: There is no abdominal tenderness.  Musculoskeletal:        General: No tenderness. Normal range of motion.     Cervical back: Normal range of motion and neck supple.     Right lower leg: No edema.     Left lower leg: No edema.     Comments: Upper / Lower Extremities: - Normal muscle tone, strength bilateral upper extremities 5/5, lower extremities 5/5  Lymphadenopathy:     Cervical: No cervical adenopathy.  Skin:    General: Skin is warm and dry.     Findings: No erythema or rash.  Neurological:      Mental Status: She is alert and oriented to person, place, and time.     Comments: Distal sensation intact to light touch all extremities  Psychiatric:        Mood and Affect: Mood normal.        Behavior: Behavior normal.        Thought Content: Thought content normal.     Comments: Well groomed, good eye contact, normal speech and thoughts     Results for orders placed or performed in visit on 10/11/22  Basic metabolic panel  Result Value Ref Range   Glucose 100   Lipid panel  Result Value Ref Range   Triglycerides 172 (A) 40 - 160   Cholesterol 199 0 - 200   HDL 50 35 - 70   LDL Cholesterol 119   Hemoglobin A1c  Result Value Ref Range   Hemoglobin A1C 6.0       Assessment & Plan:   Problem List Items Addressed This Visit     Benign hypertension with CKD (chronic kidney disease) stage III   Relevant Medications   lisinopril (ZESTRIL) 5 MG tablet   atorvastatin (LIPITOR) 20 MG tablet   Fibromyalgia   Relevant Medications   gabapentin (NEURONTIN) 600 MG tablet   Hyperlipidemia   Relevant Medications   lisinopril (ZESTRIL) 5 MG tablet   atorvastatin (LIPITOR) 20 MG tablet   Other Relevant Orders   CT CARDIAC SCORING (SELF PAY ONLY)   Insomnia   Morbid obesity with body mass index (BMI) of 45.0 to 49.9 in adult (HCC)   OSA on CPAP   Other Visit Diagnoses     Annual physical exam    -  Primary   Moderate episode of recurrent major depressive disorder (HCC)       Encounter for screening mammogram for malignant neoplasm of breast       Relevant Orders   MM 3D SCREENING MAMMOGRAM BILATERAL BREAST   Family history of heart disease       Relevant Orders   CT CARDIAC SCORING (SELF PAY ONLY)   Hypertension       Relevant Medications   lisinopril (ZESTRIL) 5 MG tablet   atorvastatin (LIPITOR) 20 MG tablet        Updated Health Maintenance information Reviewed recent lab results with patient Encouraged improvement to lifestyle with diet and exercise Goal of  weight loss   Hyperlipidemia LDL elevated at 144. Discussed the risk of cardiovascular disease and the patient's family history of heart disease. -Continue current statin therapy. Atorvastatin 20mg  -Order CT scan of the heart to assess for calcified cholesterol plaque.  Vitamin B12 Deficiency Mild deficiency noted on recent labs. Patient started oral supplementation 3 weeks ago. Discussed the option of B12 injections if oral supplementation is insufficient. -Continue oral B12 supplementation. -  Reevaluate need for B12 injections at future visit.  Chronic Pain Patient reports chronic pain, possibly related to fibromyalgia, arthritis, and weight. Discussed various treatment options including intermittent use of anti-inflammatories, pain medications, and potential consultation with pain management specialists. -Encouraged patient to continue current pain management strategies and consider additional options as needed.  General Health Maintenance -All vaccinations and screenings are up to date. -Order mammogram for January 2025 as per patient's request. -Continue current medications, including Lisinopril for kidney protection. -Schedule follow-up visit in 6 months.         Orders Placed This Encounter  Procedures   MM 3D SCREENING MAMMOGRAM BILATERAL BREAST    Standing Status:   Future    Standing Expiration Date:   01/11/2024    Order Specific Question:   Reason for Exam (SYMPTOM  OR DIAGNOSIS REQUIRED)    Answer:   Screening bilateral 3D Mammogram Tomo    Order Specific Question:   Preferred imaging location?    Answer:   [facility]   CT CARDIAC SCORING (SELF PAY ONLY)    Standing Status:   Future    Standing Expiration Date:   01/11/2024    Order Specific Question:   Preferred imaging location?    Answer:   [facility]    Meds ordered this encounter  Medications   lisinopril (ZESTRIL) 5 MG tablet    Sig: Take 1 tablet (5 mg total) by mouth daily.    Dispense:   90 tablet    Refill:  3   gabapentin (NEURONTIN) 600 MG tablet    Sig: Take 1.5 tablets (900 mg total) by mouth 2 (two) times daily.    Dispense:  270 tablet    Refill:  3   atorvastatin (LIPITOR) 20 MG tablet    Sig: Take 1 tablet (20 mg total) by mouth daily.    Dispense:  90 tablet    Refill:  3     Follow up plan: Return in about 6 months (around 07/12/2023) for 6 month PreDM A1c, B12, Fibro/Pain updates.  Saralyn Pilar, DO [facility] 01/11/2023, 11:00 AM

## 2023-01-19 ENCOUNTER — Ambulatory Visit
Admission: RE | Admit: 2023-01-19 | Discharge: 2023-01-19 | Disposition: A | Discharge status: Home / Self Care | Payer: Self-pay | Source: Ambulatory Visit | Attending: Family Medicine | Admitting: Family Medicine

## 2023-01-19 DIAGNOSIS — E782 Mixed hyperlipidemia: Secondary | ICD-10-CM | POA: Insufficient documentation

## 2023-01-19 DIAGNOSIS — Z8249 Family history of ischemic heart disease and other diseases of the circulatory system: Secondary | ICD-10-CM | POA: Insufficient documentation

## 2023-02-16 ENCOUNTER — Other Ambulatory Visit: Payer: Self-pay | Admitting: Family Medicine

## 2023-02-16 DIAGNOSIS — M797 Fibromyalgia: Secondary | ICD-10-CM

## 2023-02-16 NOTE — Telephone Encounter (Signed)
Labs in date.  Requested Prescriptions  Pending Prescriptions Disp Refills   gabapentin (NEURONTIN) 600 MG tablet [Pharmacy Med Name: GABAPENTIN 600MG  TABLETS] 270 tablet 3    Sig: TAKE 1 AND 1/2 TABLETS(900 MG) BY MOUTH TWICE DAILY     Neurology: Anticonvulsants - gabapentin Failed - 02/16/2023  3:23 PM      Failed - Cr in normal range and within 360 days    Creatinine, Ser  Date Value Ref Range Status  12/20/2022 1.12 (H) 0.57 - 1.00 mg/dL Final         Passed - Completed PHQ-2 or PHQ-9 in the last 360 days      Passed - Valid encounter within last 12 months    Recent Outpatient Visits           1 month ago Annual physical exam   [place] [facility] [place], Netta Neat, DO   4 months ago Benign hypertension with CKD (chronic kidney disease) stage III   [place] [facility] Smitty Cords, DO   1 year ago Annual physical exam   [place] [facility] Smitty Cords, DO   1 year ago COVID-19 virus infection    [facility] Smitty Cords, DO   1 year ago Acute pain of right knee   [facility] [place], Netta Neat, [place]

## 2023-03-23 ENCOUNTER — Other Ambulatory Visit: Payer: Self-pay | Admitting: Family Medicine

## 2023-03-23 DIAGNOSIS — I10 Essential (primary) hypertension: Secondary | ICD-10-CM

## 2023-03-23 NOTE — Telephone Encounter (Signed)
 Rx 01/11/23 #90 [id number] soul be on file Requested Prescriptions  Pending Prescriptions Disp Refills   lisinopril (ZESTRIL) 5 MG tablet [Pharmacy Med Name: LISINOPRIL 5MG  TABLETS] 90 tablet 3    Sig: TAKE 1 TABLET(5 MG) BY MOUTH DAILY     Cardiovascular:  ACE Inhibitors Failed - 03/23/2023  2:02 PM      Failed - Cr in normal range and within 180 days    Creatinine, Ser  Date Value Ref Range Status  12/20/2022 1.12 (H) 0.57 - 1.00 mg/dL Final         Passed - K in normal range and within 180 days    Potassium  Date Value Ref Range Status  12/20/2022 4.5 3.5 - 5.2 mmol/L Final         Passed - Patient is not pregnant      Passed - Last BP in normal range    BP Readings from Last 1 Encounters:  01/11/23 124/80         Passed - Valid encounter within last 6 months    Recent Outpatient Visits           2 months ago Annual physical exam   [facility] [place], Netta Neat, DO   5 months ago Benign hypertension with CKD (chronic kidney disease) stage III   [place] [facility] Smitty Cords, DO   1 year ago Annual physical exam   [facility] Smitty Cords, DO   1 year ago COVID-19 virus infection   [place] [facility] Smitty Cords, DO   1 year ago Acute pain of right knee   [facility] [place], Netta Neat, [place]

## 2023-04-04 ENCOUNTER — Ambulatory Visit: Payer: PRIVATE HEALTH INSURANCE | Admitting: Family Medicine

## 2023-04-04 VITALS — BP 122/68 | HR 96 | Temp 97.9°F | Ht 61.0 in | Wt 239.0 lb

## 2023-04-04 DIAGNOSIS — J0110 Acute frontal sinusitis, unspecified: Secondary | ICD-10-CM

## 2023-04-04 DIAGNOSIS — J029 Acute pharyngitis, unspecified: Secondary | ICD-10-CM

## 2023-04-04 LAB — POC COVID19/FLU A&B COMBO
Covid Antigen, POC: NEGATIVE
Influenza A Antigen, POC: NEGATIVE
Influenza B Antigen, POC: NEGATIVE

## 2023-04-04 LAB — POCT RAPID STREP A (OFFICE): Rapid Strep A Screen: NEGATIVE

## 2023-04-04 MED ORDER — IPRATROPIUM BROMIDE 0.06 % NA SOLN: 2.0000 | Freq: Four times a day (QID) | NASAL | 0 refills | Status: DC

## 2023-04-04 MED ORDER — HYDROCOD POLI-CHLORPHE POLI ER 10-8 MG/5ML PO SUER: 5.0000 mL | Freq: Two times a day (BID) | ORAL | 0 refills | Status: DC | PRN

## 2023-04-04 MED ORDER — AMOXICILLIN-POT CLAVULANATE 875-125 MG PO TABS: 1.0000 | ORAL_TABLET | Freq: Two times a day (BID) | ORAL | 0 refills | Status: DC

## 2023-04-04 NOTE — Progress Notes (Signed)
 Subjective:    Patient ID: Rachel Townsend, female    DOB: Dec 12, 1960, 63 y.o.   MRN: [id number]  Rachel Townsend is a 63 y.o. female presenting on 04/04/2023 for Sinusitis   HPI  Patient presents for a same day appointment.   Discussed the use of AI scribe software for clinical note transcription with the patient, who gave verbal consent to proceed.  History of Present Illness   Rachel Townsend is a 63 year old female who presents with persistent congestion, sore throat, and ear pain.  She has been experiencing symptoms for approximately two weeks, initially presenting with fever and significant congestion. She was bedridden during this period but felt better by the following Saturday, except for general congestion.  Recently, her symptoms have worsened, with severe throat and ear pain developing last night. She also has a hoarse voice and a persistent phlegmy cough, which she attributes to postnasal drip. She mentions that her symptoms often start as a viral infection and then progress to a secondary infection due to allergies.  She has been using Tylenol and cough drops to manage her symptoms but finds them insufficient. She also has difficulty sleeping due to coughing. She has not been using nasal sprays as they do not work well for her.  Her husband was sick about three weeks ago, but she does not believe her current illness is related to his infection.           04/04/2023    2:56 PM 01/11/2023   10:31 AM 10/11/2022   10:40 AM  Depression screen PHQ 2/9  Decreased Interest 0 0 1  Down, Depressed, Hopeless 0  1  PHQ - 2 Score 0 0 2  Altered sleeping 1 0 1  Tired, decreased energy 1 2 1   Change in appetite 1 3 2   Feeling bad or failure about yourself  0 0 0  Trouble concentrating 0 0 1  Moving slowly or fidgety/restless 0 0 0  Suicidal thoughts 0 0 0  PHQ-9 Score 3 5 7   Difficult doing work/chores Not difficult at all  Not difficult at all       04/04/2023    2:56 PM  01/11/2023   10:32 AM 10/11/2022   10:40 AM 11/20/2020    2:10 PM  GAD 7 : Generalized Anxiety Score  Nervous, Anxious, on Edge 0 0 1 0  Control/stop worrying 1 0 1 0  Worry too much - different things 1 0 1 0  Trouble relaxing 1 0 1 0  Restless 1 0 1 0  Easily annoyed or irritable 1 0 1 0  Afraid - awful might happen 0 0 1 0  Total GAD 7 Score 5 0 7 0  Anxiety Difficulty    Not difficult at all    Social History   Tobacco Use   Smoking status: Former    Current packs/day: 0.00    Average packs/day: 1 pack/day for 10.0 years (10.0 ttl pk-yrs)    Types: Cigarettes    Start date: 11/24/1974    Quit date: 11/23/1984    Years since quitting: 38.3   Smokeless tobacco: Former  Building services engineer status: Never Used  Substance Use Topics   Alcohol use: Yes    Alcohol/week: 10.0 standard drinks of alcohol    Types: 10 Glasses of wine per week    Comment: weekly   Drug use: No    Review of Systems Per HPI unless specifically indicated above  Objective:    BP 122/68   Pulse 96   Temp 97.9 F (36.6 C)   Ht 5\' 1"  (1.549 m)   Wt 239 lb (108.4 kg)   SpO2 98%   BMI 45.16 kg/m   Wt Readings from Last 3 Encounters:  04/04/23 239 lb (108.4 kg)  01/11/23 242 lb (109.8 kg)  10/11/22 238 lb 9.6 oz (108.2 kg)    Physical Exam Vitals and nursing note reviewed.  Constitutional:      General: She is not in acute distress.    Appearance: She is well-developed. She is not diaphoretic.     Comments: Well-appearing, comfortable, cooperative  HENT:     Head: Normocephalic and atraumatic.     Right Ear: Tympanic membrane, ear canal and external ear normal. There is no impacted cerumen.     Left Ear: Tympanic membrane, ear canal and external ear normal. There is no impacted cerumen.     Ears:     Comments: Mild effusion bilateral TM Eyes:     General:        Right eye: No discharge.        Left eye: No discharge.     Conjunctiva/sclera: Conjunctivae normal.  Neck:      Thyroid: No thyromegaly.  Cardiovascular:     Rate and Rhythm: Normal rate and regular rhythm.     Heart sounds: Normal heart sounds. No murmur heard. Pulmonary:     Effort: Pulmonary effort is normal. No respiratory distress.     Breath sounds: Normal breath sounds. No wheezing or rales.  Musculoskeletal:        General: Normal range of motion.     Cervical back: Normal range of motion and neck supple.  Lymphadenopathy:     Cervical: No cervical adenopathy.  Skin:    General: Skin is warm and dry.     Findings: No erythema or rash.  Neurological:     Mental Status: She is alert and oriented to person, place, and time.  Psychiatric:        Behavior: Behavior normal.     Comments: Well groomed, good eye contact, normal speech and thoughts     Results for orders placed or performed in visit on 04/04/23  POCT rapid strep A   Collection Time: 04/04/23  5:17 PM  Result Value Ref Range   Rapid Strep A Screen Negative Negative  POC Covid19/Flu A&B Antigen   Collection Time: 04/04/23  5:18 PM  Result Value Ref Range   Influenza A Antigen, POC Negative Negative   Influenza B Antigen, POC Negative Negative   Covid Antigen, POC Negative Negative      Assessment & Plan:   Problem List Items Addressed This Visit   None Visit Diagnoses       Acute non-recurrent frontal sinusitis    -  Primary   Relevant Medications   ipratropium (ATROVENT) 0.06 % nasal spray   chlorpheniramine-HYDROcodone (TUSSIONEX) 10-8 MG/5ML   amoxicillin-clavulanate (AUGMENTIN) 875-125 MG tablet     Sore throat       Relevant Orders   POC Covid19/Flu A&B Antigen (Completed)   POCT rapid strep A (Completed)        Upper Respiratory Infection (URI) likely secondary to Sinusitis Symptoms include hoarseness, throat and ear pain, and postnasal drip. Negative for COVID, flu, and strep. Likely started as a viral infection and has persisted, possibly due to secondary bacterial infection. -Start Amoxicillin  twice daily for 10 days. -Use Atrovent nasal spray  to help with postnasal drip. -Use Tessalon cough syrup to help with cough and improve sleep.  Seasonal Allergies Chronic issue that may be contributing to current URI symptoms. -Continue over-the-counter treatments such as Claritin or Zyrtec as needed.         Orders Placed This Encounter  Procedures   POC Covid19/Flu A&B Antigen   POCT rapid strep A    Meds ordered this encounter  Medications   ipratropium (ATROVENT) 0.06 % nasal spray    Sig: Place 2 sprays into both nostrils 4 (four) times daily. For up to 5-7 days then stop.    Dispense:  15 mL    Refill:  0   chlorpheniramine-HYDROcodone (TUSSIONEX) 10-8 MG/5ML    Sig: Take 5 mLs by mouth every 12 (twelve) hours as needed for cough.    Dispense:  115 mL    Refill:  0   amoxicillin-clavulanate (AUGMENTIN) 875-125 MG tablet    Sig: Take 1 tablet by mouth 2 (two) times daily.    Dispense:  20 tablet    Refill:  0    Follow up plan: Return if symptoms worsen or fail to improve.    Saralyn Pilar, DO [facility] 04/04/2023, 3:14 PM

## 2023-04-04 NOTE — Patient Instructions (Addendum)
 Thank you for coming to the office today.  1. It sounds like you have a Sinusitis (Bacterial Infection) - this most likely started as an Upper Respiratory Virus that has settled into an infection. Allergies can also cause this. - Start Augmentin 1 pill twice daily (breakfast and dinner, with food and plenty of water) for 10 days, complete entire course, do not stop early even if feeling better  Start Atrovent nasal spray decongestant 2 sprays in each nostril up to 4 times daily for 7 days  Cough Syrup with hydrocodone at night especially  - Improve hydration by drinking plenty of clear fluids (water, gatorade) to reduce secretions and thin congestion - Congestion draining down throat can cause irritation. May try warm herbal tea with honey, cough drops - Can take Tylenol or Ibuprofen as needed for fevers - May continue over the counter cold medicine as you are, I would not use any decongestant or mucinex longer than 7 days.  If you develop persistent fever >101F for at least 3 consecutive days, headaches with sinus pain or pressure or persistent earache, please schedule a follow-up evaluation within next few days to week.  Please schedule a Follow-up Appointment to: Return if symptoms worsen or fail to improve.  If you have any other questions or concerns, please feel free to call the office or send a message through MyChart. You may also schedule an earlier appointment if necessary.  Additionally, you may be receiving a survey about your experience at our office within a few days to 1 week by e-mail or mail. We value your feedback.  Saralyn Pilar, DO [facility], [place]

## 2023-04-18 ENCOUNTER — Other Ambulatory Visit: Payer: Self-pay | Admitting: Family Medicine

## 2023-04-18 DIAGNOSIS — F5105 Insomnia due to other mental disorder: Secondary | ICD-10-CM

## 2023-04-19 NOTE — Telephone Encounter (Signed)
 Requested Prescriptions  Pending Prescriptions Disp Refills   sertraline (ZOLOFT) 100 MG tablet [Pharmacy Med Name: SERTRALINE 100MG  TABLETS] 90 tablet 0    Sig: TAKE 1 TABLET BY MOUTH DAILY     Psychiatry:  Antidepressants - SSRI - sertraline Passed - 04/19/2023 11:36 AM      Passed - AST in normal range and within 360 days    AST  Date Value Ref Range Status  12/20/2022 19 0 - 40 IU/L Final         Passed - ALT in normal range and within 360 days    ALT  Date Value Ref Range Status  12/20/2022 23 0 - 32 IU/L Final         Passed - Completed PHQ-2 or PHQ-9 in the last 360 days      Passed - Valid encounter within last 6 months    Recent Outpatient Visits           3 months ago Annual physical exam   [facility] [place], Netta Neat, DO   6 months ago Benign hypertension with CKD (chronic kidney disease) stage III   [facility] Smitty Cords, DO   1 year ago Annual physical exam   [facility] Smitty Cords, DO   1 year ago COVID-19 virus infection   [facility] [place], Netta Neat, DO   2 years ago Acute pain of right knee   [facility] [place], Netta Neat, [place]

## 2023-04-22 ENCOUNTER — Other Ambulatory Visit: Payer: Self-pay | Admitting: Family Medicine

## 2023-04-22 DIAGNOSIS — F5105 Insomnia due to other mental disorder: Secondary | ICD-10-CM

## 2023-04-24 MED ORDER — SERTRALINE HCL 100 MG PO TABS: 100.0000 mg | ORAL_TABLET | Freq: Every day | ORAL | 0 refills | Status: DC

## 2023-05-12 ENCOUNTER — Ambulatory Visit
Admission: RE | Admit: 2023-05-12 | Discharge: 2023-05-12 | Disposition: A | Discharge status: Home / Self Care | Payer: PRIVATE HEALTH INSURANCE | Source: Ambulatory Visit | Attending: Family Medicine | Admitting: Family Medicine

## 2023-05-12 DIAGNOSIS — Z1231 Encounter for screening mammogram for malignant neoplasm of breast: Secondary | ICD-10-CM | POA: Diagnosis present

## 2023-07-03 ENCOUNTER — Other Ambulatory Visit: Payer: Self-pay | Admitting: Family Medicine

## 2023-07-03 DIAGNOSIS — F5105 Insomnia due to other mental disorder: Secondary | ICD-10-CM

## 2023-07-04 NOTE — Telephone Encounter (Signed)
 Requested Prescriptions  Pending Prescriptions Disp Refills   sertraline  (ZOLOFT ) 100 MG tablet [Pharmacy Med Name: SERTRALINE  HCL 100 MG TABLET] 90 tablet 0    Sig: TAKE 1 TABLET BY MOUTH DAILY     Psychiatry:  Antidepressants - SSRI - sertraline  Failed - 07/04/2023 12:07 PM      Failed - Valid encounter within last 6 months    Recent Outpatient Visits           3 months ago Acute non-recurrent frontal sinusitis   [facility] [place], Kayleen Party, DO              Passed - AST in normal range and within 360 days    AST  Date Value Ref Range Status  12/20/2022 19 0 - 40 IU/L Final         Passed - ALT in normal range and within 360 days    ALT  Date Value Ref Range Status  12/20/2022 23 0 - 32 IU/L Final         Passed - Completed PHQ-2 or PHQ-9 in the last 360 days

## 2023-07-12 ENCOUNTER — Ambulatory Visit: Payer: Self-pay | Admitting: Family Medicine

## 2023-07-12 ENCOUNTER — Ambulatory Visit: Payer: PRIVATE HEALTH INSURANCE | Admitting: Family Medicine

## 2023-08-14 ENCOUNTER — Other Ambulatory Visit: Payer: Self-pay | Admitting: Family Medicine

## 2023-08-14 DIAGNOSIS — E782 Mixed hyperlipidemia: Secondary | ICD-10-CM

## 2023-08-15 ENCOUNTER — Other Ambulatory Visit: Payer: Self-pay

## 2023-08-15 DIAGNOSIS — E782 Mixed hyperlipidemia: Secondary | ICD-10-CM

## 2023-08-15 MED ORDER — ATORVASTATIN CALCIUM 20 MG PO TABS: 20.0000 mg | ORAL_TABLET | Freq: Every day | ORAL | 3 refills | Status: AC

## 2023-08-15 NOTE — Telephone Encounter (Signed)
 Requested Prescriptions  Refused Prescriptions Disp Refills   atorvastatin  (LIPITOR) 20 MG tablet [Pharmacy Med Name: ATORVASTATIN  20 MG TABLET] 90 tablet 3    Sig: TAKE 1 TABLET BY MOUTH DAILY     Cardiovascular:  Antilipid - Statins Failed - 08/15/2023  2:45 PM      Failed - Valid encounter within last 12 months    Recent Outpatient Visits           4 months ago Acute non-recurrent frontal sinusitis   [facility] [place], Marsa PARAS, DO              Failed - Lipid Panel in normal range within the last 12 months    Cholesterol, Total  Date Value Ref Range Status  12/20/2022 249 (H) 100 - 199 mg/dL Final   LDL Chol Calc (NIH)  Date Value Ref Range Status  12/20/2022 144 (H) 0 - 99 mg/dL Final   HDL  Date Value Ref Range Status  12/20/2022 58 >39 mg/dL Final   Triglycerides  Date Value Ref Range Status  12/20/2022 261 (H) 0 - 149 mg/dL Final         Passed - Patient is not pregnant

## 2023-09-29 ENCOUNTER — Other Ambulatory Visit: Payer: Self-pay | Admitting: Family Medicine

## 2023-09-29 DIAGNOSIS — F5105 Insomnia due to other mental disorder: Secondary | ICD-10-CM

## 2023-10-01 NOTE — Telephone Encounter (Signed)
 OFFICE VISIT NEEDED FOR ADDITIONAL REFILLS.  Requested Prescriptions  Pending Prescriptions Disp Refills   sertraline  (ZOLOFT ) 100 MG tablet [Pharmacy Med Name: SERTRALINE  HCL 100 MG TABLET] 30 tablet 0    Sig: TAKE 1 TABLET BY MOUTH DAILY     Psychiatry:  Antidepressants - SSRI - sertraline  Failed - 10/01/2023 10:43 AM      Failed - Valid encounter within last 6 months    Recent Outpatient Visits           6 months ago Acute non-recurrent frontal sinusitis   [facility] [place], Marsa PARAS, DO              Passed - AST in normal range and within 360 days    AST  Date Value Ref Range Status  12/20/2022 19 0 - 40 IU/L Final         Passed - ALT in normal range and within 360 days    ALT  Date Value Ref Range Status  12/20/2022 23 0 - 32 IU/L Final         Passed - Completed PHQ-2 or PHQ-9 in the last 360 days

## 2023-10-30 ENCOUNTER — Other Ambulatory Visit: Payer: Self-pay | Admitting: Family Medicine

## 2023-10-30 DIAGNOSIS — F5105 Insomnia due to other mental disorder: Secondary | ICD-10-CM

## 2023-10-31 NOTE — Telephone Encounter (Signed)
 Requested medications are due for refill today.  yes  Requested medications are on the active medications list.  yes  Last refill. 10/01/2023 #30 0 rf  Future visit scheduled.   no  Notes to clinic.  Pt was to rtc in 07/21/23 - pt cancelled 2 appts in June.  Courtesy refill already given.    Requested Prescriptions  Pending Prescriptions Disp Refills   sertraline  (ZOLOFT ) 100 MG tablet [Pharmacy Med Name: SERTRALINE  HCL 100 MG TABLET] 30 tablet 0    Sig: TAKE 1 TABLET BY MOUTH DAILY     Psychiatry:  Antidepressants - SSRI - sertraline  Failed - 10/31/2023  4:33 PM      Failed - Valid encounter within last 6 months    Recent Outpatient Visits           7 months ago Acute non-recurrent frontal sinusitis   [facility] [place], Marsa PARAS, DO              Passed - AST in normal range and within 360 days    AST  Date Value Ref Range Status  12/20/2022 19 0 - 40 IU/L Final         Passed - ALT in normal range and within 360 days    ALT  Date Value Ref Range Status  12/20/2022 23 0 - 32 IU/L Final         Passed - Completed PHQ-2 or PHQ-9 in the last 360 days

## 2023-11-01 ENCOUNTER — Ambulatory Visit (INDEPENDENT_AMBULATORY_CARE_PROVIDER_SITE_OTHER): Payer: PRIVATE HEALTH INSURANCE | Admitting: Family Medicine

## 2023-11-01 ENCOUNTER — Encounter: Payer: Self-pay | Admitting: Family Medicine

## 2023-11-01 VITALS — BP 124/80 | HR 61 | Ht 61.0 in | Wt 233.0 lb

## 2023-11-01 DIAGNOSIS — F3341 Major depressive disorder, recurrent, in partial remission: Secondary | ICD-10-CM

## 2023-11-01 DIAGNOSIS — M150 Primary generalized (osteo)arthritis: Secondary | ICD-10-CM | POA: Insufficient documentation

## 2023-11-01 DIAGNOSIS — E538 Deficiency of other specified B group vitamins: Secondary | ICD-10-CM

## 2023-11-01 DIAGNOSIS — F5105 Insomnia due to other mental disorder: Secondary | ICD-10-CM | POA: Diagnosis not present

## 2023-11-01 DIAGNOSIS — F99 Mental disorder, not otherwise specified: Secondary | ICD-10-CM

## 2023-11-01 DIAGNOSIS — I10 Essential (primary) hypertension: Secondary | ICD-10-CM | POA: Diagnosis not present

## 2023-11-01 DIAGNOSIS — Z23 Encounter for immunization: Secondary | ICD-10-CM

## 2023-11-01 DIAGNOSIS — M797 Fibromyalgia: Secondary | ICD-10-CM | POA: Diagnosis not present

## 2023-11-01 DIAGNOSIS — Z6841 Body Mass Index (BMI) 40.0 and over, adult: Secondary | ICD-10-CM

## 2023-11-01 DIAGNOSIS — G4733 Obstructive sleep apnea (adult) (pediatric): Secondary | ICD-10-CM

## 2023-11-01 MED ORDER — LISINOPRIL 5 MG PO TABS: 5.0000 mg | ORAL_TABLET | Freq: Every day | ORAL | 3 refills | Status: AC

## 2023-11-01 MED ORDER — SERTRALINE HCL 100 MG PO TABS: 100.0000 mg | ORAL_TABLET | Freq: Every day | ORAL | 3 refills | Status: AC

## 2023-11-01 MED ORDER — BUPROPION HCL ER (XL) 300 MG PO TB24: 300.0000 mg | ORAL_TABLET | Freq: Every day | ORAL | 3 refills | Status: AC

## 2023-11-01 NOTE — Patient Instructions (Addendum)
 Thank you for coming to the office today.  Medications refilled  Updated Gabapentin   Did not re order Atorvastatin  yet, it is good through the Summer  Consider COVID Booster  Updated Flu Shot today  Future Prevnar-20 pneumonia vaccine age 64+  Please schedule a Follow-up Appointment to: Return in about 6 months (around 05/01/2024) for 6 month Annual Physical labs.  If you have any other questions or concerns, please feel free to call the office or send a message through MyChart. You may also schedule an earlier appointment if necessary.  Additionally, you may be receiving a survey about your experience at our office within a few days to 1 week by e-mail or mail. We value your feedback.  Marsa Officer, DO [facility], [place]

## 2023-11-01 NOTE — Progress Notes (Signed)
 Subjective:    Patient ID: Rachel Townsend, female    DOB: 05-Sep-1960, 63 y.o.   MRN: [id number]  Rachel Townsend is a 63 y.o. female presenting on 11/01/2023 for Medical Management of Chronic Issues   HPI  Discussed the use of AI scribe software for clinical note transcription with the patient, who gave verbal consent to proceed.  History of Present Illness   Rachel Townsend is a 63 year old female who presents for medication refill follow-up.  Medication management - Present for medication refill follow-up. - Insurance coverage for medications is inadequate; using a discount card to manage costs. - Prefers 90-day medication supplies to minimize pharmacy visits. - Current medications: sertraline  100 mg daily, gabapentin  600 mg twice daily (recently adjusted from 1.5 tablets to 1 tablet twice daily), Wellbutrin  XL 300 mg daily, atorvastatin  (due for refill next summer).  Polyarthralgia and joint dysfunction - Joint pain primarily in fingers, thumb, and wrist, attributed to aging and arthritis. - History of left wrist fracture with metal plate insertion, later removed due to calcification and pain. - History of hallux rigidus with prior joint removal; now experiencing similar symptoms in another joint. - Advised to consider knee replacements but not ready to proceed.  Vitamin b12 deficiency - History of low vitamin B12 levels. - Taking super B complex vitamin supplementation.  Hyperlipidemia - Managing cholesterol with atorvastatin . - Cardiovascular screening completed last year.   Major Depression recurrent partial remission Controlled on Wellbutrin     Hypertension Controlled on Losartan  Osteoarthritis multiple joints hands, feet other joints   Health Maintenance: Flu Shot today  She declines Prevnar-20 vaccine today, will wait til 64.  Past Surgical History:  Procedure Laterality Date   BIOPSY  02/20/2020   Procedure: BIOPSY;  Surgeon: Unk Corinn Skiff, MD;   Location: [facility];  Service: Endoscopy;;   BREAST BIOPSY Left    core- neg   BREAST BIOPSY Left    stereo- neg   COLONOSCOPY WITH PROPOFOL  N/A 02/20/2020   Procedure: COLONOSCOPY WITH PROPOFOL ;  Surgeon: Unk Corinn Skiff, MD;  Location: [facility];  Service: Endoscopy;  Laterality: N/A;   ECTOPIC PREGNANCY SURGERY     FOOT SURGERY Left    HARDWARE REMOVAL Right 12/01/2020   Procedure: HARDWARE REMOVAL (WRIST);  Surgeon: Cleotilde Barrio, MD;  Location: [facility];  Service: Orthopedics;  Laterality: Right;   ORIF RADIAL FRACTURE Right 02/18/2019   Procedure: OPEN REDUCTION INTERNAL FIXATION (ORIF) RADIAL FRACTURE;  Surgeon: Cleotilde Barrio, MD;  Location: [facility];  Service: Orthopedics;  Laterality: Right;   POLYPECTOMY  02/20/2020   Procedure: POLYPECTOMY;  Surgeon: Unk Corinn Skiff, MD;  Location: [facility];  Service: Endoscopy;;   WISDOM TOOTH EXTRACTION          11/01/2023    1:05 PM 04/04/2023    2:56 PM 01/11/2023   10:31 AM  Depression screen PHQ 2/9  Decreased Interest 0 0 0  Down, Depressed, Hopeless 0 0   PHQ - 2 Score 0 0 0  Altered sleeping 1 1 0  Tired, decreased energy 1 1 2   Change in appetite 0 1 3  Feeling bad or failure about yourself  0 0 0  Trouble concentrating 0 0 0  Moving slowly or fidgety/restless 0 0 0  Suicidal thoughts 0 0 0  PHQ-9 Score 2 3 5   Difficult doing work/chores Not difficult at all Not difficult at all        04/04/2023    2:56 PM  01/11/2023   10:32 AM 10/11/2022   10:40 AM 11/20/2020    2:10 PM  GAD 7 : Generalized Anxiety Score  Nervous, Anxious, on Edge 0 0 1 0  Control/stop worrying 1 0 1 0  Worry too much - different things 1 0 1 0  Trouble relaxing 1 0 1 0  Restless 1 0 1 0  Easily annoyed or irritable 1 0 1 0  Afraid - awful might happen 0 0 1 0  Total GAD 7 Score 5 0 7 0  Anxiety Difficulty    Not difficult at all    Social History   Tobacco Use   Smoking status: Former    Current  packs/day: 0.00    Average packs/day: 1 pack/day for 10.0 years (10.0 ttl pk-yrs)    Types: Cigarettes    Start date: 11/24/1974    Quit date: 11/23/1984    Years since quitting: 38.9   Smokeless tobacco: Former  Building services engineer status: Never Used  Substance Use Topics   Alcohol use: Yes    Alcohol/week: 10.0 standard drinks of alcohol    Types: 10 Glasses of wine per week    Comment: weekly   Drug use: No    Review of Systems Per HPI unless specifically indicated above     Objective:    BP 124/80 (BP Location: Left Arm, Patient Position: Sitting, Cuff Size: Large)   Pulse 61   Ht 5' 1 (1.549 m)   Wt 233 lb (105.7 kg)   SpO2 96%   BMI 44.02 kg/m   Wt Readings from Last 3 Encounters:  11/01/23 233 lb (105.7 kg)  04/04/23 239 lb (108.4 kg)  01/11/23 242 lb (109.8 kg)    Physical Exam Vitals and nursing note reviewed.  Constitutional:      General: She is not in acute distress.    Appearance: She is well-developed. She is obese. She is not diaphoretic.     Comments: Well-appearing, comfortable, cooperative  HENT:     Head: Normocephalic and atraumatic.  Eyes:     General:        Right eye: No discharge.        Left eye: No discharge.     Conjunctiva/sclera: Conjunctivae normal.  Neck:     Thyroid: No thyromegaly.  Cardiovascular:     Rate and Rhythm: Normal rate and regular rhythm.     Heart sounds: Normal heart sounds. No murmur heard. Pulmonary:     Effort: Pulmonary effort is normal. No respiratory distress.     Breath sounds: Normal breath sounds. No wheezing or rales.  Musculoskeletal:        General: Normal range of motion.     Cervical back: Normal range of motion and neck supple.  Lymphadenopathy:     Cervical: No cervical adenopathy.  Skin:    General: Skin is warm and dry.     Findings: No erythema or rash.  Neurological:     Mental Status: She is alert and oriented to person, place, and time.  Psychiatric:        Behavior: Behavior  normal.     Comments: Well groomed, good eye contact, normal speech and thoughts     Results for orders placed or performed in visit on 04/04/23  POCT rapid strep A   Collection Time: 04/04/23  5:17 PM  Result Value Ref Range   Rapid Strep A Screen Negative Negative  POC Covid19/Flu A&B Antigen   Collection Time:  04/04/23  5:18 PM  Result Value Ref Range   Influenza A Antigen, POC Negative Negative   Influenza B Antigen, POC Negative Negative   Covid Antigen, POC Negative Negative      Assessment & Plan:   Problem List Items Addressed This Visit     Fibromyalgia   Relevant Medications   sertraline  (ZOLOFT ) 100 MG tablet   gabapentin  (NEURONTIN ) 600 MG tablet   buPROPion  (WELLBUTRIN  XL) 300 MG 24 hr tablet   Insomnia   Relevant Medications   sertraline  (ZOLOFT ) 100 MG tablet   Major depressive disorder, recurrent, in partial remission   Relevant Medications   sertraline  (ZOLOFT ) 100 MG tablet   buPROPion  (WELLBUTRIN  XL) 300 MG 24 hr tablet   OSA on CPAP   Primary osteoarthritis involving multiple joints   Other Visit Diagnoses       Primary hypertension    -  Primary   Relevant Medications   lisinopril  (ZESTRIL ) 5 MG tablet     Flu vaccine need       Relevant Orders   Flu vaccine trivalent PF, 6mos and older(Flulaval,Afluria,Fluarix,Fluzone) (Completed)     Vitamin B12 nutritional deficiency         Morbid obesity with BMI of 40.0-44.9, adult (HCC)            Medication Management and Refills Refills needed for chronic medications. Insurance issues affecting coverage. Prefers 90-day supply for convenience. Utilizes a discount card for medication costs. - Prescribe sertraline  100 mg for 90 days with refills. - Prescribe lisinopril  with refills. - Update gabapentin  to 600 mg twice a day in notes, no pharmacy order. - Prescribe Wellbutrin  XL 300 mg daily with refills. - Continue atorvastatin  as previously prescribed.  Hypertension Currently managed with  lisinopril . - Continue lisinopril  as prescribed.  Hyperlipidemia Managed with atorvastatin . - Continue atorvastatin  as prescribed.  Morbid Obesity BMI >44 Encourage lifestyle modification diet exercise  Depression recurrent partial remission Managed with sertraline  and Wellbutrin  XL. - Continue sertraline  and Wellbutrin  XL as prescribed.  Chronic pain syndrome with osteoarthritis of multiple sites and right hallux rigidus Chronic pain in hands, possibly related to osteoarthritis. Right wrist fracture with hardware removal. Hallux rigidus noted in the past, with current symptoms in another joint. Prefers conservative symptom management. - Consider future imaging if symptoms worsen or insurance coverage improves. - Followed by Podiatry / Orthopedics  Vitamin B12 deficiency Previously noted low B12 levels. Currently taking a super B complex. No acute symptoms reported. - Check B12 levels at next visit. Consider B12 injections if levels remain low.  General Health Maintenance Flu shot received. Eligible for pneumonia vaccine. COVID booster discussed but not pursued due to vaccine fatigue and logistical issues. Pneumonia vaccine considered beneficial due to its long-lasting protection compared to viral vaccines. - Administer pneumonia vaccine at age 63 or earlier if desired. - Consider COVID booster if circumstances change.        Orders Placed This Encounter  Procedures   Flu vaccine trivalent PF, 6mos and older(Flulaval,Afluria,Fluarix,Fluzone)    Meds ordered this encounter  Medications   sertraline  (ZOLOFT ) 100 MG tablet    Sig: Take 1 tablet (100 mg total) by mouth daily.    Dispense:  90 tablet    Refill:  3   lisinopril  (ZESTRIL ) 5 MG tablet    Sig: Take 1 tablet (5 mg total) by mouth daily.    Dispense:  90 tablet    Refill:  3   buPROPion  (WELLBUTRIN  XL) 300 MG 24  hr tablet    Sig: Take 1 tablet (300 mg total) by mouth daily.    Dispense:  90 tablet    Refill:  3     Follow up plan: Return in about 6 months (around 05/01/2024) for 6 month Annual Physical labs.  She will need lab orders including B12, routine labs CMET CBC A1c Lipid TSH Vitamin D   Marsa Officer, DO [facility] 11/01/2023, 10:31 AM

## 2024-02-14 ENCOUNTER — Ambulatory Visit: Payer: PRIVATE HEALTH INSURANCE | Admitting: Family Medicine

## 2024-02-14 ENCOUNTER — Encounter: Payer: Self-pay | Admitting: Family Medicine

## 2024-02-14 VITALS — BP 124/68 | HR 77 | Ht 61.0 in | Wt 236.2 lb

## 2024-02-14 DIAGNOSIS — M170 Bilateral primary osteoarthritis of knee: Secondary | ICD-10-CM | POA: Diagnosis not present

## 2024-02-14 DIAGNOSIS — F3341 Major depressive disorder, recurrent, in partial remission: Secondary | ICD-10-CM

## 2024-02-14 DIAGNOSIS — Z6841 Body Mass Index (BMI) 40.0 and over, adult: Secondary | ICD-10-CM | POA: Diagnosis not present

## 2024-02-14 DIAGNOSIS — I10 Essential (primary) hypertension: Secondary | ICD-10-CM

## 2024-02-14 DIAGNOSIS — G4733 Obstructive sleep apnea (adult) (pediatric): Secondary | ICD-10-CM | POA: Diagnosis not present

## 2024-02-14 MED ORDER — WEGOVY 0.25 MG/0.5ML ~~LOC~~ SOAJ: 0.2500 mg | SUBCUTANEOUS | Status: DC

## 2024-02-14 NOTE — Patient Instructions (Addendum)
 Thank you for coming to the office today.  [email or web address] .com/obesity/what-[email or web address]-pay-[email or web address] .[email or web address]  Try the Wegovy  injections 0.25mg  weekly for 4 weeks  Message me in 2 weeks or more, let me know if you are good to proceed w/ the Wegovy  oral pills, we can attempt to get it covered, if not then it is the cash pay option at the pharmacy.   [email or web address]  After we order oral wegovy , dose increases every 30 days, and then you return every  3 months  Starting dose is 3mg  once daily, first thing in morning on empty stomach, sip of water , no other medicines. Wait 30 min before first meal of day.  After 30 days we need to increase dose. Every 30 days increase until max dose   Please schedule a Follow-up Appointment to: No follow-ups on file.  If you have any other questions or concerns, please feel free to call the office or send a message through MyChart. You may also schedule an earlier appointment if necessary.  Additionally, you may be receiving a survey about your experience at our office within a few days to 1 week by e-mail or mail. We value your feedback.  Marsa Officer, DO [facility], [place]

## 2024-02-14 NOTE — Progress Notes (Addendum)
 "  Subjective:    Patient ID: Rachel Townsend, female    DOB: 13-May-1960, 64 y.o.   MRN: [id number]  Rachel Townsend is a 64 y.o. female presenting on 02/14/2024 for Weight Loss   HPI  Discussed the use of AI scribe software for clinical note transcription with the patient, who gave verbal consent to proceed.  History of Present Illness   Rachel Townsend is a 64 year old female who presents with concerns about weight loss and knee pain.  Morbid Obesity  BMI >44 Weight management concerns - Seeking medical advice for weight loss, motivated by improved insurance coverage - No prior use of weight loss medications - Interested in exploring pharmacologic options including Wegovy  and Zepbound  Chronic Knee pain - Persistent and significant knee pain - Difficulty walking after water  aerobics due to knee pain - Experiences relief while in water  but increased pain and mobility challenges on land due to pressure on knees - Attempted to schedule orthopedic evaluation in [place], but appointment confirmation was unclear in MyChart  Functional status and lifestyle - Retired  Major Depression recurrent partial remission On medication management with Wellbutrin   OSA on CPAP  Polyarthralgia and joint dysfunction - Joint pain primarily in fingers, thumb, and wrist, attributed to aging and arthritis.     02/14/2024    9:05 AM 11/01/2023    1:05 PM 04/04/2023    2:56 PM  Depression screen PHQ 2/9  Decreased Interest 1 0 0  Down, Depressed, Hopeless 1 0 0  PHQ - 2 Score 2 0 0  Altered sleeping 1 1 1   Tired, decreased energy 1 1 1   Change in appetite 1 0 1  Feeling bad or failure about yourself  1 0 0  Trouble concentrating 1 0 0  Moving slowly or fidgety/restless 0 0 0  Suicidal thoughts 0 0 0  PHQ-9 Score 7 2  3    Difficult doing work/chores Not difficult at all Not difficult at all Not difficult at all     Data saved with a previous flowsheet row definition       02/14/2024    9:05  AM 04/04/2023    2:56 PM 01/11/2023   10:32 AM 10/11/2022   10:40 AM  GAD 7 : Generalized Anxiety Score  Nervous, Anxious, on Edge 1 0 0 1  Control/stop worrying 1 1 0 1  Worry too much - different things 1 1 0 1  Trouble relaxing 1 1 0 1  Restless 1 1 0 1  Easily annoyed or irritable 1 1 0 1  Afraid - awful might happen 1 0 0 1  Total GAD 7 Score 7 5 0 7  Anxiety Difficulty Not difficult at all       Social History[1]  Review of Systems Per HPI unless specifically indicated above     Objective:    BP 124/68 (BP Location: Right Arm, Patient Position: Sitting, Cuff Size: Large)   Pulse 77   Ht 5' 1 (1.549 m)   Wt 236 lb 4 oz (107.2 kg)   SpO2 97%   BMI 44.64 kg/m   Wt Readings from Last 3 Encounters:  02/14/24 236 lb 4 oz (107.2 kg)  11/01/23 233 lb (105.7 kg)  04/04/23 239 lb (108.4 kg)    Physical Exam Vitals and nursing note reviewed.  Constitutional:      General: She is not in acute distress.    Appearance: Normal appearance. She is well-developed. She is obese. She is not diaphoretic.  Comments: Well-appearing, comfortable, cooperative  HENT:     Head: Normocephalic and atraumatic.  Eyes:     General:        Right eye: No discharge.        Left eye: No discharge.     Conjunctiva/sclera: Conjunctivae normal.  Neck:     Thyroid: No thyromegaly.  Cardiovascular:     Rate and Rhythm: Normal rate and regular rhythm.     Heart sounds: Normal heart sounds. No murmur heard. Pulmonary:     Effort: Pulmonary effort is normal. No respiratory distress.     Breath sounds: Normal breath sounds. No wheezing or rales.  Musculoskeletal:        General: Normal range of motion.     Cervical back: Normal range of motion and neck supple.  Lymphadenopathy:     Cervical: No cervical adenopathy.  Skin:    General: Skin is warm and dry.     Findings: No erythema or rash.  Neurological:     Mental Status: She is alert and oriented to person, place, and time.   Psychiatric:        Mood and Affect: Mood normal.        Behavior: Behavior normal.        Thought Content: Thought content normal.     Comments: Well groomed, good eye contact, normal speech and thoughts     Results for orders placed or performed in visit on 04/04/23  POCT rapid strep A   Collection Time: 04/04/23  5:17 PM  Result Value Ref Range   Rapid Strep A Screen Negative Negative  POC Covid19/Flu A&B Antigen   Collection Time: 04/04/23  5:18 PM  Result Value Ref Range   Influenza A Antigen, POC Negative Negative   Influenza B Antigen, POC Negative Negative   Covid Antigen, POC Negative Negative      Assessment & Plan:   Problem List Items Addressed This Visit     Major depressive disorder, recurrent, in partial remission   OSA on CPAP   Other Visit Diagnoses       Morbid obesity with BMI of 40.0-44.9, adult (HCC)    -  Primary   Relevant Medications   semaglutide -weight management (WEGOVY ) 0.25 MG/0.5ML SOAJ SQ injection     Primary hypertension         Primary osteoarthritis of both knees            Morbid obesity BMI >44 Significant complications with knee pain osteoarthritis, OSA, Hyperlipidemia, Hypertension. Discussed oral Wegovy  as a new option with potential cost savings. Previous history lack of coverage on injectable therapy. Insurance coverage uncertain. Considered Contrave as an alternative, but noted potential interaction with Wellbutrin . Discussed benefits of weight loss on knee pain and mobility. - Provided free sample of Wegovy  0.25mg  weekly x 4 weeks for trial. - Instructed to message in two weeks to report on trial experience and decision to proceed with oral Wegovy . - If proceeding with oral Wegovy , will attempt to obtain insurance coverage or proceed with self-pay option. - Scheduled follow-up every three months to monitor weight and progress. - Discussed potential use of online nutritionist service for additional support.      Lifestyle  Documentation  We had a detailed discussion today reviewing course of lifestyle modifications to assist in weight loss and promote overall health. She remains motivated and committed to continuing these efforts while starting to take weight loss / appetite reducing GLP1 medication.  For diet she has  been following caloric reduced diet, limited calorie intake with emphasis on high protein foods and low carbohydrate intake based on structured meal plan.   For exercise, she has been limited due to chronic knee pain and physically unable to participate in a structured regimen of cardiovascular exercise  - Schedule follow-up in three months to assess weight and medication efficacy.     No orders of the defined types were placed in this encounter.   Meds ordered this encounter  Medications   semaglutide -weight management (WEGOVY ) 0.25 MG/0.5ML SOAJ SQ injection    Sig: Inject 0.25 mg into the skin once a week.    Follow up plan: Return in about 3 months (around 05/14/2024) for 3 month follow-up Weight Management.  Marsa Officer, DO [facility] 02/14/2024, 9:34 AM     [1]  Social History Tobacco Use   Smoking status: Former    Current packs/day: 0.00    Average packs/day: 1 pack/day for 10.0 years (10.0 ttl pk-yrs)    Types: Cigarettes    Start date: 11/24/1974    Quit date: 11/23/1984    Years since quitting: 39.2   Smokeless tobacco: Former  Building Services Engineer status: Never Used  Substance Use Topics   Alcohol use: Yes    Alcohol/week: 10.0 standard drinks of alcohol    Types: 10 Glasses of wine per week    Comment: weekly   Drug use: No   "

## 2024-02-22 ENCOUNTER — Other Ambulatory Visit: Payer: Self-pay

## 2024-02-22 ENCOUNTER — Ambulatory Visit: Payer: PRIVATE HEALTH INSURANCE | Admitting: Orthopaedic Surgery

## 2024-02-22 DIAGNOSIS — M1712 Unilateral primary osteoarthritis, left knee: Secondary | ICD-10-CM

## 2024-02-22 DIAGNOSIS — M1711 Unilateral primary osteoarthritis, right knee: Secondary | ICD-10-CM

## 2024-02-22 DIAGNOSIS — Z6841 Body Mass Index (BMI) 40.0 and over, adult: Secondary | ICD-10-CM

## 2024-02-22 NOTE — Progress Notes (Signed)
 "  Office Visit Note   Patient: Rachel Townsend           Date of Birth: 1960/02/24           MRN: [id number] Visit Date: 02/22/2024              Requested by: Edman Marsa PARAS, DO [address] [place],  [place] [address] PCP: Edman Marsa PARAS, DO   Assessment & Plan: Visit Diagnoses:  1. Primary osteoarthritis of left knee   2. Body mass index 40.0-44.9, adult (HCC)   3. Primary osteoarthritis of right knee     Plan: Impression is bilateral knee advanced DJD.  She would like to resume injections again.  Steroid injections administered today.  Will get approval for viscosupplementation as well.  Follow-up as needed.  Follow-Up Instructions: No follow-ups on file.   Orders:  Orders Placed This Encounter  Procedures   XR KNEE 3 VIEW RIGHT   XR KNEE 3 VIEW LEFT   Ambulatory request for injection medication   No orders of the defined types were placed in this encounter.     Procedures: No procedures performed   Clinical Data: No additional findings.   Subjective: Chief Complaint  Patient presents with   Left Knee - Pain   Right Knee - Pain    HPI Rachel Townsend is a 64 year old female here for evaluation of chronic bilateral knee pain from advanced DJD.  Denies any recent injuries.  She used to get steroid injections on a regular basis.  She has been working on weight loss. Review of Systems  Constitutional: Negative.   HENT: Negative.    Eyes: Negative.   Respiratory: Negative.    Cardiovascular: Negative.   Endocrine: Negative.   Musculoskeletal: Negative.   Neurological: Negative.   Hematological: Negative.   Psychiatric/Behavioral: Negative.    All other systems reviewed and are negative.    Objective: Vital Signs: There were no vitals taken for this visit.  Physical Exam Vitals and nursing note reviewed.  Constitutional:      Appearance: She is well-developed.  HENT:     Head: Atraumatic.     Nose: Nose normal.  Eyes:     Extraocular  Movements: Extraocular movements intact.  Cardiovascular:     Pulses: Normal pulses.  Pulmonary:     Effort: Pulmonary effort is normal.  Abdominal:     Palpations: Abdomen is soft.  Musculoskeletal:     Cervical back: Neck supple.  Skin:    General: Skin is warm.     Capillary Refill: Capillary refill takes less than 2 seconds.  Neurological:     Mental Status: She is alert. Mental status is at baseline.  Psychiatric:        Behavior: Behavior normal.        Thought Content: Thought content normal.        Judgment: Judgment normal.     Ortho Exam Exam of bilateral knees showed no joint effusion.  2+ patellofemoral crepitus with range of motion.  Because effusions are stable. Specialty Comments:  No specialty comments available.  Imaging: XR KNEE 3 VIEW LEFT Result Date: 02/22/2024 X-rays of the left knee show advanced tricompartmental osteoarthritis.  Bone-on-bone joint space narrowing.  Kellgren-Lawrence stage IV  XR KNEE 3 VIEW RIGHT Result Date: 02/22/2024 X-rays demonstrate severe tricompartmental osteoarthritis.  Bone-on-bone joint space narrowing.  Kellgren-Lawrence stage IV    PMFS History: Patient Active Problem List   Diagnosis Date Noted   Primary osteoarthritis involving multiple  joints 11/01/2023   Pre-diabetes 10/11/2022   History of colonic polyps    Chronic kidney disease (CKD), stage III (moderate) (HCC) 10/30/2018   De Quervain's tenosynovitis, right 08/08/2016   Hyperlipidemia 05/24/2016   OSA on CPAP 05/04/2016   Chronic fatigue 08/17/2015   Sciatica 05/14/2015   Morbid obesity with body mass index (BMI) of 45.0 to 49.9 in adult [facility]) 03/02/2015   Fibromyalgia 07/16/2014   Major depressive disorder, recurrent, in partial remission 07/16/2014   Insomnia 07/16/2014   Benign hypertension with CKD (chronic kidney disease) stage III 07/16/2014   Past Medical History:  Diagnosis Date   Allergy    Family history of adverse reaction to anesthesia     sister- PONV   Fibromyalgia    GERD (gastroesophageal reflux disease)    Hypertension    Pre-diabetes    Sleep apnea    uses CPAP   Stage III chronic kidney disease (HCC)    Wrist pain     Family History  Problem Relation Age of Onset   Hypertension Mother    Diabetes Mother    Hyperlipidemia Mother    Cancer Mother    Breast cancer Mother 60   Hypertension Father    Hyperlipidemia Father    Breast cancer Sister     Past Surgical History:  Procedure Laterality Date   BIOPSY  02/20/2020   Procedure: BIOPSY;  Surgeon: Unk Corinn Skiff, MD;  Location: [facility];  Service: Endoscopy;;   BREAST BIOPSY Left    core- neg   BREAST BIOPSY Left    stereo- neg   COLONOSCOPY WITH PROPOFOL  N/A 02/20/2020   Procedure: COLONOSCOPY WITH PROPOFOL ;  Surgeon: Unk Corinn Skiff, MD;  Location: [facility];  Service: Endoscopy;  Laterality: N/A;   ECTOPIC PREGNANCY SURGERY     FOOT SURGERY Left    HARDWARE REMOVAL Right 12/01/2020   Procedure: HARDWARE REMOVAL (WRIST);  Surgeon: Cleotilde Barrio, MD;  Location: [facility];  Service: Orthopedics;  Laterality: Right;   ORIF RADIAL FRACTURE Right 02/18/2019   Procedure: OPEN REDUCTION INTERNAL FIXATION (ORIF) RADIAL FRACTURE;  Surgeon: Cleotilde Barrio, MD;  Location: [facility];  Service: Orthopedics;  Laterality: Right;   POLYPECTOMY  02/20/2020   Procedure: POLYPECTOMY;  Surgeon: Unk Corinn Skiff, MD;  Location: [facility];  Service: Endoscopy;;   WISDOM TOOTH EXTRACTION     Social History   Occupational History   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

## 2024-02-28 ENCOUNTER — Encounter: Payer: Self-pay | Admitting: Family Medicine

## 2024-02-29 MED ORDER — WEGOVY 0.5 MG/0.5ML ~~LOC~~ SOAJ: 0.5000 mg | SUBCUTANEOUS | 0 refills | Status: AC

## 2024-02-29 NOTE — Addendum Note (Signed)
 Addended by: EDMAN MARSA PARAS on: 02/29/2024 03:33 PM   Modules accepted: Orders

## 2024-03-07 ENCOUNTER — Other Ambulatory Visit (HOSPITAL_COMMUNITY): Payer: Self-pay

## 2024-05-16 ENCOUNTER — Ambulatory Visit: Payer: PRIVATE HEALTH INSURANCE | Admitting: Family Medicine
